# Patient Record
Sex: Female | Born: 1960 | Race: White | Hispanic: No | Marital: Married | Smoking: Never smoker
Health system: Southern US, Community
[De-identification: ages and names within clinical notes are randomized; demographics above are authoritative.]

## PROBLEM LIST (undated history)

## (undated) DIAGNOSIS — F419 Anxiety disorder, unspecified: Secondary | ICD-10-CM

## (undated) DIAGNOSIS — E039 Hypothyroidism, unspecified: Secondary | ICD-10-CM

## (undated) DIAGNOSIS — E611 Iron deficiency: Secondary | ICD-10-CM

## (undated) DIAGNOSIS — I4729 Other ventricular tachycardia: Secondary | ICD-10-CM

## (undated) DIAGNOSIS — G459 Transient cerebral ischemic attack, unspecified: Secondary | ICD-10-CM

## (undated) DIAGNOSIS — E785 Hyperlipidemia, unspecified: Secondary | ICD-10-CM

## (undated) DIAGNOSIS — I10 Essential (primary) hypertension: Secondary | ICD-10-CM

## (undated) DIAGNOSIS — D649 Anemia, unspecified: Secondary | ICD-10-CM

## (undated) DIAGNOSIS — R918 Other nonspecific abnormal finding of lung field: Secondary | ICD-10-CM

## (undated) DIAGNOSIS — I639 Cerebral infarction, unspecified: Secondary | ICD-10-CM

## (undated) DIAGNOSIS — K219 Gastro-esophageal reflux disease without esophagitis: Secondary | ICD-10-CM

## (undated) DIAGNOSIS — F329 Major depressive disorder, single episode, unspecified: Secondary | ICD-10-CM

## (undated) DIAGNOSIS — E0500 Thyrotoxicosis with diffuse goiter without thyrotoxic crisis or storm: Secondary | ICD-10-CM

## (undated) DIAGNOSIS — K449 Diaphragmatic hernia without obstruction or gangrene: Secondary | ICD-10-CM

## (undated) DIAGNOSIS — I4891 Unspecified atrial fibrillation: Secondary | ICD-10-CM

## (undated) DIAGNOSIS — I472 Ventricular tachycardia: Secondary | ICD-10-CM

## (undated) DIAGNOSIS — F32A Depression, unspecified: Secondary | ICD-10-CM

## (undated) HISTORY — DX: Cerebral infarction, unspecified: I63.9

## (undated) HISTORY — DX: Essential (primary) hypertension: I10

## (undated) HISTORY — DX: Depression, unspecified: F32.A

## (undated) HISTORY — DX: Anxiety disorder, unspecified: F41.9

## (undated) HISTORY — DX: Gastro-esophageal reflux disease without esophagitis: K21.9

## (undated) HISTORY — PX: CARPAL TUNNEL RELEASE: SHX101

## (undated) HISTORY — DX: Hyperlipidemia, unspecified: E78.5

## (undated) HISTORY — DX: Unspecified atrial fibrillation: I48.91

---
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
## 1978-11-06 HISTORY — PX: WISDOM TOOTH EXTRACTION: SHX21

## 1989-11-06 HISTORY — PX: CARPAL TUNNEL RELEASE: SHX101

## 1998-06-07 ENCOUNTER — Other Ambulatory Visit: Admission: RE | Admit: 1998-06-07 | Discharge: 1998-06-07 | Payer: Self-pay | Admitting: Obstetrics and Gynecology

## 1999-09-12 ENCOUNTER — Other Ambulatory Visit: Admission: RE | Admit: 1999-09-12 | Discharge: 1999-09-12 | Payer: Self-pay | Admitting: Obstetrics and Gynecology

## 2000-11-13 ENCOUNTER — Other Ambulatory Visit: Admission: RE | Admit: 2000-11-13 | Discharge: 2000-11-13 | Payer: Self-pay | Admitting: Obstetrics and Gynecology

## 2001-05-29 ENCOUNTER — Other Ambulatory Visit: Admission: RE | Admit: 2001-05-29 | Discharge: 2001-05-29 | Payer: Self-pay | Admitting: Obstetrics and Gynecology

## 2001-11-25 ENCOUNTER — Other Ambulatory Visit: Admission: RE | Admit: 2001-11-25 | Discharge: 2001-11-25 | Payer: Self-pay | Admitting: Obstetrics and Gynecology

## 2003-01-20 ENCOUNTER — Other Ambulatory Visit: Admission: RE | Admit: 2003-01-20 | Discharge: 2003-01-20 | Payer: Self-pay | Admitting: Obstetrics and Gynecology

## 2003-04-09 ENCOUNTER — Ambulatory Visit (HOSPITAL_COMMUNITY): Admission: RE | Admit: 2003-04-09 | Discharge: 2003-04-09 | Payer: Self-pay | Admitting: Obstetrics and Gynecology

## 2003-04-09 ENCOUNTER — Encounter (INDEPENDENT_AMBULATORY_CARE_PROVIDER_SITE_OTHER): Payer: Self-pay

## 2004-04-18 ENCOUNTER — Other Ambulatory Visit: Admission: RE | Admit: 2004-04-18 | Discharge: 2004-04-18 | Payer: Self-pay | Admitting: Obstetrics and Gynecology

## 2005-06-07 ENCOUNTER — Other Ambulatory Visit: Admission: RE | Admit: 2005-06-07 | Discharge: 2005-06-07 | Payer: Self-pay | Admitting: Obstetrics and Gynecology

## 2005-06-08 ENCOUNTER — Other Ambulatory Visit: Admission: RE | Admit: 2005-06-08 | Discharge: 2005-06-08 | Payer: Self-pay | Admitting: Obstetrics and Gynecology

## 2008-09-14 ENCOUNTER — Encounter (INDEPENDENT_AMBULATORY_CARE_PROVIDER_SITE_OTHER): Payer: Self-pay | Admitting: *Deleted

## 2008-10-06 ENCOUNTER — Ambulatory Visit: Payer: Self-pay | Admitting: Family Medicine

## 2008-10-06 DIAGNOSIS — Z8679 Personal history of other diseases of the circulatory system: Secondary | ICD-10-CM | POA: Insufficient documentation

## 2008-10-06 DIAGNOSIS — O99810 Abnormal glucose complicating pregnancy: Secondary | ICD-10-CM

## 2008-10-06 DIAGNOSIS — F419 Anxiety disorder, unspecified: Secondary | ICD-10-CM

## 2008-10-07 ENCOUNTER — Encounter: Payer: Self-pay | Admitting: Family Medicine

## 2008-10-27 ENCOUNTER — Ambulatory Visit: Payer: Self-pay | Admitting: Family Medicine

## 2008-10-27 LAB — CONVERTED CEMR LAB
Bilirubin Urine: NEGATIVE
Glucose, Urine, Semiquant: NEGATIVE
Ketones, urine, test strip: NEGATIVE
Nitrite: NEGATIVE
Protein, U semiquant: NEGATIVE
Specific Gravity, Urine: 1.01
Urobilinogen, UA: 0.2
WBC Urine, dipstick: NEGATIVE
pH: 5

## 2008-10-29 LAB — CONVERTED CEMR LAB
ALT: 18 units/L (ref 0–35)
AST: 16 units/L (ref 0–37)
Albumin: 3.6 g/dL (ref 3.5–5.2)
Alkaline Phosphatase: 82 units/L (ref 39–117)
BUN: 11 mg/dL (ref 6–23)
Basophils Absolute: 0 10*3/uL (ref 0.0–0.1)
Basophils Relative: 0.4 % (ref 0.0–3.0)
Bilirubin, Direct: 0.1 mg/dL (ref 0.0–0.3)
CO2: 28 meq/L (ref 19–32)
Calcium: 8.9 mg/dL (ref 8.4–10.5)
Chloride: 106 meq/L (ref 96–112)
Cholesterol: 199 mg/dL (ref 0–200)
Creatinine, Ser: 0.7 mg/dL (ref 0.4–1.2)
Eosinophils Absolute: 0.1 10*3/uL (ref 0.0–0.7)
Eosinophils Relative: 1.8 % (ref 0.0–5.0)
GFR calc Af Amer: 114 mL/min
GFR calc non Af Amer: 95 mL/min
Glucose, Bld: 95 mg/dL (ref 70–99)
HCT: 36.7 % (ref 36.0–46.0)
HDL: 30.1 mg/dL — ABNORMAL LOW (ref >39.0)
Hemoglobin: 12.7 g/dL (ref 12.0–15.0)
LDL Cholesterol: 135 mg/dL — ABNORMAL HIGH (ref 0–99)
Lymphocytes Relative: 22.5 % (ref 12.0–46.0)
MCHC: 34.6 g/dL (ref 30.0–36.0)
MCV: 89.6 fL (ref 78.0–100.0)
Monocytes Absolute: 0.5 10*3/uL (ref 0.1–1.0)
Monocytes Relative: 7.8 % (ref 3.0–12.0)
Neutro Abs: 4.7 10*3/uL (ref 1.4–7.7)
Neutrophils Relative %: 67.5 % (ref 43.0–77.0)
Platelets: 273 10*3/uL (ref 150–400)
Potassium: 3.7 meq/L (ref 3.5–5.1)
RBC: 4.1 M/uL (ref 3.87–5.11)
RDW: 12.4 % (ref 11.5–14.6)
Sodium: 141 meq/L (ref 135–145)
TSH: 2.67 microintl units/mL (ref 0.35–5.50)
Total Bilirubin: 0.6 mg/dL (ref 0.3–1.2)
Total CHOL/HDL Ratio: 6.6
Total Protein: 6.8 g/dL (ref 6.0–8.3)
Triglycerides: 168 mg/dL — ABNORMAL HIGH (ref 0–149)
VLDL: 34 mg/dL (ref 0–40)
WBC: 6.8 10*3/uL (ref 4.5–10.5)

## 2008-11-02 ENCOUNTER — Encounter (INDEPENDENT_AMBULATORY_CARE_PROVIDER_SITE_OTHER): Payer: Self-pay | Admitting: *Deleted

## 2009-03-26 ENCOUNTER — Telehealth (INDEPENDENT_AMBULATORY_CARE_PROVIDER_SITE_OTHER): Payer: Self-pay | Admitting: *Deleted

## 2009-06-02 ENCOUNTER — Ambulatory Visit: Payer: Self-pay | Admitting: Family Medicine

## 2009-06-06 LAB — CONVERTED CEMR LAB
ALT: 14 units/L (ref 0–35)
AST: 16 units/L (ref 0–37)
Albumin: 3.5 g/dL (ref 3.5–5.2)
Alkaline Phosphatase: 70 units/L (ref 39–117)
Bilirubin, Direct: 0.1 mg/dL (ref 0.0–0.3)
Cholesterol: 178 mg/dL (ref 0–200)
HDL: 27.5 mg/dL — ABNORMAL LOW (ref >39.00)
LDL Cholesterol: 134 mg/dL — ABNORMAL HIGH (ref 0–99)
Total Bilirubin: 0.8 mg/dL (ref 0.3–1.2)
Total CHOL/HDL Ratio: 6
Total Protein: 6.8 g/dL (ref 6.0–8.3)
Triglycerides: 81 mg/dL (ref 0.0–149.0)
VLDL: 16.2 mg/dL (ref 0.0–40.0)

## 2009-06-07 ENCOUNTER — Ambulatory Visit: Payer: Self-pay | Admitting: Family Medicine

## 2009-06-07 ENCOUNTER — Encounter (INDEPENDENT_AMBULATORY_CARE_PROVIDER_SITE_OTHER): Payer: Self-pay | Admitting: *Deleted

## 2009-06-07 DIAGNOSIS — E785 Hyperlipidemia, unspecified: Secondary | ICD-10-CM

## 2009-11-25 ENCOUNTER — Ambulatory Visit: Payer: Self-pay | Admitting: Family Medicine

## 2009-11-25 DIAGNOSIS — B373 Candidiasis of vulva and vagina: Secondary | ICD-10-CM

## 2009-11-25 LAB — CONVERTED CEMR LAB
Bilirubin Urine: NEGATIVE
Blood in Urine, dipstick: NEGATIVE
Glucose, Urine, Semiquant: NEGATIVE
Ketones, urine, test strip: NEGATIVE
Nitrite: NEGATIVE
Protein, U semiquant: NEGATIVE
Specific Gravity, Urine: 1.015
Urobilinogen, UA: 0.2
WBC Urine, dipstick: NEGATIVE
Whiff Test: NEGATIVE
pH: 5.5

## 2009-11-26 ENCOUNTER — Telehealth (INDEPENDENT_AMBULATORY_CARE_PROVIDER_SITE_OTHER): Payer: Self-pay | Admitting: *Deleted

## 2009-11-26 LAB — CONVERTED CEMR LAB
Chlamydia, DNA Probe: NEGATIVE
GC Probe Amp, Genital: NEGATIVE

## 2009-11-29 LAB — CONVERTED CEMR LAB

## 2009-12-06 LAB — CONVERTED CEMR LAB
Basophils Absolute: 0 10*3/uL (ref 0.0–0.1)
CO2: 24 meq/L (ref 19–32)
Calcium: 8.8 mg/dL (ref 8.4–10.5)
Eosinophils Absolute: 0.1 10*3/uL (ref 0.0–0.7)
GFR calc non Af Amer: 112.2 mL/min (ref >60)
HCT: 35.9 % — ABNORMAL LOW (ref 36.0–46.0)
Lymphs Abs: 1.1 10*3/uL (ref 0.7–4.0)
Monocytes Relative: 6.5 % (ref 3.0–12.0)
Platelets: 254 10*3/uL (ref 150.0–400.0)
RDW: 13.2 % (ref 11.5–14.6)
Sodium: 139 meq/L (ref 135–145)

## 2010-02-25 ENCOUNTER — Encounter: Admission: RE | Admit: 2010-02-25 | Discharge: 2010-02-25 | Payer: Self-pay | Admitting: Obstetrics and Gynecology

## 2010-04-27 ENCOUNTER — Ambulatory Visit: Payer: Self-pay | Admitting: Internal Medicine

## 2010-04-27 DIAGNOSIS — M545 Low back pain: Secondary | ICD-10-CM | POA: Insufficient documentation

## 2010-04-27 DIAGNOSIS — IMO0002 Reserved for concepts with insufficient information to code with codable children: Secondary | ICD-10-CM | POA: Insufficient documentation

## 2010-05-05 ENCOUNTER — Telehealth: Payer: Self-pay | Admitting: Family Medicine

## 2010-11-04 ENCOUNTER — Encounter: Payer: Self-pay | Admitting: Family Medicine

## 2010-11-04 ENCOUNTER — Telehealth: Payer: Self-pay | Admitting: Family Medicine

## 2010-11-06 HISTORY — PX: THYROID SURGERY: SHX805

## 2010-11-22 ENCOUNTER — Ambulatory Visit
Admission: RE | Admit: 2010-11-22 | Discharge: 2010-11-22 | Payer: Self-pay | Source: Home / Self Care | Attending: Family Medicine | Admitting: Family Medicine

## 2010-11-22 ENCOUNTER — Encounter: Payer: Self-pay | Admitting: Family Medicine

## 2010-11-22 DIAGNOSIS — D239 Other benign neoplasm of skin, unspecified: Secondary | ICD-10-CM | POA: Insufficient documentation

## 2010-11-23 ENCOUNTER — Encounter: Payer: Self-pay | Admitting: Family Medicine

## 2010-11-23 ENCOUNTER — Ambulatory Visit
Admission: RE | Admit: 2010-11-23 | Discharge: 2010-11-23 | Payer: Self-pay | Source: Home / Self Care | Attending: Family Medicine | Admitting: Family Medicine

## 2010-11-23 ENCOUNTER — Encounter (INDEPENDENT_AMBULATORY_CARE_PROVIDER_SITE_OTHER): Payer: Self-pay | Admitting: *Deleted

## 2010-11-23 ENCOUNTER — Other Ambulatory Visit: Payer: Self-pay | Admitting: Family Medicine

## 2010-11-23 LAB — CBC WITH DIFFERENTIAL/PLATELET
Basophils Absolute: 0 10*3/uL (ref 0.0–0.1)
Basophils Relative: 0.4 % (ref 0.0–3.0)
Eosinophils Absolute: 0 10*3/uL (ref 0.0–0.7)
Eosinophils Relative: 0.8 % (ref 0.0–5.0)
HCT: 35.4 % — ABNORMAL LOW (ref 36.0–46.0)
Hemoglobin: 12 g/dL (ref 12.0–15.0)
Lymphocytes Relative: 18 % (ref 12.0–46.0)
Lymphs Abs: 1.1 10*3/uL (ref 0.7–4.0)
MCHC: 33.9 g/dL (ref 30.0–36.0)
MCV: 89.4 fl (ref 78.0–100.0)
Monocytes Absolute: 0.4 10*3/uL (ref 0.1–1.0)
Monocytes Relative: 6.8 % (ref 3.0–12.0)
Neutro Abs: 4.5 10*3/uL (ref 1.4–7.7)
Neutrophils Relative %: 74 % (ref 43.0–77.0)
Platelets: 252 10*3/uL (ref 150.0–400.0)
RBC: 3.96 Mil/uL (ref 3.87–5.11)
RDW: 14 % (ref 11.5–14.6)
WBC: 6.1 10*3/uL (ref 4.5–10.5)

## 2010-11-23 LAB — HEPATIC FUNCTION PANEL
ALT: 12 U/L (ref 0–35)
AST: 13 U/L (ref 0–37)
Albumin: 3.7 g/dL (ref 3.5–5.2)
Alkaline Phosphatase: 64 U/L (ref 39–117)
Bilirubin, Direct: 0.1 mg/dL (ref 0.0–0.3)
Total Bilirubin: 0.4 mg/dL (ref 0.3–1.2)
Total Protein: 6.6 g/dL (ref 6.0–8.3)

## 2010-11-23 LAB — BASIC METABOLIC PANEL
BUN: 9 mg/dL (ref 6–23)
CO2: 26 mEq/L (ref 19–32)
Calcium: 9.2 mg/dL (ref 8.4–10.5)
Chloride: 106 mEq/L (ref 96–112)
Creatinine, Ser: 0.7 mg/dL (ref 0.4–1.2)
GFR: 100.12 mL/min (ref >60.00)
Glucose, Bld: 93 mg/dL (ref 70–99)
Potassium: 4 mEq/L (ref 3.5–5.1)
Sodium: 140 mEq/L (ref 135–145)

## 2010-11-23 LAB — LIPID PANEL
Cholesterol: 179 mg/dL (ref 0–200)
HDL: 34 mg/dL — ABNORMAL LOW (ref >39.00)
LDL Cholesterol: 130 mg/dL — ABNORMAL HIGH (ref 0–99)
Total CHOL/HDL Ratio: 5
Triglycerides: 76 mg/dL (ref 0.0–149.0)
VLDL: 15.2 mg/dL (ref 0.0–40.0)

## 2010-11-23 LAB — TSH: TSH: 1.88 u[IU]/mL (ref 0.35–5.50)

## 2010-11-25 ENCOUNTER — Telehealth (INDEPENDENT_AMBULATORY_CARE_PROVIDER_SITE_OTHER): Payer: Self-pay | Admitting: *Deleted

## 2010-11-25 LAB — CONVERTED CEMR LAB: HIV: NONREACTIVE

## 2010-11-28 ENCOUNTER — Telehealth: Payer: Self-pay | Admitting: Family Medicine

## 2010-12-06 NOTE — Assessment & Plan Note (Signed)
Summary: yeast infection//fd   Vital Signs:  Patient profile:   50 year old female Weight:      174 pounds Temp:     98.4 degrees F oral Pulse rate:   74 / minute Pulse rhythm:   regular BP sitting:   122 / 80  (left arm) Cuff size:   regular  Vitals Entered By: Army Fossa CMA (November 25, 2009 9:37 AM) CC: Pt c/o possible yeast infection, has tried [employer]. , Vaginal discharge   History of Present Illness:  Vaginal discharge      This is a 50 year old woman who presents with Vaginal discharge.  The symptoms began 2 weeks ago.  Pt recently became sexually active after several years.   + ext errythema and itching.   Pt used monistat3 x 2 .  The patient complains of itching and vaginal burning, but denies burning on urination, frequency, urgency, fever, pelvic pain, and back pain.  The discharge is described as white.  Risk factors for vaginal discharge include possible STD exposure.  The patient denies the following symptoms: genital sores, unusual vaginal bleeding, painful intercourse, rash, myalgias, arthralgias, and headache.  Prior treatment has included OTC antifungals.    Current Medications (verified): 1)  Alprazolam 0.5 Mg Tabs (Alprazolam) .... Take 1/2 or 1 Tab By Mouth Two Times A Day As Needed 2)  Celexa 10 Mg Tabs (Citalopram Hydrobromide) .Marland Kitchen.. 1 By Mouth Once Daily 3)  Diflucan 150 Mg Tabs (Fluconazole) .Marland Kitchen.. 1 By Mouth X1 ,  May Repeat in 3 Days As Needed 4)  Metrogel-Vaginal 0.75 % Gel (Metronidazole) .Marland Kitchen.. 1 Applicator At Bedtime 5)  Loestrin 24 Fe 1-20 Mg-Mcg Tabs (Norethin Ace-Eth Estrad-Fe)  Allergies (verified): No Known Drug Allergies  Past History:  Family History: Last updated: 10/06/2008 DM --Maternal GM sister CAD-DAD,PGF,PATERNAL UNCLE Family History High cholesterol-MOTHER Family History of CAD Female 1st degree relative <50 F--a fib  Social History: Last updated: 10/06/2008 Occupation: HS teacher Divorced Never Smoked Alcohol use-yes Drug  use-no Regular exercise-no  Risk Factors: Alcohol Use: <1 (10/06/2008) Caffeine Use: 1 (10/06/2008) Exercise: no (10/06/2008)  Risk Factors: Smoking Status: never (10/06/2008) Passive Smoke Exposure: no (10/06/2008)  Past medical, surgical, family and social histories (including risk factors) reviewed for relevance to current acute and chronic problems.  Past Medical History: Reviewed history from 06/07/2009 and no changes required. Anxiety GDM Hyperlipidemia  Past Surgical History: Reviewed history from 10/06/2008 and no changes required. Carpal tunnel release  Family History: Reviewed history from 10/06/2008 and no changes required. DM --Maternal GM sister CAD-DAD,PGF,PATERNAL UNCLE Family History High cholesterol-MOTHER Family History of CAD Female 1st degree relative <50 F--a fib  Social History: Reviewed history from 10/06/2008 and no changes required. Occupation: HS teacher Divorced Never Smoked Alcohol use-yes Drug use-no Regular exercise-no  Review of Systems      See HPI  Physical Exam  General:  Well-developed,well-nourished,in no acute distress; alert,appropriate and cooperative throughout examination Abdomen:  Bowel sounds positive,abdomen soft and non-tender without masses, organomegaly or hernias noted. Genitalia:  labial erythema and vaginal discharge.   Psych:  Oriented X3 and normally interactive.     Impression & Recommendations:  Problem # 1:  CANDIDIASIS OF VULVA AND VAGINA (ICD-112.1)  Her updated medication list for this problem includes:    Diflucan 150 Mg Tabs (Fluconazole) .Marland Kitchen... 1 by mouth x1 ,  may repeat in 3 days as needed  Orders: Wet Prep (47829FA) T-Chlamydia Probe, genital (21308-65784) T-GC Probe, genital (69629-52841) Venipuncture (32440) TLB-CBC Platelet -  w/Differential (85025-CBCD) TLB-BMP (Basic Metabolic Panel-BMET) (80048-METABOL) T-HIV Antibody  (Reflex) [id number]) T-RPR (Syphilis) [phone]) T- * Misc.  Laboratory test [phone])  Discussed treatment regimen and preventive measures.   Complete Medication List: 1)  Alprazolam 0.5 Mg Tabs (Alprazolam) .... Take 1/2 or 1 tab by mouth two times a day as needed 2)  Celexa 10 Mg Tabs (Citalopram hydrobromide) .Marland Kitchen.. 1 by mouth once daily 3)  Diflucan 150 Mg Tabs (Fluconazole) .Marland Kitchen.. 1 by mouth x1 ,  may repeat in 3 days as needed 4)  Metrogel-vaginal 0.75 % Gel (Metronidazole) .Marland Kitchen.. 1 applicator at bedtime 5)  Loestrin 24 Fe 1-20 Mg-mcg Tabs (Norethin ace-eth estrad-fe) Prescriptions: METROGEL-VAGINAL 0.75 % GEL (METRONIDAZOLE) 1 applicator at bedtime  #5 x 0   Entered and Authorized by:   Loreen Freud DO   Signed by:   Loreen Freud DO on 11/25/2009   Method used:   Print then Give to Patient   RxID:   [id number] DIFLUCAN 150 MG TABS (FLUCONAZOLE) 1 by mouth x1 ,  May repeat in 3 days as needed  #2 x 3   Entered and Authorized by:   Loreen Freud DO   Signed by:   Loreen Freud DO on 11/25/2009   Method used:   [facility]        [facility]  [address] [phone][facility])       [address]       [place], [place]  [address]       Ph: [phone] or [phone]       Fax: [phone]   RxID:   [id number]   Laboratory Results   Urine Tests    Routine Urinalysis   Color: yellow Appearance: Clear Glucose: negative   (Normal Range: Negative) Bilirubin: negative   (Normal Range: Negative) Ketone: negative   (Normal Range: Negative) Spec. Gravity: 1.015   (Normal Range: 1.003-1.035) Blood: negative   (Normal Range: Negative) pH: 5.5   (Normal Range: 5.0-8.0) Protein: negative   (Normal Range: Negative) Urobilinogen: 0.2   (Normal Range: 0-1) Nitrite: negative   (Normal Range: Negative) Leukocyte Esterace: negative   (Normal Range: Negative)    CommentsArmy Fossa CMA  November 25, 2009 9:44 AM Date/Time Received: November 25, 2009 10:05 AM  Date/Time Reported: November 25, 2009 10:05 AM   Wet [place] Source:  vaginal WBC/hpf: 1-5 Bacteria/hpf: rare Clue cells/hpf: few  Negative whiff Yeast/hpf: moderate Trichomonas/hpf: none

## 2010-12-06 NOTE — Progress Notes (Signed)
Summary: lab results  Phone Note Outgoing Call   Call placed by: [facility] CMA,  November 26, 2009 10:11 AM Summary of Call: left message to call office. ...............Marland KitchenFelecia Deloach CMA  November 26, 2009 10:10 AM   HIV 1,2 Antibody, with Reflex,Chlamydia and GC Probe Amp, Genital: negative  pt aware.......................Marland KitchenFelecia Deloach CMA  November 26, 2009 3:58 PM

## 2010-12-06 NOTE — Progress Notes (Signed)
Summary: BACK PAIN MEDICATION  Phone Note Call from Patient Call back at [phone]   Caller: Patient Summary of Call: PT CALLS IN STATING THAT SHE SEEN DR. HOPPER LAST WEEK REGARDING HER BACK PAIN. SHE WAS GIVEN HYDROCODONE TO HELP HER BUT SHE IS NOT ABLE TO TAKE  IT DURING THE DAY BECAUSE SHE CAN NOT FUNCTION WHILE SHES ON THE MEDICATION. SHE SAYS IT MAKES HER VERY TIRED. IS THERE SOMETHING ELSE THAT CAN BE CALLED IN FOR HER? SHE WOULD LIKE TO KNOW IF IT IS POSSIBLE FOR HER TO TRY VOLTAREN? PLEASE CONTACT PT AT [phone] Initial call taken by: Lavell Islam,  May 05, 2010 12:06 PM  Follow-up for Phone Call        dr Beverely Low please advise on alternate med....Marland KitchenMarland KitchenFelecia Deloach CMA  May 05, 2010 12:35 PM   Additional Follow-up for Phone Call Additional follow up Details #1::        ok for voltaren 50mg  two times a day x7-10 days and then as needed.  #30, no refills.  no additional ibuprofen, motrin, aleve, advil, etc- can add tylenol as needed. Additional Follow-up by: Neena Rhymes MD,  May 05, 2010 1:24 PM    Additional Follow-up for Phone Call Additional follow up Details #2::    Spoke with pt, she is aware. Army Fossa CMA  May 05, 2010 2:03 PM   New/Updated Medications: DICLOFENAC SODIUM 50 MG TBEC (DICLOFENAC SODIUM) two times a day x7-10 days and then as needed. Prescriptions: DICLOFENAC SODIUM 50 MG TBEC (DICLOFENAC SODIUM) two times a day x7-10 days and then as needed.  #30 x 0   Entered by:   Army Fossa CMA   Authorized by:   Neena Rhymes MD   Signed by:   Army Fossa CMA on 05/05/2010   Method used:   [facility]        [facility]  [address] [phone][facility] (retail[facility]       [address]       [place], [place]  [address]       Ph: [phone] or [phone]       Fax: [phone]   RxID:   [id number]

## 2010-12-06 NOTE — Assessment & Plan Note (Signed)
Summary: back pain/cbs   Vital Signs:  Patient profile:   50 year old female Height:      68 inches Weight:      181.25 pounds BMI:     27.66 Temp:     98.8 degrees F Pulse rate:   88 / minute Pulse rhythm:   regular Resp:     16 per minute BP sitting:   128 / 80  (left arm) Cuff size:   regular  Vitals Entered By: Army Fossa CMA (April 27, 2010 1:47 PM) CC: Pt here she picked up a 35 lb bag of dog food last week and hurt her lower back and it is radiating down into her leg. Has been icing and using ibruprofen., Back pain   Primary Care Provider:  Laury Axon  CC:  Pt here she picked up a 35 lb bag of dog food last week and hurt her lower back and it is radiating down into her leg. Has been icing and using ibruprofen. and Back pain.  History of Present Illness:  Back Pain      This is a 50 year old woman who presents with Back pain X 5 days.  The patient reports  pain even @ rest, but denies fever, chills, weakness, loss of sensation, fecal incontinence, urinary incontinence, urinary retention, dysuria, and inability to care for self.  The pain is located in the mid- L  low back.  The pain began at home, suddenly, and after lifting a 35# bag of dogfood.  The pain radiates to the left hip/thigh.  The pain is made worse by standing or walking and flexion.  The pain is made better by inactivity, NSAID medications,  ice & Chiropractry (3 visits).  Risk factors for serious underlying conditions include age >= 50 years and history of scoliosis.    Allergies (verified): No Known Drug Allergies  Review of Systems General:  Denies chills and sweats. GU:  Denies discharge; Having menses. Derm:  Denies lesion(s) and rash. Neuro:  Denies brief paralysis, numbness, tingling, and weakness. Heme:  Denies abnormal bruising and bleeding.  Physical Exam  General:  Uncomfortable but in no acute distress; alert,appropriate and cooperative throughout examination Abdomen:  Bowel sounds  positive,abdomen soft and non-tender without masses, organomegaly or hernias noted. Msk:  No deformity or scoliosis noted of thoracic or lumbar spine.  Classic "low back" crawl sitting up & lying down . No pain to percussion Pulses:  R and L dorsalis pedis and posterior tibial pulses are full and equal bilaterally Extremities:  No clubbing, cyanosis, edema, or deformity noted with normal full range of motion of all joints.  Neg SLR  Neurologic:  alert & oriented X3, strength normal in all extremities, gait(heel/toe) normal, and DTRs symmetrical and normal.   Skin:  Intact without suspicious lesions or rashes Psych:  memory intact for recent and remote, normally interactive, and good eye contact.     Impression & Recommendations:  Problem # 1:  LOW BACK PAIN, ACUTE (ICD-724.2)  Her updated medication list for this problem includes:    Cyclobenzaprine Hcl 5 Mg Tabs (Cyclobenzaprine hcl) .Marland Kitchen... 1 two times a day & 1-2 at bedtime as needed    Hydrocodone-acetaminophen 5-500 Mg Tabs (Hydrocodone-acetaminophen) .Marland Kitchen... 1-2 every 4-6 hrs as needed for pain  Problem # 2:  LUMBAR RADICULOPATHY, LEFT (ICD-724.4)   L4 distribution  Her updated medication list for this problem includes:    Cyclobenzaprine Hcl 5 Mg Tabs (Cyclobenzaprine hcl) .Marland Kitchen... 1 two times a  day & 1-2 at bedtime as needed    Hydrocodone-acetaminophen 5-500 Mg Tabs (Hydrocodone-acetaminophen) .Marland Kitchen... 1-2 every 4-6 hrs as needed for pain  Complete Medication List: 1)  Alprazolam 0.5 Mg Tabs (Alprazolam) .... Take 1/2 or 1 tab by mouth two times a day as needed 2)  Celexa 10 Mg Tabs (Citalopram hydrobromide) .Marland Kitchen.. 1 by mouth once daily 3)  Cyclobenzaprine Hcl 5 Mg Tabs (Cyclobenzaprine hcl) .Marland Kitchen.. 1 two times a day & 1-2 at bedtime as needed 4)  Hydrocodone-acetaminophen 5-500 Mg Tabs (Hydrocodone-acetaminophen) .Marland Kitchen.. 1-2 every 4-6 hrs as needed for pain  Patient Instructions: 1)  Meds may cause sleepiness. Stretching exercises as  discussed Prescriptions: HYDROCODONE-ACETAMINOPHEN 5-500 MG TABS (HYDROCODONE-ACETAMINOPHEN) 1-2 every 4-6 hrs as needed for pain  #30 x 0   Entered and Authorized by:   Marga Melnick MD   Signed by:   Marga Melnick MD on 04/27/2010   Method used:   Print then Give to Patient   RxID:   [id number] CYCLOBENZAPRINE HCL 5 MG TABS (CYCLOBENZAPRINE HCL) 1 two times a day & 1-2 at bedtime as needed  #20 x 0   Entered and Authorized by:   Marga Melnick MD   Signed by:   Marga Melnick MD on 04/27/2010   Method used:   Print then Give to Patient   RxID:   [id number]

## 2010-12-08 NOTE — Progress Notes (Signed)
Summary: Results   Phone Note Outgoing Call   Call placed by: Almeta Monas CMA Duncan Dull),  November 25, 2010 1:25 PM Call placed to: Patient Summary of Call: All STD labs ----wnl. rest of the labs are pending...... Left message to call back... Almeta Monas CMA Duncan Dull)  November 25, 2010 1:26 PM  Left message to call back.... Almeta Monas CMA Duncan Dull)  November 28, 2010 11:12 AM   Follow-up for Phone Call        Pt aware............Marland KitchenFelecia Deloach CMA  November 28, 2010 4:55 PM

## 2010-12-08 NOTE — Progress Notes (Signed)
Summary: DERMATOLOGY REFERRAL  Phone Note Call from Patient   Caller: Patient Call For: Loreen Freud DO Reason for Call: Referral Summary of Call: IN REFERENCE TO DERMATOLOGY REFERRAL, PER VOICEMAIL FROM PT, SHE ASKED I CXL THIS APPT DUE TO FINANCIAL REASONS, I HAVE CXLED Alexandria [name]  November 28, 2010 1:53 PM

## 2010-12-08 NOTE — Letter (Signed)
Summary: Pre Visit Letter Revised  [facility]  [address] [place], [place] [address]   Phone: [phone]  Fax: [phone]        11/23/2010 MRN: [id number] Alexandria [name] [address] [place], [place]  [address]             Procedure Date:  December 28, 2010   Dir Col-Dr Tomasita Morrow to the Gastroenterology Division at [facility].    You are scheduled to see a nurse for your pre-procedure visit on Februaryu 8, 2012 at 10:30am on the 3rd floor at [facility], [address]  We ask that you try to arrive at our office 15 minutes prior to your appointment time to allow for check-in.  Please take a minute to review the attached form.  If you answer "Yes" to one or more of the questions on the first page, we ask that you call the person listed at your earliest opportunity.  If you answer "No" to all of the questions, please complete the rest of the form and bring it to your appointment.    Your nurse visit will consist of discussing your medical and surgical history, your immediate family medical history, and your medications.   If you are unable to list all of your medications on the form, please bring the medication bottles to your appointment and we will list them.  We will need to be aware of both prescribed and over the counter drugs.  We will need to know exact dosage information as well.    Please be prepared to read and sign documents such as consent forms, a financial agreement, and acknowledgement forms.  If necessary, and with your consent, a friend or relative is welcome to sit-in on the nurse visit with you.  Please bring your insurance card so that we may make a copy of it.  If your insurance requires a referral to see a specialist, please bring your referral form from your primary care physician.  No co-pay is required for this nurse visit.     If you cannot keep your appointment, please call [phone] to cancel or  reschedule prior to your appointment date.  This allows [place] the opportunity to schedule an appointment for another patient in need of care.    Thank you for choosing [facility] for your medical needs.  We appreciate the opportunity to care for you.  Please visit [place] at our website  to learn more about our practice.  Sincerely, The Gastroenterology Division

## 2010-12-08 NOTE — Letter (Signed)
Summary: Primary Care Appointment Letter  [facility]  [address] [place], [place] [address]   Phone: [phone]  Fax: [phone]    11/04/2010 MRN: [id number]    Alexandria [name] [address] [place], [place]  [address]     Dear Ms. BECK,   Your Primary Care Physician Loreen Freud DO has indicated that:    ___X____it is time to schedule an appointment for you Annual physical with fasting Labs.    _______you missed your appointment on______ and need to call and          reschedule.    _______you need to have lab work done.    _______you need to schedule an appointment discuss lab or test results.    _______you need to call to reschedule your appointment that is                       scheduled on _________.     Please call our office as soon as possible. Our phone number is [phone]. Please press option 1. Our office is open 8a-5p, Monday through Friday.     Thank you,    [facility] Scheduler

## 2010-12-08 NOTE — Assessment & Plan Note (Signed)
Summary: cpx//lch   Vital Signs:  Patient profile:   50 year old female Height:      66.75 inches Weight:      169.8 pounds Pulse rate:   60 / minute Pulse rhythm:   regular BP sitting:   130 / 80 Cuff size:   regular  Vitals Entered By: Almeta Monas CMA Duncan Dull) (November 22, 2010 2:14 PM) CC: CPX/No pap needed--wants to discuss meds   History of Present Illness: Pt here for cpe.  Pt feels the celexa is no longer working.  She has just been through bad breakup and would like to be checked for STDs as well.     Preventive Screening-Counseling & Management  Alcohol-Tobacco     Alcohol drinks/day: <1     Alcohol type: WINE     Smoking Status: never     Passive Smoke Exposure: no  Caffeine-Diet-Exercise     Caffeine use/day: 1     Does Patient Exercise: no     Exercise Counseling: not indicated; exercise is adequate  Hep-HIV-STD-Contraception     HIV Risk: no     Sun Exposure-Excessive: occasionally  Safety-Violence-Falls     Seat Belt Use: 100      Sexual History:  single.    Problems Prior to Update: 1)  Preventive Health Care  (ICD-V70.0) 2)  Nevi, Multiple  (ICD-216.9) 3)  Preventive Health Care  (ICD-V70.0) 4)  Sexual Activity, High Risk  (ICD-V69.2) 5)  Lumbar Radiculopathy, Left  (ICD-724.4) 6)  Low Back Pain, Acute  (ICD-724.2) 7)  Candidiasis of Vulva and Vagina  (ICD-112.1) 8)  Hyperlipidemia  (ICD-272.4) 9)  Palpitations, Hx of  (ICD-V12.50) 10)  Family History of Cad Female 1st Degree Relative <50  (ICD-V17.3) 11)  Gestational Diabetes  (ICD-648.80) 12)  Anxiety  (ICD-300.00)  Medications Prior to Update: 1)  Alprazolam 0.5 Mg Tabs (Alprazolam) .... Take 1/2 or 1 Tab By Mouth Two Times A Day As Needed 2)  Celexa 10 Mg Tabs (Citalopram Hydrobromide) .Marland Kitchen.. 1 By Mouth Once Daily 3)  Cyclobenzaprine Hcl 5 Mg Tabs (Cyclobenzaprine Hcl) .Marland Kitchen.. 1 Two Times A Day & 1-2 At Bedtime As Needed 4)  Hydrocodone-Acetaminophen 5-500 Mg Tabs (Hydrocodone-Acetaminophen)  .Marland Kitchen.. 1-2 Every 4-6 Hrs As Needed For Pain 5)  Diclofenac Sodium 50 Mg Tbec (Diclofenac Sodium) .... Two Times A Day X7-10 Days and Then As Needed.  Current Medications (verified): 1)  Alprazolam 0.5 Mg Tabs (Alprazolam) .... Take 1/2 or 1 Tab By Mouth Two Times A Day As Needed 2)  Celexa 20 Mg Tabs (Citalopram Hydrobromide) .Marland Kitchen.. 1 By Mouth Once Daily 3)  Diclofenac Sodium 75 Mg Tbec (Diclofenac Sodium) .Marland Kitchen.. 1 By Mouth Two Times A Day As Needed  Allergies (verified): No Known Drug Allergies  Past History:  Past Medical History: Last updated: 06/07/2009 Anxiety GDM Hyperlipidemia  Past Surgical History: Last updated: 10/06/2008 Carpal tunnel release  Family History: Last updated: 10/06/2008 DM --Maternal GM sister CAD-DAD,PGF,PATERNAL UNCLE Family History High cholesterol-MOTHER Family History of CAD Female 1st degree relative <50 F--a fib  Social History: Last updated: 10/06/2008 Occupation: HS teacher Divorced Never Smoked Alcohol use-yes Drug use-no Regular exercise-no  Risk Factors: Alcohol Use: <1 (11/22/2010) Caffeine Use: 1 (11/22/2010) Exercise: no (11/22/2010)  Risk Factors: Smoking Status: never (11/22/2010) Passive Smoke Exposure: no (11/22/2010)  Family History: Reviewed history from 10/06/2008 and no changes required. DM --Maternal GM sister CAD-DAD,PGF,PATERNAL UNCLE Family History High cholesterol-MOTHER Family History of CAD Female 1st degree relative <50 F--a fib  Social  History: Reviewed history from 10/06/2008 and no changes required. Occupation: HS teacher Divorced Never Smoked Alcohol use-yes Drug use-no Regular exercise-no Sexual History:  single  Review of Systems      See HPI General:  Denies chills, fatigue, fever, loss of appetite, malaise, sleep disorder, sweats, weakness, and weight loss. Eyes:  Denies blurring, discharge, double vision, eye irritation, eye pain, halos, itching, light sensitivity, red eye, vision loss-1 eye,  and vision loss-both eyes; optho-- due. ENT:  Denies decreased hearing, difficulty swallowing, ear discharge, earache, hoarseness, nasal congestion, nosebleeds, postnasal drainage, ringing in ears, sinus pressure, and sore throat. CV:  Denies bluish discoloration of lips or nails, chest pain or discomfort, difficulty breathing at night, difficulty breathing while lying down, fainting, fatigue, leg cramps with exertion, lightheadness, near fainting, palpitations, shortness of breath with exertion, swelling of feet, swelling of hands, and weight gain. Resp:  Denies chest discomfort, chest pain with inspiration, cough, coughing up blood, excessive snoring, hypersomnolence, morning headaches, pleuritic, shortness of breath, sputum productive, and wheezing. GI:  Denies abdominal pain, bloody stools, change in bowel habits, constipation, dark tarry stools, diarrhea, excessive appetite, gas, hemorrhoids, indigestion, loss of appetite, nausea, vomiting, vomiting blood, and yellowish skin color. GU:  Denies abnormal vaginal bleeding, decreased libido, discharge, dysuria, genital sores, hematuria, incontinence, nocturia, urinary frequency, and urinary hesitancy. MS:  Denies joint pain, joint redness, joint swelling, loss of strength, low back pain, mid back pain, muscle aches, muscle , cramps, muscle weakness, stiffness, and thoracic pain. Derm:  Denies changes in color of skin, changes in nail beds, dryness, excessive perspiration, flushing, hair loss, insect bite(s), itching, lesion(s), poor wound healing, and rash. Neuro:  Denies brief paralysis, difficulty with concentration, disturbances in coordination, falling down, headaches, inability to speak, memory loss, numbness, poor balance, seizures, sensation of room spinning, tingling, tremors, visual disturbances, and weakness. Psych:  Complains of depression; denies alternate hallucination ( auditory/visual), anxiety, easily angered, easily tearful, irritability,  mental problems, panic attacks, sense of great danger, suicidal thoughts/plans, thoughts of violence, unusual visions or sounds, and thoughts /plans of harming others. Endo:  Denies cold intolerance, excessive hunger, excessive thirst, excessive urination, heat intolerance, polyuria, and weight change. Heme:  Denies abnormal bruising, bleeding, enlarge lymph nodes, fevers, pallor, and skin discoloration. Allergy:  Denies hives or rash, itching eyes, persistent infections, seasonal allergies, and sneezing.  Physical Exam  General:  Well-developed,well-nourished,in no acute distress; alert,appropriate and cooperative throughout examination Head:  Normocephalic and atraumatic without obvious abnormalities. No apparent alopecia or balding. Eyes:  pupils equal, pupils round, pupils reactive to light, and no injection.   Ears:  External ear exam shows no significant lesions or deformities.  Otoscopic examination reveals clear canals, tympanic membranes are intact bilaterally without bulging, retraction, inflammation or discharge. Hearing is grossly normal bilaterally. Nose:  External nasal examination shows no deformity or inflammation. Nasal mucosa are pink and moist without lesions or exudates. Mouth:  Oral mucosa and oropharynx without lesions or exudates.  Teeth in good repair. Neck:  No deformities, masses, or tenderness noted. Chest Wall:  No deformities, masses, or tenderness noted. Breasts:  No mass, nodules, thickening, tenderness, bulging, retraction, inflamation, nipple discharge or skin changes noted.   Lungs:  Normal respiratory effort, chest expands symmetrically. Lungs are clear to auscultation, no crackles or wheezes. Heart:  normal rate and no murmur.   Abdomen:  Bowel sounds positive,abdomen soft and non-tender without masses, organomegaly or hernias noted. Msk:  normal ROM, no joint tenderness, no joint swelling, no joint  warmth, no redness over joints, no joint deformities, no joint  instability, and no crepitation.   Pulses:  R posterior tibial normal, R dorsalis pedis normal, R carotid normal, L posterior tibial normal, L dorsalis pedis normal, and L carotid normal.   Extremities:  No clubbing, cyanosis, edema, or deformity noted with normal full range of motion of all joints.   Neurologic:  No cranial nerve deficits noted. Station and gait are normal. Plantar reflexes are down-going bilaterally. DTRs are symmetrical throughout. Sensory, motor and coordinative functions appear intact. Skin:  Intact without suspicious lesions or rashes Cervical Nodes:  No lymphadenopathy noted Axillary Nodes:  No palpable lymphadenopathy Psych:  Cognition and judgment appear intact. Alert and cooperative with normal attention span and concentration. No apparent delusions, illusions, hallucinations   Impression & Recommendations:  Problem # 1:  PREVENTIVE HEALTH CARE (ICD-V70.0) GHM utd  RTO labs  Orders: Gastroenterology Referral (GI) EKG w/ Interpretation (93000)  Problem # 2:  SEXUAL ACTIVITY, HIGH RISK (ICD-V69.2)  Orders: T-Chlamydia  Probe, urine (65784-69629) T-GC Probe, urine (52841-32440) EKG w/ Interpretation (93000)  Problem # 3:  ANXIETY (ICD-300.00)  Her updated medication list for this problem includes:    Alprazolam 0.5 Mg Tabs (Alprazolam) .Marland Kitchen... Take 1/2 or 1 tab by mouth two times a day as needed    Celexa 20 Mg Tabs (Citalopram hydrobromide) .Marland Kitchen... 1 by mouth once daily  Discussed medication use and relaxation techniques.   Orders: EKG w/ Interpretation (93000)  Problem # 4:  HYPERLIPIDEMIA (ICD-272.4)  Orders: EKG w/ Interpretation (93000)  Complete Medication List: 1)  Alprazolam 0.5 Mg Tabs (Alprazolam) .... Take 1/2 or 1 tab by mouth two times a day as needed 2)  Celexa 20 Mg Tabs (Citalopram hydrobromide) .Marland Kitchen.. 1 by mouth once daily 3)  Diclofenac Sodium 75 Mg Tbec (Diclofenac sodium) .Marland Kitchen.. 1 by mouth two times a day as needed  Other  Orders: Dermatology Referral (Derma)  Patient Instructions: 1)  V69.2  add HIV, RPR, HSV II glycoprotein to lab tomorrow Prescriptions: CELEXA 20 MG TABS (CITALOPRAM HYDROBROMIDE) 1 by mouth once daily  #30 x 0   Entered by:   Doristine Devoid CMA   Authorized by:   Loreen Freud DO   Signed by:   Doristine Devoid CMA on 11/22/2010   Method used:   [facility]        [facility]  [address] [phone]* (retail)       [address]       [place], [place]  [address]       Ph: [phone] or [phone]       Fax: [phone]   RxID:   [id number] CELEXA 20 MG TABS (CITALOPRAM HYDROBROMIDE) 1 by mouth once daily  #30 x 0   Entered and Authorized by:   Loreen Freud DO   Signed by:   Loreen Freud DO on 11/22/2010   Method used:   Historical   RxID:   [id number]    Orders Added: 1)  T-Chlamydia  Probe, urine [[id number]] 2)  T-GC Probe, urine [[id number]] 3)  Gastroenterology Referral [GI] 4)  Dermatology Referral [Derma] 5)  Est. Patient 40-64 years [99396] 6)  EKG w/ Interpretation [93000]     Last Flu Vaccine:  Fluvax 3+ (09/16/2008 3:24:41 PM) Flu Vaccine Next Due:  Refused PAP Result Date:  04/20/2010 PAP Result:  normal PAP Next Due:  1 yr Mammogram Result Date:  04/20/2010 Mammogram Result:  normal Mammogram Next Due:  1 yr

## 2010-12-08 NOTE — Progress Notes (Signed)
Summary: Alprazolam refill  Phone Note Refill Request Message from:  Fax from Pharmacy on November 04, 2010 4:22 PM  Refills Requested: Medication #1:  ALPRAZOLAM 0.5 MG TABS TAKE 1/2 OR 1 TAB by mouth two times a day as needed   Last Refilled: [facility]/10/2010 [id number], [address], [place], [place]  phone - [phone],   fax - [phone]   qty = 30  Next Appointment Scheduled: none Initial call taken by: Jerolyn Shin,  November 04, 2010 4:24 PM  Follow-up for Phone Call        last seen 04/27/10 for back pain, last cpx 12/09 and last filled 06/07/09, No upcoming appts. please advise Follow-up by: Almeta Monas CMA Duncan Dull),  November 04, 2010 4:39 PM  Additional Follow-up for Phone Call Additional follow up Details #1::        refill x1---pt needs ov cpe Additional Follow-up by: Loreen Freud DO,  November 04, 2010 4:41 PM    Prescriptions: ALPRAZOLAM 0.5 MG TABS (ALPRAZOLAM) TAKE 1/2 OR 1 TAB by mouth two times a day as needed  #30 x 0   Entered by:   Almeta Monas CMA (AAMA)   Authorized by:   Loreen Freud DO   Signed by:   Almeta Monas CMA (AAMA) on 11/04/2010   Method used:   Printed then faxed to ...         RxID:   [id number]

## 2010-12-28 ENCOUNTER — Other Ambulatory Visit: Payer: Self-pay | Admitting: Gastroenterology

## 2011-02-01 ENCOUNTER — Other Ambulatory Visit: Payer: Self-pay | Admitting: Family Medicine

## 2011-02-02 NOTE — Telephone Encounter (Signed)
Last seen and filled 11/22/10 please advise     KP

## 2011-03-24 NOTE — Op Note (Signed)
NAME:  Alexandria [name], Alexandria [name]                             ACCOUNT NO.:  [id number]   MEDICAL RECORD NO.:  [id number]                   PATIENT TYPE:  AMB   LOCATION:  [facility]                                  FACILITY:  [facility]   PHYSICIAN:  Malva Limes, M.D.                 DATE OF BIRTH:  May 16, 1961   DATE OF PROCEDURE:  04/09/2003  DATE OF DISCHARGE:                                 OPERATIVE REPORT   PREOPERATIVE DIAGNOSES:  1. Menorrhagia.  2. Known fibroid.   POSTOPERATIVE DIAGNOSES:  1. Menorrhagia.  2. Known fibroid.   PROCEDURES:  1. Hysteroscopy.  2. Dilation and curettage.  3. Cryoablation of endometrium.   SURGEON:  Malva Limes, M.D.   ANESTHESIA:  General.   ANTIBIOTICS:  Ancef 1 g.   DRAINS:  Red rubber catheter to the bladder.   SPECIMENS:  Endometrial curettings sent to pathology.   COMPLICATIONS:  None.   ESTIMATED BLOOD LOSS:  Minimal.   FINDINGS:  The patient had no evidence of a submucous fibroid.  The ostia  were visualized and freely mobile.  There was no evidence of any polyps.   PROCEDURE:  The patient was taken to the operating room and placed in the  dorsal supine position.  A general anesthetic was administered without  complications.  She was then placed in the dorsal lithotomy position.  She  was prepped with Hibiclens and draped in the usual fashion for this  procedure.  A sterile speculum was placed in the vagina.  A single-tooth  tenaculum was applied to the anterior cervical lip.  The cervix was then  dilated to a 31 [place].  The uterus was sounded to 11 cm.  The hysteroscope  was then advanced through the endocervical canal, which appeared to be  normal.  On examining the uterine cavity there was no evidence of polyps or  submucous fibroids.  Both ostia were easily seen.  The hysteroscope was then  removed and sharp curettage performed.  Following this the cryoablation  machine was set up and the cryoprobe was placed in the right cornu and  frozen for seven minutes.  The opposite side was then frozen for seven  minutes, and the lower uterine segment was frozen for  four minutes.  The patient tolerated the procedure well.  She was taken to  the recovery room in stable condition.  She will be discharged to home.  She  will be instructed to follow up in the office in four weeks.  She will be  sent home with Darvocet to take p.r.n.                                               Malva Limes, M.D.    MA/MEDQ  D:  04/09/2003  T:  04/10/2003  Job:  [id number]

## 2011-04-10 ENCOUNTER — Other Ambulatory Visit: Payer: Self-pay | Admitting: Family Medicine

## 2011-04-10 NOTE — Telephone Encounter (Signed)
It doesn't look like pt is taking it?  Please check with pt.

## 2011-04-10 NOTE — Telephone Encounter (Signed)
Last seen 11/22/10 and filled 02/01/11 # 30 with 0 refills--- Please advise    KP

## 2011-04-11 MED ORDER — CITALOPRAM HYDROBROMIDE 20 MG PO TABS: 20.0000 mg | ORAL_TABLET | Freq: Every day | ORAL | Status: DC

## 2011-04-11 NOTE — Telephone Encounter (Signed)
Ok to refill x1---- 5 refills 

## 2011-04-11 NOTE — Telephone Encounter (Signed)
Addended by: Arnette Norris on: 04/11/2011 03:09 PM   Modules accepted: Orders

## 2011-04-11 NOTE — Telephone Encounter (Signed)
Patient is cutting the Celexa 20mg  in half--she stated she is doing it that way because it is cheaper for her instead of filling 10 mg monthly. She stated she lost 25 lbs and did not want to gain the weight back so she did not want the increase that was discussed at the last visit. She stated she did try tking 20 mg but felt the 10 mg worked better for her.   Please advise    KP

## 2011-04-11 NOTE — Telephone Encounter (Signed)
Spoke with patient  And advised Rx approved if any concerns she can call the office and she agreed......Marland Kitchenprescription faxed     KP

## 2011-05-08 ENCOUNTER — Other Ambulatory Visit: Payer: Self-pay | Admitting: Obstetrics and Gynecology

## 2011-10-12 ENCOUNTER — Other Ambulatory Visit: Payer: Self-pay | Admitting: Family Medicine

## 2011-10-12 MED ORDER — ALPRAZOLAM 0.5 MG PO TABS: ORAL_TABLET | ORAL | Status: DC

## 2011-10-12 NOTE — Telephone Encounter (Signed)
Refill x1 Pt needs ov

## 2011-10-12 NOTE — Telephone Encounter (Signed)
Faxed and letter mailed to schedule apt    KP

## 2011-10-12 NOTE — Telephone Encounter (Signed)
Last seen 11/22/10  And filled 11/04/10. Please advise      KP

## 2011-11-27 ENCOUNTER — Ambulatory Visit (INDEPENDENT_AMBULATORY_CARE_PROVIDER_SITE_OTHER): Payer: BC Managed Care – PPO | Admitting: Family Medicine

## 2011-11-27 ENCOUNTER — Encounter: Payer: Self-pay | Admitting: Family Medicine

## 2011-11-27 VITALS — HR 97 | Temp 98.5°F | Ht 66.75 in | Wt 172.0 lb

## 2011-11-27 DIAGNOSIS — F419 Anxiety disorder, unspecified: Secondary | ICD-10-CM

## 2011-11-27 DIAGNOSIS — F411 Generalized anxiety disorder: Secondary | ICD-10-CM

## 2011-11-27 DIAGNOSIS — Z0000 Encounter for general adult medical examination without abnormal findings: Secondary | ICD-10-CM

## 2011-11-27 DIAGNOSIS — R7309 Other abnormal glucose: Secondary | ICD-10-CM

## 2011-11-27 DIAGNOSIS — E785 Hyperlipidemia, unspecified: Secondary | ICD-10-CM

## 2011-11-27 DIAGNOSIS — R739 Hyperglycemia, unspecified: Secondary | ICD-10-CM | POA: Insufficient documentation

## 2011-11-27 LAB — CBC WITH DIFFERENTIAL/PLATELET
Basophils Relative: 0.3 % (ref 0.0–3.0)
Eosinophils Relative: 1.8 % (ref 0.0–5.0)
Lymphocytes Relative: 22 % (ref 12.0–46.0)
Monocytes Relative: 8.9 % (ref 3.0–12.0)
Neutrophils Relative %: 67 % (ref 43.0–77.0)
Platelets: 309 10*3/uL (ref 150.0–400.0)
RBC: 4.02 Mil/uL (ref 3.87–5.11)
WBC: 4.6 10*3/uL (ref 4.5–10.5)

## 2011-11-27 LAB — LIPID PANEL
LDL Cholesterol: 97 mg/dL (ref 0–99)
Total CHOL/HDL Ratio: 5
Triglycerides: 180 mg/dL — ABNORMAL HIGH (ref 0.0–149.0)

## 2011-11-27 LAB — BASIC METABOLIC PANEL
BUN: 16 mg/dL (ref 6–23)
Chloride: 105 mEq/L (ref 96–112)
Creatinine, Ser: 0.6 mg/dL (ref 0.4–1.2)
GFR: 123.08 mL/min (ref >60.00)

## 2011-11-27 LAB — POCT URINALYSIS DIPSTICK
Blood, UA: NEGATIVE
Glucose, UA: NEGATIVE
Leukocytes, UA: NEGATIVE
Nitrite, UA: NEGATIVE
Urobilinogen, UA: 0.2

## 2011-11-27 LAB — HEPATIC FUNCTION PANEL
ALT: 16 U/L (ref 0–35)
AST: 17 U/L (ref 0–37)
Alkaline Phosphatase: 85 U/L (ref 39–117)
Bilirubin, Direct: 0 mg/dL (ref 0.0–0.3)
Total Bilirubin: 0.3 mg/dL (ref 0.3–1.2)
Total Protein: 6.8 g/dL (ref 6.0–8.3)

## 2011-11-27 LAB — HEMOGLOBIN A1C: Hgb A1c MFr Bld: 5.6 % (ref 4.6–6.5)

## 2011-11-27 MED ORDER — CITALOPRAM HYDROBROMIDE 20 MG PO TABS: 20.0000 mg | ORAL_TABLET | Freq: Every day | ORAL | Status: DC

## 2011-11-27 NOTE — Patient Instructions (Signed)

## 2011-11-27 NOTE — Progress Notes (Signed)
  Subjective:    Patient ID: Alexandria [name], female    DOB: Mar 09, 1961, 51 y.o.   MRN: [id number]  HPI Pt is here to f/u anxiety.  Pt doing well with celexa.  No complaints.     Review of Systems    as above Objective:   Physical Exam  Constitutional: She appears well-developed and well-nourished.  Cardiovascular: Normal rate, regular rhythm and normal heart sounds.   Pulmonary/Chest: Effort normal and breath sounds normal.  Psychiatric: She has a normal mood and affect. Her behavior is normal.          Assessment & Plan:  Anxiety---  Refill meds                rto 6 months or sooner prn for cpe

## 2011-11-28 LAB — RPR

## 2011-11-28 LAB — HSV 2 ANTIBODY, IGG: HSV 2 Glycoprotein G Ab, IgG: 0.1 IV

## 2011-12-08 ENCOUNTER — Other Ambulatory Visit: Payer: Self-pay | Admitting: Family Medicine

## 2011-12-11 ENCOUNTER — Other Ambulatory Visit (INDEPENDENT_AMBULATORY_CARE_PROVIDER_SITE_OTHER): Payer: BC Managed Care – PPO

## 2011-12-11 DIAGNOSIS — E0590 Thyrotoxicosis, unspecified without thyrotoxic crisis or storm: Secondary | ICD-10-CM

## 2011-12-11 DIAGNOSIS — E0500 Thyrotoxicosis with diffuse goiter without thyrotoxic crisis or storm: Secondary | ICD-10-CM

## 2011-12-12 LAB — T4, FREE: Free T4: 2.28 ng/dL — ABNORMAL HIGH (ref 0.60–1.60)

## 2011-12-29 ENCOUNTER — Ambulatory Visit: Payer: BC Managed Care – PPO | Admitting: Endocrinology

## 2012-01-05 ENCOUNTER — Ambulatory Visit (INDEPENDENT_AMBULATORY_CARE_PROVIDER_SITE_OTHER): Payer: BC Managed Care – PPO | Admitting: Family Medicine

## 2012-01-05 ENCOUNTER — Encounter: Payer: Self-pay | Admitting: Family Medicine

## 2012-01-05 VITALS — BP 130/80 | HR 85 | Temp 98.2°F | Wt 171.8 lb

## 2012-01-05 DIAGNOSIS — H6590 Unspecified nonsuppurative otitis media, unspecified ear: Secondary | ICD-10-CM

## 2012-01-05 DIAGNOSIS — J029 Acute pharyngitis, unspecified: Secondary | ICD-10-CM

## 2012-01-05 DIAGNOSIS — E0590 Thyrotoxicosis, unspecified without thyrotoxic crisis or storm: Secondary | ICD-10-CM

## 2012-01-05 LAB — TSH: TSH: <0.008 u[IU]/mL — ABNORMAL LOW (ref 0.350–4.500)

## 2012-01-05 LAB — T4, FREE: Free T4: 2.25 ng/dL — ABNORMAL HIGH (ref 0.80–1.80)

## 2012-01-05 LAB — T3, FREE: T3, Free: 5.4 pg/mL — ABNORMAL HIGH (ref 2.3–4.2)

## 2012-01-05 MED ORDER — FLUTICASONE PROPIONATE 50 MCG/ACT NA SUSP: 2.0000 | Freq: Every day | NASAL | Status: DC

## 2012-01-05 MED ORDER — AMOXICILLIN 875 MG PO TABS: 875.0000 mg | ORAL_TABLET | Freq: Two times a day (BID) | ORAL | Status: AC

## 2012-01-05 NOTE — Progress Notes (Signed)
Addended by: Silvio Pate D on: 01/05/2012 04:10 PM   Modules accepted: Orders

## 2012-01-05 NOTE — Patient Instructions (Signed)

## 2012-01-05 NOTE — Progress Notes (Signed)
  Subjective:     Alexandria [name] is a 51 y.o. female who presents for evaluation of sore throat. Associated symptoms include fevers up to 102 degrees, bilateral ear fullness, post nasal drip, productive cough, sinus and nasal congestion, sneezing and sore throat. Onset of symptoms was 7 days ago, and have been gradually worsening since that time. She is drinking plenty of fluids. She has had a recent close exposure to someone with proven streptococcal pharyngitis.  The following portions of the patient's history were reviewed and updated as appropriate: allergies, current medications, past family history, past medical history, past social history, past surgical history and problem list.  Review of Systems Pertinent items are noted in HPI.    Objective:    BP 130/80  Pulse 85  Temp(Src) 98.2 F (36.8 C) (Oral)  Wt 171 lb 12.8 oz (77.928 kg)  SpO2 97% General appearance: alert, cooperative, appears stated age and mild distress Eyes: conjunctivae/corneas clear. PERRL, EOM's intact. Fundi benign. Ears: abnormal TM right ear - diminished mobility, dull, bulging and air-fluid level and abnormal TM left ear - diminished mobility, dull, bulging and air-fluid level Nose: no discharge, turbinates red, swollen, no sinus tenderness Throat: abnormal findings: pnd Neck: mild anterior cervical adenopathy, supple, symmetrical, trachea midline and thyroid not enlarged, symmetric, no tenderness/mass/nodules Lungs: clear to auscultation bilaterally  Laboratory Strep test done. Results:negative.    Assessment:    Acute pharyngitis, likely  Bacterial tonsillitis R/O quick strep neg.  Serous otitis   Plan:    Patient placed on antibiotics. Use of OTC analgesics recommended as well as salt water gargles. Follow up as needed.

## 2012-01-07 ENCOUNTER — Encounter: Payer: Self-pay | Admitting: Family Medicine

## 2012-01-16 ENCOUNTER — Telehealth: Payer: Self-pay | Admitting: Family Medicine

## 2012-01-16 MED ORDER — FLUCONAZOLE 150 MG PO TABS: ORAL_TABLET | ORAL | Status: DC

## 2012-01-16 NOTE — Telephone Encounter (Signed)
Diflucan 150 mg #2  1 po qd x 1, may repeat in 3 days prn  

## 2012-01-16 NOTE — Telephone Encounter (Signed)
Patient aware Rx being sent.     KP 

## 2012-01-16 NOTE — Telephone Encounter (Signed)
[facility] Triage Call Report Triage Record Num: [id number] Operator: Richardean Chimera Patient Name: Alexandria [name] Call Date & Time: 01/16/2012 1:32:43PM Patient Phone: [phone] PCP: Patient Gender: Female PCP Fax : Patient DOB: Aug 06, 1961 Practice Name: [facility] Reason for Call: Caller: [name]/Patient is calling requesting a Rx for Diflucan. Pt. completed Amoxicillin for URI 01/15/12 and has vaginal itching, white, cheesy discharge, and odor; Afebrile; denies other sxs.PLEASE CALL PT TO LET HER KNOW IF THIS CAN BE CALLED IN OR NOT. 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 IF ABLE TO CALL SOMETHING IN, [facility] [phone] Protocol(s) Used: Vaginal Discharge or Irritation Recommended Outcome per Protocol: See Provider within 72 Hours Reason for Outcome: Foul smelling or unusual vaginal discharge (such as change in color, odor, or amount of vaginal discharge) Care Advice:

## 2012-02-01 ENCOUNTER — Ambulatory Visit (INDEPENDENT_AMBULATORY_CARE_PROVIDER_SITE_OTHER): Payer: BC Managed Care – PPO | Admitting: Endocrinology

## 2012-02-01 ENCOUNTER — Encounter: Payer: Self-pay | Admitting: Endocrinology

## 2012-02-01 VITALS — BP 118/68 | HR 76 | Temp 97.7°F | Ht 67.0 in | Wt 167.2 lb

## 2012-02-01 DIAGNOSIS — E0590 Thyrotoxicosis, unspecified without thyrotoxic crisis or storm: Secondary | ICD-10-CM

## 2012-02-01 NOTE — Patient Instructions (Signed)
let's check a thyroid "scan" (a special, but easy and painless type of thyroid x ray).  It works like this: you go to the x-ray department of the hospital to swallow a pill, which contains a miniscule amount of radiation.  You will not notice any symptoms from this.  You will go back to the x-ray department the next day, to lie down in front of a camera.  The results of this will be sent to me.   Based on the results, i hope to order for you a treatment pill of radioactive iodine.  Although it is a larger amount of radiation, you will again notice no symptoms from this.  The pill is gone from your body in a few days (during which you should stay away from other people), but takes several months to work.  Therefore, please return here approximately 6-8 weeks after the treatment.  This treatment has been available for many years, and the only known side-effect is an underactive thyroid.  It is possible that i would eventually prescribe for you a thyroid hormone pill, which is very inexpensive.  You don't have to worry about side-effects of this thyroid hormone pill, because it is the same molecule your thyroid makes.   (you can also go to endo-society.org)   Hyperthyroidism The thyroid is a large gland located in the lower front part of your neck. The thyroid helps control metabolism. Metabolism is how your body uses food. It controls metabolism with the hormone thyroxine. When the thyroid is overactive, it produces too much hormone. When this happens, these following problems may occur:    Nervousness   Heat intolerance   Weight loss (in spite of increase food intake)   Diarrhea   Change in hair or skin texture   Palpitations (heart skipping or having extra beats)   Tachycardia (rapid heart rate)   Loss of menstruation (amenorrhea)   Shaking of the hands  CAUSES  Grave's Disease (the immune system attacks the thyroid gland). This is the most common cause.   Inflammation of the thyroid  gland.   Tumor (usually benign) in the thyroid gland or elsewhere.   Excessive use of thyroid medications (both prescription and 'natural').   Excessive ingestion of Iodine.  DIAGNOSIS   To prove hyperthyroidism, your caregiver may do blood tests and ultrasound tests. Sometimes the signs are hidden. It may be necessary for your caregiver to watch this illness with blood tests, either before or after diagnosis and treatment. TREATMENT Short-term treatment There are several treatments to control symptoms. Drugs called beta blockers may give some relief. Drugs that decrease hormone production will provide temporary relief in many people. These measures will usually not give permanent relief. Definitive therapy There are treatments available which can be discussed between you and your caregiver which will permanently treat the problem. These treatments range from surgery (removal of the thyroid), to the use of radioactive iodine (destroys the thyroid by radiation), to the use of antithyroid drugs (interfere with hormone synthesis). The first two treatments are permanent and usually successful. They most often require hormone replacement therapy for life. This is because it is impossible to remove or destroy the exact amount of thyroid required to make a person euthyroid (normal). HOME CARE INSTRUCTIONS   See your caregiver if the problems you are being treated for get worse. Examples of this would be the problems listed above. SEEK MEDICAL CARE IF: Your general condition worsens. MAKE SURE YOU:    Understand these instructions.  Will watch your condition.   Will get help right away if you are not doing well or get worse.  Document Released: 10/23/2005 Document Revised: 10/12/2011 Document Reviewed: 03/06/2007 [facility] Patient Information 2012 [place], [place].

## 2012-02-01 NOTE — Progress Notes (Signed)
Subjective:    Patient ID: Alexandria [name], female    DOB: 01/30/1961, 51 y.o.   MRN: [id number]  HPI Pt states few mos of moderate palpitations in the chest, and assoc "hot flashes." Past Medical History  Diagnosis Date  . Anxiety   . Hyperlipidemia     Past Surgical History  Procedure Date  . Carpal tunnel release     History   Social History  . Marital Status: Divorced    Spouse Name: N/A    Number of Children: N/A  . Years of Education: N/A   Occupational History  . Not on file.   Social History Main Topics  . Smoking status: Never Smoker   . Smokeless tobacco: Never Used  . Alcohol Use: Yes  . Drug Use: No  . Sexually Active: Yes   Other Topics Concern  . Not on file   Social History Narrative  . No narrative on file    Current Outpatient Prescriptions on File Prior to Visit  Medication Sig Dispense Refill  . ALPRAZolam (XANAX) 0.5 MG tablet 1/2 to 1 tablet twice a day as needed  30 tablet  0  . citalopram (CELEXA) 20 MG tablet Take 1 tablet (20 mg total) by mouth daily.  30 tablet  5  . diclofenac (VOLTAREN) 75 MG EC tablet Take 75 mg by mouth 2 (two) times daily as needed.       . fluconazole (DIFLUCAN) 150 MG tablet Take 1 pill today and repeat in 3 days if needed  2 tablet  0  . fluticasone (FLONASE) 50 MCG/ACT nasal spray Place 2 sprays into the nose daily.  16 g  6    Allergies  Allergen Reactions  . Peanut-Containing Drug Products Swelling    Family History  Problem Relation Age of Onset  . Diabetes Maternal Grandmother   . Coronary artery disease Father   . Hyperlipidemia Mother   . Atrial fibrillation Father   . Coronary artery disease Paternal Grandfather   . Coronary artery disease Paternal Uncle   no thyroid dz  BP 118/68  Pulse 76  Temp(Src) 97.7 F (36.5 C) (Oral)  Ht 5\' 7"  (1.702 m)  Wt 167 lb 3.2 oz (75.841 kg)  BMI 26.19 kg/m2  SpO2 98%  LMP 01/26/2012  Review of Systems denies weight loss, headache, hoarseness, double  vision, chest pain, sob, diarrhea, myalgias, excessive diaphoresis, numbness, anxiety, hypoglycemia, rhinorrhea.  She has tremor and easy bruising.  She has chronic urinary frequency.    Objective:   Physical Exam VS: see vs page GEN: no distress HEAD: head: no deformity eyes: no periorbital swelling, no proptosis external nose and ears are normal. mouth: no lesion seen.   NECK: left thyroid lobe is 3x normal size.  The right lobe is normal. CHEST WALL: no deformity. LUNGS:  Clear to auscultation. CV: reg rate and rhythm, no murmur. ABD: abdomen is soft, nontender.  no hepatosplenomegaly.  not distended.  no hernia. MUSCULOSKELETAL: muscle bulk and strength are grossly normal.  no obvious joint swelling.  gait is normal and steady EXTEMITIES: no deformity.  no ulcer on the feet.  feet are of normal color and temp.  no edema.   PULSES: dorsalis pedis intact bilat.  NEURO:  cn 2-12 grossly intact.   readily moves all 4's.  sensation is intact to touch on the feet.  No tremor.   SKIN:  Normal texture and temperature.  No rash or suspicious lesion is visible.   NODES:  None palpable at the neck.   PSYCH: alert, oriented x3.  Does not appear anxious nor depressed.    Lab Results  Component Value Date   TSH <0.008* 01/05/2012      Assessment & Plan:  Hyperthyroidism, prob due to grave's dz. Anxiety, caused or exac by the hyperthyroidism Tremor, due to hyperthyroidism

## 2012-02-07 DIAGNOSIS — E0590 Thyrotoxicosis, unspecified without thyrotoxic crisis or storm: Secondary | ICD-10-CM | POA: Insufficient documentation

## 2012-02-14 ENCOUNTER — Telehealth: Payer: Self-pay | Admitting: Family Medicine

## 2012-02-14 NOTE — Telephone Encounter (Signed)
Please check on pt tomorrow

## 2012-02-14 NOTE — Telephone Encounter (Signed)
Caller: Kimari/Patient; PCP: Lelon Perla.; CB#: [phone]; Call regarding Can't Stop Twitching Waist Up, Dx. Hyperthyroid A Few Weeks Ago;  Onset 02/14/12 at 0930. Feels quivering or shaking of chest, neck or neck.  Head turning against her will.  Saw enodcrinologist who suggested radioactive ablation; was unhappy with MD recommendation so has done nothing.  Denies tachycardia or arrythmia.  Weaned off Celexa 3 wks ago.  RN concerned about untreated Graves disease.  Advised to go to [facility] ED now per nursing judgement for new onset of tremors and recently stopped taking RX med per Tremor Guideline.

## 2012-02-15 ENCOUNTER — Ambulatory Visit (INDEPENDENT_AMBULATORY_CARE_PROVIDER_SITE_OTHER): Payer: BC Managed Care – PPO | Admitting: Family Medicine

## 2012-02-15 ENCOUNTER — Encounter: Payer: Self-pay | Admitting: Family Medicine

## 2012-02-15 VITALS — BP 118/74 | HR 58 | Temp 98.6°F | Wt 169.6 lb

## 2012-02-15 DIAGNOSIS — R002 Palpitations: Secondary | ICD-10-CM

## 2012-02-15 DIAGNOSIS — E0590 Thyrotoxicosis, unspecified without thyrotoxic crisis or storm: Secondary | ICD-10-CM

## 2012-02-15 MED ORDER — ALPRAZOLAM 0.5 MG PO TABS: ORAL_TABLET | ORAL | Status: DC

## 2012-02-15 NOTE — Telephone Encounter (Signed)
Discussed with patient and she is still having the twitching after being put on propanolol 40 mg. Apt scheudled for this afternoon.     KP

## 2012-02-15 NOTE — Patient Instructions (Signed)
Hyperthyroidism  The thyroid is a large gland located in the lower front part of your neck. The thyroid helps control metabolism. Metabolism is how your body uses food. It controls metabolism with the hormone thyroxine. When the thyroid is overactive, it produces too much hormone. When this happens, these following problems may occur:     Nervousness    Heat intolerance    Weight loss (in spite of increase food intake)    Diarrhea    Change in hair or skin texture    Palpitations (heart skipping or having extra beats)    Tachycardia (rapid heart rate)    Loss of menstruation (amenorrhea)    Shaking of the hands   CAUSES   Grave's Disease (the immune system attacks the thyroid gland). This is the most common cause.    Inflammation of the thyroid gland.    Tumor (usually benign) in the thyroid gland or elsewhere.    Excessive use of thyroid medications (both prescription and 'natural').    Excessive ingestion of Iodine.   DIAGNOSIS    To prove hyperthyroidism, your caregiver may do blood tests and ultrasound tests. Sometimes the signs are hidden. It may be necessary for your caregiver to watch this illness with blood tests, either before or after diagnosis and treatment.  TREATMENT  Short-term treatment  There are several treatments to control symptoms. Drugs called beta blockers may give some relief. Drugs that decrease hormone production will provide temporary relief in many people. These measures will usually not give permanent relief.  Definitive therapy   There are treatments available which can be discussed between you and your caregiver which will permanently treat the problem. These treatments range from surgery (removal of the thyroid), to the use of radioactive iodine (destroys the thyroid by radiation), to the use of antithyroid drugs (interfere with hormone synthesis). The first two treatments are permanent and usually successful. They most often require hormone replacement therapy for life. This is because it is impossible to remove or destroy the exact amount of thyroid required to make a person euthyroid (normal).  HOME CARE INSTRUCTIONS    See your caregiver if the problems you are being treated for get worse. Examples of this would be the problems listed above.  SEEK MEDICAL CARE IF:  Your general condition worsens.  MAKE SURE YOU:     Understand these instructions.    Will watch your condition.    Will get help right away if you are not doing well or get worse.   Document Released: 10/23/2005 Document Revised: 10/12/2011 Document Reviewed: 03/06/2007  ExitCare Patient Information 2012 [employer].

## 2012-02-16 ENCOUNTER — Encounter: Payer: Self-pay | Admitting: Family Medicine

## 2012-02-16 NOTE — Progress Notes (Signed)
  Subjective:    Patient ID: Alexandria [name], female    DOB: Aug 06, 1961, 51 y.o.   MRN: [id number]  HPI  Pt here c/o twitching and palpitations.  She saw Dr Everardo All but is not happy with decision to do ablation.  She is requesting a 2nd opinion.    Review of Systems As above    Objective:   Physical Exam  Constitutional: She is oriented to person, place, and time. She appears well-developed and well-nourished. No distress.  Cardiovascular: Normal rate and regular rhythm.   Pulmonary/Chest: Effort normal and breath sounds normal.  Neurological: She is alert and oriented to person, place, and time.  Psychiatric: She has a normal mood and affect. Her behavior is normal.          Assessment & Plan:  Graves dz---- d/w pt we can treat symptoms---propanolol given by ER---- may be making her too dizzy           Try xanax for twitching etc           Refer for 2nd opinion endo

## 2012-04-02 ENCOUNTER — Other Ambulatory Visit (HOSPITAL_COMMUNITY): Payer: Self-pay | Admitting: Endocrinology

## 2012-04-02 DIAGNOSIS — E0590 Thyrotoxicosis, unspecified without thyrotoxic crisis or storm: Secondary | ICD-10-CM

## 2012-04-09 ENCOUNTER — Ambulatory Visit (HOSPITAL_COMMUNITY): Payer: BC Managed Care – PPO

## 2012-04-10 ENCOUNTER — Other Ambulatory Visit (HOSPITAL_COMMUNITY): Payer: BC Managed Care – PPO

## 2012-04-22 ENCOUNTER — Encounter (HOSPITAL_COMMUNITY)
Admission: RE | Admit: 2012-04-22 | Discharge: 2012-04-22 | Disposition: A | Discharge status: Home / Self Care | Payer: BC Managed Care – PPO | Source: Ambulatory Visit | Attending: Endocrinology | Admitting: Endocrinology

## 2012-04-22 DIAGNOSIS — E0590 Thyrotoxicosis, unspecified without thyrotoxic crisis or storm: Secondary | ICD-10-CM | POA: Insufficient documentation

## 2012-04-23 ENCOUNTER — Encounter (HOSPITAL_COMMUNITY)
Admission: RE | Admit: 2012-04-23 | Discharge: 2012-04-23 | Disposition: A | Discharge status: Home / Self Care | Payer: BC Managed Care – PPO | Source: Ambulatory Visit | Attending: Endocrinology | Admitting: Endocrinology

## 2012-04-23 MED ORDER — SODIUM IODIDE I 131 CAPSULE
9.6000 | Freq: Once | INTRAVENOUS | Status: AC | PRN
  Administered 2012-04-22: 9.6 via ORAL

## 2012-04-23 MED ORDER — SODIUM PERTECHNETATE TC 99M INJECTION
10.7000 | Freq: Once | INTRAVENOUS | Status: AC | PRN
  Administered 2012-04-23: 11 via INTRAVENOUS

## 2012-05-08 ENCOUNTER — Other Ambulatory Visit: Payer: Self-pay | Admitting: Endocrinology

## 2012-05-08 DIAGNOSIS — E0500 Thyrotoxicosis with diffuse goiter without thyrotoxic crisis or storm: Secondary | ICD-10-CM

## 2012-05-20 ENCOUNTER — Inpatient Hospital Stay (HOSPITAL_COMMUNITY)
Admission: RE | Admit: 2012-05-20 | Discharge: 2012-05-20 | Payer: BC Managed Care – PPO | Source: Ambulatory Visit | Attending: Endocrinology | Admitting: Endocrinology

## 2012-05-23 ENCOUNTER — Encounter (HOSPITAL_COMMUNITY): Payer: Self-pay

## 2012-05-23 ENCOUNTER — Encounter (HOSPITAL_COMMUNITY)
Admission: RE | Admit: 2012-05-23 | Discharge: 2012-05-23 | Disposition: A | Discharge status: Home / Self Care | Payer: BC Managed Care – PPO | Source: Ambulatory Visit | Attending: Endocrinology | Admitting: Endocrinology

## 2012-05-23 DIAGNOSIS — E0500 Thyrotoxicosis with diffuse goiter without thyrotoxic crisis or storm: Secondary | ICD-10-CM | POA: Insufficient documentation

## 2012-05-23 HISTORY — DX: Thyrotoxicosis with diffuse goiter without thyrotoxic crisis or storm: E05.00

## 2012-05-23 LAB — HCG, SERUM, QUALITATIVE: Preg, Serum: NEGATIVE

## 2012-05-23 MED ORDER — SODIUM IODIDE I 131 CAPSULE
15.6000 | Freq: Once | INTRAVENOUS | Status: AC | PRN
  Administered 2012-05-23: 15.6 via ORAL

## 2013-01-06 ENCOUNTER — Telehealth: Payer: Self-pay | Admitting: Family Medicine

## 2013-01-06 DIAGNOSIS — F419 Anxiety disorder, unspecified: Secondary | ICD-10-CM

## 2013-01-06 MED ORDER — CITALOPRAM HYDROBROMIDE 20 MG PO TABS: 20.0000 mg | ORAL_TABLET | Freq: Every day | ORAL | Status: DC

## 2013-01-06 NOTE — Telephone Encounter (Signed)
refill Citalopram HBR (Tab) 20 MG Take 1 tablet (20 mg total) by mouth daily. #30 wt/5-refills last fill 12.7.13

## 2013-05-20 ENCOUNTER — Telehealth: Payer: Self-pay | Admitting: Family Medicine

## 2013-05-20 DIAGNOSIS — R002 Palpitations: Secondary | ICD-10-CM

## 2013-05-20 MED ORDER — ALPRAZOLAM 0.5 MG PO TABS: ORAL_TABLET | ORAL | Status: DC

## 2013-05-20 NOTE — Telephone Encounter (Signed)
Faxed to pharmacy on file.Marland Kitchen    KP

## 2013-05-20 NOTE — Telephone Encounter (Signed)
Patient says that her father passed away over the weekend and she is having a hard time and needing her Xanax medication refilled. She is unable to come in until after she gets paid at the end of the month. She would like to see if Dr. Laury Axon will refill her medication if she come in early August to follow-up. Patient was last seen 02/15/12.

## 2013-05-20 NOTE — Telephone Encounter (Signed)
Patient has a scheduled appt. For /05/2013. Last Rx 02/15/2012. Please advise. GF/RN

## 2013-05-20 NOTE — Telephone Encounter (Signed)
Ok to refill x 1  

## 2013-06-11 ENCOUNTER — Encounter: Payer: Self-pay | Admitting: Lab

## 2013-06-12 ENCOUNTER — Ambulatory Visit (INDEPENDENT_AMBULATORY_CARE_PROVIDER_SITE_OTHER): Payer: BC Managed Care – PPO | Admitting: Family Medicine

## 2013-06-12 ENCOUNTER — Encounter: Payer: Self-pay | Admitting: Family Medicine

## 2013-06-12 VITALS — BP 118/70 | HR 70 | Temp 99.0°F | Wt 176.2 lb

## 2013-06-12 DIAGNOSIS — Z23 Encounter for immunization: Secondary | ICD-10-CM

## 2013-06-12 DIAGNOSIS — F4321 Adjustment disorder with depressed mood: Secondary | ICD-10-CM

## 2013-06-12 DIAGNOSIS — Z7251 High risk heterosexual behavior: Secondary | ICD-10-CM | POA: Insufficient documentation

## 2013-06-12 DIAGNOSIS — F411 Generalized anxiety disorder: Secondary | ICD-10-CM

## 2013-06-12 DIAGNOSIS — Z202 Contact with and (suspected) exposure to infections with a predominantly sexual mode of transmission: Secondary | ICD-10-CM

## 2013-06-12 DIAGNOSIS — Z2089 Contact with and (suspected) exposure to other communicable diseases: Secondary | ICD-10-CM

## 2013-06-12 NOTE — Assessment & Plan Note (Signed)
Pt is requesting STD testing Previous partner with + HSV

## 2013-06-12 NOTE — Progress Notes (Signed)
  Subjective:    Patient ID: Alexandria [name], female    DOB: 04/16/1961, 52 y.o.   MRN: [id number]  HPI Pt here f/u anxiety.  He father died from ALS and she needed an early refill.  Pt doing much better but still thinks about her dad in the coffin.  She is considering going for counseling through hospice. Pt is also in a new relationship and is requesting STD testing.   Review of Systems As above    Objective:   Physical Exam BP 118/70  Pulse 70  Temp(Src) 99 F (37.2 C) (Oral)  Wt 176 lb 3.2 oz (79.924 kg)  BMI 27.59 kg/m2  SpO2 99%  LMP 01/26/2012 General appearance: alert, cooperative, appears stated age and no distress Lungs: clear to auscultation bilaterally Heart: S1, S2 normal       Assessment & Plan:

## 2013-06-12 NOTE — Patient Instructions (Addendum)

## 2013-06-12 NOTE — Assessment & Plan Note (Signed)
Refill xanax Encouraged pt to go for counseling

## 2013-06-13 LAB — HSV 2 ANTIBODY, IGG: HSV 2 Glycoprotein G Ab, IgG: 0.13 IV

## 2013-06-13 LAB — RPR

## 2013-07-24 ENCOUNTER — Encounter: Payer: Self-pay | Admitting: Family Medicine

## 2013-09-11 ENCOUNTER — Other Ambulatory Visit: Payer: Self-pay

## 2014-08-21 ENCOUNTER — Other Ambulatory Visit: Payer: Self-pay

## 2014-09-28 ENCOUNTER — Other Ambulatory Visit: Payer: Self-pay | Admitting: Obstetrics and Gynecology

## 2015-05-20 ENCOUNTER — Ambulatory Visit (INDEPENDENT_AMBULATORY_CARE_PROVIDER_SITE_OTHER): Payer: BC Managed Care – PPO | Admitting: Family Medicine

## 2015-05-20 ENCOUNTER — Encounter: Payer: Self-pay | Admitting: Family Medicine

## 2015-05-20 VITALS — BP 122/76 | HR 62 | Temp 98.2°F | Resp 16 | Ht 67.0 in | Wt 163.8 lb

## 2015-05-20 DIAGNOSIS — F418 Other specified anxiety disorders: Secondary | ICD-10-CM

## 2015-05-20 DIAGNOSIS — R002 Palpitations: Secondary | ICD-10-CM | POA: Diagnosis not present

## 2015-05-20 MED ORDER — ALPRAZOLAM 0.5 MG PO TABS: ORAL_TABLET | ORAL | Status: DC

## 2015-05-20 MED ORDER — CITALOPRAM HYDROBROMIDE 10 MG PO TABS: 10.0000 mg | ORAL_TABLET | Freq: Every day | ORAL | Status: DC

## 2015-05-20 NOTE — Progress Notes (Signed)
Pre visit review using our clinic review tool, if applicable. No additional management support is needed unless otherwise documented below in the visit note. 

## 2015-05-20 NOTE — Patient Instructions (Signed)
Adjustment Disorder °Most changes in life can cause stress. Getting used to changes may take a few months or longer. If feelings of stress, hopelessness, or worry continue, you may have an adjustment disorder. This stress-related mental health problem may affect your feelings, thinking and how you act. It occurs in both sexes and happens at any age. °SYMPTOMS  °Some of the following problems may be seen and vary from person to person: °· Sadness or depression. °· Loss of enjoyment. °· Thoughts of suicide. °· Fighting. °· Avoiding family and friends. °· Poor school performance. °· Hopelessness, sense of loss. °· Trouble sleeping. °· Vandalism. °· Worry, weight loss or gain. °· Crying spells. °· Anxiety °· Reckless driving. °· Skipping school. °· Poor work performance. °· Nervousness. °· Ignoring bills. °· Poor attitude. °DIAGNOSIS  °Your caregiver will ask what has happened in your life and do a physical exam. They will make a diagnosis of an adjustment disorder when they are sure another problem or medical illness causing your feelings does not exist. °TREATMENT  °When problems caused by stress interfere with you daily life or last longer than a few months, you may need counseling for an adjustment disorder. Early treatment may diminish problems and help you to better cope with the stressful events in your life. Sometimes medication is necessary. Individual counseling and or support groups can be very helpful. °PROGNOSIS  °Adjustment disorders usually last less than 3 to 6 months. The condition may persist if there is long lasting stress. This could include health problems, relationship problems, or job difficulties where you can not easily escape from what is causing the problem. °PREVENTION  °Even the most mentally healthy, highly functioning people can suffer from an adjustment disorder given a significant blow from a life-changing event. There is no way to prevent pain and loss. Most people need help from time  to time. You are not alone. °SEEK MEDICAL CARE IF:  °Your feelings or symptoms listed above do not improve or worsen. °Document Released: 06/27/2006 Document Revised: 01/15/2012 Document Reviewed: 09/18/2007 °ExitCare® Patient Information ©2015 [employer]. This information is not intended to replace advice given to you by your health care provider. Make sure you discuss any questions you have with your health care provider. ° °

## 2015-05-20 NOTE — Progress Notes (Signed)
Patient ID: Alexandria [name], female    DOB: 04-11-1961  Age: 54 y.o. MRN: [id number]    Subjective:  Subjective HPI Alexandria [name] presents for f/u increased stress.  Her mother is getting older and doesn't treat her well.  + palpitations no cp or sob.   Review of Systems  Constitutional: Negative for diaphoresis, appetite change, fatigue and unexpected weight change.  Eyes: Negative for pain, redness and visual disturbance.  Respiratory: Negative for cough, chest tightness, shortness of breath and wheezing.   Cardiovascular: Negative for chest pain, palpitations and leg swelling.  Endocrine: Negative for cold intolerance, heat intolerance, polydipsia, polyphagia and polyuria.  Genitourinary: Negative for dysuria, frequency and difficulty urinating.  Neurological: Negative for dizziness, light-headedness, numbness and headaches.  Psychiatric/Behavioral: Negative for suicidal ideas, behavioral problems, sleep disturbance, self-injury and agitation. The patient is nervous/anxious.     History Past Medical History  Diagnosis Date  . Anxiety   . Hyperlipidemia   . Graves disease     She has past surgical history that includes Carpal tunnel release.   Her family history includes Atrial fibrillation in her father; Coronary artery disease in her father, paternal grandfather, and paternal uncle; Dementia in her mother; Diabetes in her maternal grandmother; Hyperlipidemia in her mother.She reports that she has never smoked. She has never used smokeless tobacco. She reports that she drinks alcohol. She reports that she does not use illicit drugs.  No current outpatient prescriptions on file prior to visit.   No current facility-administered medications on file prior to visit.     Objective:  Objective Physical Exam  Constitutional: She is oriented to person, place, and time. She appears well-developed and well-nourished.  HENT:  Head: Normocephalic and atraumatic.  Eyes: Conjunctivae and EOM  are normal.  Neck: Normal range of motion. Neck supple. No JVD present. Carotid bruit is not present. No thyromegaly present.  Cardiovascular: Normal rate, regular rhythm and normal heart sounds.   No murmur heard. Pulmonary/Chest: Effort normal and breath sounds normal. No respiratory distress. She has no wheezes. She has no rales. She exhibits no tenderness.  Musculoskeletal: She exhibits no edema.  Neurological: She is alert and oriented to person, place, and time.  Psychiatric: She has a normal mood and affect. Her behavior is normal. Thought content normal.   BP 122/76 mmHg  Pulse 62  Temp(Src) 98.2 F (36.8 C) (Oral)  Resp 16  Ht 5\' 7"  (1.702 m)  Wt 163 lb 12.8 oz (74.299 kg)  BMI 25.65 kg/m2  SpO2 99%  LMP 01/26/2012 Wt Readings from Last 3 Encounters:  05/20/15 163 lb 12.8 oz (74.299 kg)  06/12/13 176 lb 3.2 oz (79.924 kg)  02/15/12 169 lb 9.6 oz (76.93 kg)     Lab Results  Component Value Date   WBC 4.6 11/27/2011   HGB 11.8* 11/27/2011   HCT 34.3* 11/27/2011   PLT 309.0 11/27/2011   GLUCOSE 93 11/27/2011   CHOL 169 11/27/2011   TRIG 180.0* 11/27/2011   HDL 36.20* 11/27/2011   LDLCALC 97 11/27/2011   ALT 16 11/27/2011   AST 17 11/27/2011   NA 140 11/27/2011   K 3.4* 11/27/2011   CL 105 11/27/2011   CREATININE 0.6 11/27/2011   BUN 16 11/27/2011   CO2 27 11/27/2011   TSH <0.008* 01/05/2012   HGBA1C 5.6 11/27/2011    Nm Rai Therapy For Hyperthyroidism  05/23/2012   *RADIOLOGY REPORT*  Clinical Data: Hyperthyroidism  RADIOACTIVE IODINE THERAPY FOR HYPERTHYROIDISM:  Technique:  The  risks and benefits of radioactive iodine therapy were discussed with the patient in detail.  Alternative therapies were also mentioned.  Radiation safety was discussed with the patient, including how to protect the general public from exposure. There were no barriers to communication.  Written consent was obtained.  The patient then received a capsule containing the  radiopharmaceutical.  The patient will follow-up with the referring physician.  Radiopharmaceutical:  15.6 mCi I-131 sodium iodide.  IMPRESSION: Per oral administration of I-131 sodium iodide for the treatment of hyperthyroidism.  Original Report Authenticated By: Raelyn Number, M.D.    Assessment & Plan:  Plan I am having Alexandria [name] start on citalopram. I am also having her maintain her levothyroxine and ALPRAZolam.  Meds ordered this encounter  Medications  . levothyroxine (SYNTHROID, LEVOTHROID) 112 MCG tablet    Sig:   . ALPRAZolam (XANAX) 0.5 MG tablet    Sig: 1/2 to 1 tablet twice a day as needed    Dispense:  30 tablet    Refill:  0  . citalopram (CELEXA) 10 MG tablet    Sig: Take 1 tablet (10 mg total) by mouth daily.    Dispense:  30 tablet    Refill:  2    Problem List Items Addressed This Visit    None    Visit Diagnoses    Palpitations    -  Primary    Relevant Medications    ALPRAZolam (XANAX) 0.5 MG tablet    Other specified anxiety disorders        Relevant Medications    citalopram (CELEXA) 10 MG tablet       Follow-up: Return in about 6 months (around 11/20/2015), or if symptoms worsen or fail to improve.  Alexandria Koyanagi, DO

## 2015-11-04 ENCOUNTER — Telehealth: Payer: Self-pay | Admitting: Behavioral Health

## 2015-11-04 NOTE — Telephone Encounter (Signed)
The following care gaps were assessed: Mammogram Pap Colonoscopy Flu  Attempted to reach patient to schedule annual physical with PCP. Left message for patient to return call when available.

## 2015-12-28 ENCOUNTER — Ambulatory Visit (INDEPENDENT_AMBULATORY_CARE_PROVIDER_SITE_OTHER): Payer: BC Managed Care – PPO | Admitting: Family Medicine

## 2015-12-28 ENCOUNTER — Encounter: Payer: Self-pay | Admitting: Family Medicine

## 2015-12-28 VITALS — BP 116/86 | HR 65 | Temp 99.3°F | Wt 172.0 lb

## 2015-12-28 DIAGNOSIS — S134XXA Sprain of ligaments of cervical spine, initial encounter: Secondary | ICD-10-CM

## 2015-12-28 DIAGNOSIS — M545 Low back pain, unspecified: Secondary | ICD-10-CM | POA: Insufficient documentation

## 2015-12-28 DIAGNOSIS — R002 Palpitations: Secondary | ICD-10-CM | POA: Diagnosis not present

## 2015-12-28 MED ORDER — ALPRAZOLAM 0.5 MG PO TABS: ORAL_TABLET | ORAL | Status: DC

## 2015-12-28 MED ORDER — TRAMADOL HCL 50 MG PO TABS: 50.0000 mg | ORAL_TABLET | Freq: Three times a day (TID) | ORAL | Status: DC | PRN

## 2015-12-28 MED ORDER — METHOCARBAMOL 500 MG PO TABS: 500.0000 mg | ORAL_TABLET | Freq: Four times a day (QID) | ORAL | Status: DC | PRN

## 2015-12-28 NOTE — Patient Instructions (Signed)
Cervical Sprain  A cervical sprain is an injury in the neck in which the strong, fibrous tissues (ligaments) that connect your neck bones stretch or tear. Cervical sprains can range from mild to severe. Severe cervical sprains can cause the neck vertebrae to be unstable. This can lead to damage of the spinal cord and can result in serious nervous system problems. The amount of time it takes for a cervical sprain to get better depends on the cause and extent of the injury. Most cervical sprains heal in 1 to 3 weeks.  CAUSES   Severe cervical sprains may be caused by:    Contact sport injuries (such as from football, rugby, wrestling, hockey, auto racing, gymnastics, diving, martial arts, or boxing).    Motor vehicle collisions.    Whiplash injuries. This is an injury from a sudden forward and backward whipping movement of the head and neck.   Falls.   Mild cervical sprains may be caused by:    Being in an awkward position, such as while cradling a telephone between your ear and shoulder.    Sitting in a chair that does not offer proper support.    Working at a poorly designed computer station.    Looking up or down for long periods of time.   SYMPTOMS    Pain, soreness, stiffness, or a burning sensation in the front, back, or sides of the neck. This discomfort may develop immediately after the injury or slowly, 24 hours or more after the injury.    Pain or tenderness directly in the middle of the back of the neck.    Shoulder or upper back pain.    Limited ability to move the neck.    Headache.    Dizziness.    Weakness, numbness, or tingling in the hands or arms.    Muscle spasms.    Difficulty swallowing or chewing.    Tenderness and swelling of the neck.   DIAGNOSIS   Most of the time your health care provider can diagnose a cervical sprain by taking your history and doing a physical exam. Your health care provider will ask about previous neck injuries and any known neck  problems, such as arthritis in the neck. X-rays may be taken to find out if there are any other problems, such as with the bones of the neck. Other tests, such as a CT scan or MRI, may also be needed.   TREATMENT   Treatment depends on the severity of the cervical sprain. Mild sprains can be treated with rest, keeping the neck in place (immobilization), and pain medicines. Severe cervical sprains are immediately immobilized. Further treatment is done to help with pain, muscle spasms, and other symptoms and may include:   Medicines, such as pain relievers, numbing medicines, or muscle relaxants.    Physical therapy. This may involve stretching exercises, strengthening exercises, and posture training. Exercises and improved posture can help stabilize the neck, strengthen muscles, and help stop symptoms from returning.   HOME CARE INSTRUCTIONS    Put ice on the injured area.     Put ice in a plastic bag.     Place a towel between your skin and the bag.     Leave the ice on for 15-20 minutes, 3-4 times a day.    If your injury was severe, you may have been given a cervical collar to wear. A cervical collar is a two-piece collar designed to keep your neck from moving while it heals.      Do not remove the collar unless instructed by your health care provider.    If you have long hair, keep it outside of the collar.    Ask your health care provider before making any adjustments to your collar. Minor adjustments may be required over time to improve comfort and reduce pressure on your chin or on the back of your head.    Ifyou are allowed to remove the collar for cleaning or bathing, follow your health care provider's instructions on how to do so safely.    Keep your collar clean by wiping it with mild soap and water and drying it completely. If the collar you have been given includes removable pads, remove them every 1-2 days and hand wash them with soap and water. Allow them to air dry. They should be completely  dry before you wear them in the collar.    If you are allowed to remove the collar for cleaning and bathing, wash and dry the skin of your neck. Check your skin for irritation or sores. If you see any, tell your health care provider.    Do not drive while wearing the collar.    Only take over-the-counter or prescription medicines for pain, discomfort, or fever as directed by your health care provider.    Keep all follow-up appointments as directed by your health care provider.    Keep all physical therapy appointments as directed by your health care provider.    Make any needed adjustments to your workstation to promote good posture.    Avoid positions and activities that make your symptoms worse.    Warm up and stretch before being active to help prevent problems.   SEEK MEDICAL CARE IF:    Your pain is not controlled with medicine.    You are unable to decrease your pain medicine over time as planned.    Your activity level is not improving as expected.   SEEK IMMEDIATE MEDICAL CARE IF:    You develop any bleeding.   You develop stomach upset.   You have signs of an allergic reaction to your medicine.    Your symptoms get worse.    You develop new, unexplained symptoms.    You have numbness, tingling, weakness, or paralysis in any part of your body.   MAKE SURE YOU:    Understand these instructions.   Will watch your condition.   Will get help right away if you are not doing well or get worse.     This information is not intended to replace advice given to you by your health care provider. Make sure you discuss any questions you have with your health care provider.     Document Released: 08/20/2007 Document Revised: 10/28/2013 Document Reviewed: 04/30/2013  [employer] Interactive Patient Education 2016 [employer]

## 2015-12-28 NOTE — Progress Notes (Signed)
Pre visit review using our clinic review tool, if applicable. No additional management support is needed unless otherwise documented below in the visit note. 
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
## 2016-04-28 ENCOUNTER — Encounter: Payer: BC Managed Care – PPO | Admitting: Family Medicine

## 2016-05-19 ENCOUNTER — Ambulatory Visit (INDEPENDENT_AMBULATORY_CARE_PROVIDER_SITE_OTHER): Payer: BC Managed Care – PPO | Admitting: Medical

## 2016-05-19 ENCOUNTER — Encounter: Payer: Self-pay | Admitting: Medical

## 2016-05-19 VITALS — BP 118/78 | HR 94 | Temp 98.6°F | Ht 67.0 in | Wt 173.8 lb

## 2016-05-19 DIAGNOSIS — R002 Palpitations: Secondary | ICD-10-CM

## 2016-05-19 DIAGNOSIS — E039 Hypothyroidism, unspecified: Secondary | ICD-10-CM | POA: Diagnosis not present

## 2016-05-19 LAB — T4: T4 TOTAL: 14 ug/dL — AB (ref 4.5–12.0)

## 2016-05-19 LAB — TSH: TSH: 0.33 mIU/L — ABNORMAL LOW

## 2016-05-19 NOTE — Patient Instructions (Addendum)
Your ekg appears normal. I would stop all caffeinated beverage and foods with caffein(chocalate) until we understand what is going on.  These palpitations you felt maybe anxiety. So I recommend you take your full tab xanax if this occurs again. See if this stops palpitation. If palpitation not stopping  then be seen since it may be possible ekg could capture palpitation when you are symptomatic.  I think good idea to get tsh and t4 today to assess thyroid level. Go ahead and make appointment with your endocrinologist.  Follow up in 7-10 days or as needed. Also we may need to refer you to cardiologist again for futher/repeat testing like you had before.
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
## 2016-05-20 ENCOUNTER — Telehealth: Payer: Self-pay | Admitting: Medical

## 2016-05-20 DIAGNOSIS — Z8639 Personal history of other endocrine, nutritional and metabolic disease: Secondary | ICD-10-CM

## 2016-05-20 DIAGNOSIS — R7989 Other specified abnormal findings of blood chemistry: Secondary | ICD-10-CM

## 2016-05-20 MED ORDER — LEVOTHYROXINE SODIUM 100 MCG PO TABS: 100.0000 ug | ORAL_TABLET | Freq: Every day | ORAL | Status: DC

## 2016-05-20 NOTE — Telephone Encounter (Signed)
Referral to Dr. Chalmers Cater placed.

## 2016-05-22 NOTE — Progress Notes (Signed)
Quick Note:  Pt has seen results on MyChart and message also sent for patient to call back if any questions. ______ 

## 2016-07-04 ENCOUNTER — Other Ambulatory Visit: Payer: Self-pay | Admitting: Family Medicine

## 2016-07-04 DIAGNOSIS — R002 Palpitations: Secondary | ICD-10-CM

## 2016-07-05 NOTE — Telephone Encounter (Signed)
Last seen 05/19/16 Last filled #30 Please Advise----PC

## 2016-07-05 NOTE — Telephone Encounter (Signed)
Rx faxed to pharmacy  

## 2016-07-05 NOTE — Telephone Encounter (Signed)
Ok to send 30 tabs zero refill. See rx.

## 2016-11-06 HISTORY — PX: DILATION AND CURETTAGE OF UTERUS: SHX78

## 2016-12-11 ENCOUNTER — Encounter: Payer: Self-pay | Admitting: Family Medicine

## 2016-12-11 ENCOUNTER — Ambulatory Visit (INDEPENDENT_AMBULATORY_CARE_PROVIDER_SITE_OTHER): Payer: BC Managed Care – PPO | Admitting: Family Medicine

## 2016-12-11 ENCOUNTER — Ambulatory Visit (INDEPENDENT_AMBULATORY_CARE_PROVIDER_SITE_OTHER): Payer: BC Managed Care – PPO

## 2016-12-11 VITALS — BP 130/78 | HR 72 | Temp 98.4°F | Resp 16 | Ht 67.0 in | Wt 181.0 lb

## 2016-12-11 DIAGNOSIS — R002 Palpitations: Secondary | ICD-10-CM | POA: Diagnosis not present

## 2016-12-11 DIAGNOSIS — F419 Anxiety disorder, unspecified: Secondary | ICD-10-CM

## 2016-12-11 LAB — CBC WITH DIFFERENTIAL/PLATELET
BASOS PCT: 0.6 % (ref 0.0–3.0)
Basophils Absolute: 0 10*3/uL (ref 0.0–0.1)
EOS PCT: 2.1 % (ref 0.0–5.0)
Eosinophils Absolute: 0.1 10*3/uL (ref 0.0–0.7)
HCT: 36.7 % (ref 36.0–46.0)
Hemoglobin: 12.4 g/dL (ref 12.0–15.0)
LYMPHS ABS: 1.4 10*3/uL (ref 0.7–4.0)
Lymphocytes Relative: 24.9 % (ref 12.0–46.0)
MCHC: 33.9 g/dL (ref 30.0–36.0)
MCV: 87.7 fl (ref 78.0–100.0)
MONOS PCT: 8 % (ref 3.0–12.0)
Monocytes Absolute: 0.5 10*3/uL (ref 0.1–1.0)
NEUTROS ABS: 3.7 10*3/uL (ref 1.4–7.7)
NEUTROS PCT: 64.4 % (ref 43.0–77.0)
PLATELETS: 294 10*3/uL (ref 150.0–400.0)
RBC: 4.18 Mil/uL (ref 3.87–5.11)
RDW: 14 % (ref 11.5–15.5)
WBC: 5.7 10*3/uL (ref 4.0–10.5)

## 2016-12-11 LAB — COMPREHENSIVE METABOLIC PANEL
ALT: 12 U/L (ref 0–35)
AST: 12 U/L (ref 0–37)
Albumin: 4.1 g/dL (ref 3.5–5.2)
Alkaline Phosphatase: 83 U/L (ref 39–117)
BUN: 15 mg/dL (ref 6–23)
CO2: 32 meq/L (ref 19–32)
Calcium: 9.2 mg/dL (ref 8.4–10.5)
Chloride: 104 mEq/L (ref 96–112)
Creatinine, Ser: 0.76 mg/dL (ref 0.40–1.20)
GFR: 83.18 mL/min (ref >60.00)
GLUCOSE: 96 mg/dL (ref 70–99)
POTASSIUM: 3.5 meq/L (ref 3.5–5.1)
Sodium: 141 mEq/L (ref 135–145)
Total Bilirubin: 0.3 mg/dL (ref 0.2–1.2)
Total Protein: 7 g/dL (ref 6.0–8.3)

## 2016-12-11 LAB — TSH: TSH: 3.72 u[IU]/mL (ref 0.35–4.50)

## 2016-12-11 LAB — T3, FREE: T3 FREE: 3.1 pg/mL (ref 2.3–4.2)

## 2016-12-11 LAB — T4, FREE: Free T4: 1.01 ng/dL (ref 0.60–1.60)

## 2016-12-11 NOTE — Patient Instructions (Signed)
Palpitations A palpitation is the feeling that your heartbeat is irregular or is faster than normal. It may feel like your heart is fluttering or skipping a beat. Palpitations are usually not a serious problem. They may be caused by many things, including smoking, caffeine, alcohol, stress, and certain medicines. Although most causes of palpitations are not serious, palpitations can be a sign of a serious medical problem. In some cases, you may need further medical evaluation. Follow these instructions at home: Pay attention to any changes in your symptoms. Take these actions to help with your condition:  Avoid the following: ? Caffeinated coffee, tea, soft drinks, diet pills, and energy drinks. ? Chocolate. ? Alcohol.  Do not use any tobacco products, such as cigarettes, chewing tobacco, and e-cigarettes. If you need help quitting, ask your health care provider.  Try to reduce your stress and anxiety. Things that can help you relax include: ? Yoga. ? Meditation. ? Physical activity, such as swimming, jogging, or walking. ? Biofeedback. This is a method that helps you learn to use your mind to control things in your body, such as your heartbeats.  Get plenty of rest and sleep.  Take over-the-counter and prescription medicines only as told by your health care provider.  Keep all follow-up visits as told by your health care provider. This is important.  Contact a health care provider if:  You continue to have a fast or irregular heartbeat after 24 hours.  Your palpitations occur more often. Get help right away if:  You have chest pain or shortness of breath.  You have a severe headache.  You feel dizzy or you faint. This information is not intended to replace advice given to you by your health care provider. Make sure you discuss any questions you have with your health care provider. Document Released: 10/20/2000 Document Revised: 03/27/2016 Document Reviewed: 07/08/2015 Elsevier  Interactive Patient Education  2017 [employer] Inc.  

## 2016-12-11 NOTE — Progress Notes (Signed)
Subjective:    Patient ID: Alexandria [name], female    DOB: [birth year], 56 y.o.   MRN: [id number]  Chief Complaint  Patient presents with  . Follow-up  . Hypothyroidism  . heart fluttering    HPI Patient is in today for follow up thyroid and heart fluttering.  This has been going on for years.  She had been hyerthyroid but has recently been hypothyroid-- after iodine tx per pt.  He endo fears hyperthyroid is returning.  Pt is concerned about afib because her dad has it.   Past Medical History:  Diagnosis Date  . Anxiety   . Graves disease   . Hyperlipidemia     Past Surgical History:  Procedure Laterality Date  . CARPAL TUNNEL RELEASE      Family History  Problem Relation Age of Onset  . Coronary artery disease Father   . Atrial fibrillation Father   . Diabetes Maternal Grandmother   . Hyperlipidemia Mother   . Dementia Mother   . Coronary artery disease Paternal Grandfather   . Coronary artery disease Paternal Uncle     Social History   Social History  . Marital status: Divorced    Spouse name: N/A  . Number of children: N/A  . Years of education: N/A   Occupational History  . Not on file.   Social History Main Topics  . Smoking status: Never Smoker  . Smokeless tobacco: Never Used  . Alcohol use Yes  . Drug use: No  . Sexual activity: Yes   Other Topics Concern  . Not on file   Social History Narrative  . No narrative on file    Outpatient Medications Prior to Visit  Medication Sig Dispense Refill  . ALPRAZolam (XANAX) 0.5 MG tablet TAKE 1/2 TO 1 TABLET BY MOUTH TWICE A DAY AS NEEDED 30 tablet o  . levothyroxine (SYNTHROID, LEVOTHROID) 100 MCG tablet Take 1 tablet (100 mcg total) by mouth daily. 30 tablet 1  . methocarbamol (ROBAXIN) 500 MG tablet Take 1 tablet (500 mg total) by mouth every 6 (six) hours as needed for muscle spasms. (Patient not taking: Reported on 05/19/2016) 30 tablet 0  . traMADol (ULTRAM) 50 MG tablet Take 1 tablet (50 mg total) by  mouth every 8 (eight) hours as needed. (Patient not taking: Reported on 05/19/2016) 30 tablet 0   No facility-administered medications prior to visit.     Allergies  Allergen Reactions  . Peanut-Containing Drug Products Swelling    Review of Systems  Constitutional: Negative for chills, fever and malaise/fatigue.  HENT: Negative for congestion and hearing loss.   Eyes: Negative for discharge.  Respiratory: Negative for cough, sputum production and shortness of breath.   Cardiovascular: Negative for chest pain, palpitations and leg swelling.  Gastrointestinal: Negative for abdominal pain, blood in stool, constipation, diarrhea, heartburn, nausea and vomiting.  Genitourinary: Negative for dysuria, frequency, hematuria and urgency.  Musculoskeletal: Negative for back pain, falls and myalgias.  Skin: Negative for rash.  Neurological: Negative for dizziness, sensory change, loss of consciousness, weakness and headaches.  Endo/Heme/Allergies: Negative for environmental allergies. Does not bruise/bleed easily.  Psychiatric/Behavioral: Negative for depression and suicidal ideas. The patient is not nervous/anxious and does not have insomnia.        Objective:    Physical Exam  Constitutional: She is oriented to person, place, and time. She appears well-developed and well-nourished.  HENT:  Head: Normocephalic and atraumatic.  Eyes: Conjunctivae and EOM are normal.  Neck: Normal range  of motion. Neck supple. No JVD present. Carotid bruit is not present. No thyromegaly present.  Cardiovascular: Normal rate, regular rhythm and normal heart sounds.   No murmur heard. Pulmonary/Chest: Effort normal and breath sounds normal. No respiratory distress. She has no wheezes. She has no rales. She exhibits no tenderness.  Musculoskeletal: She exhibits no edema.  Neurological: She is alert and oriented to person, place, and time.  Psychiatric: She has a normal mood and affect. Her behavior is normal.  Judgment and thought content normal.  Nursing note and vitals reviewed.   BP 130/78 (BP Location: Left Arm, Cuff Size: Normal)   Pulse 72   Temp 98.4 F (36.9 C) (Oral)   Resp 16   Ht 5\' 7"  (1.702 m)   Wt 181 lb (82.1 kg)   LMP 01/26/2012   SpO2 98%   BMI 28.35 kg/m  Wt Readings from Last 3 Encounters:  12/11/16 181 lb (82.1 kg)  05/19/16 173 lb 12.8 oz (78.8 kg)  12/28/15 172 lb (78 kg)     Lab Results  Component Value Date   WBC 5.7 12/11/2016   HGB 12.4 12/11/2016   HCT 36.7 12/11/2016   PLT 294.0 12/11/2016   GLUCOSE 96 12/11/2016   CHOL 169 11/27/2011   TRIG 180.0 (H) 11/27/2011   HDL 36.20 (L) 11/27/2011   LDLCALC 97 11/27/2011   ALT 12 12/11/2016   AST 12 12/11/2016   NA 141 12/11/2016   K 3.5 12/11/2016   CL 104 12/11/2016   CREATININE 0.76 12/11/2016   BUN 15 12/11/2016   CO2 32 12/11/2016   TSH 3.72 12/11/2016   HGBA1C 5.6 11/27/2011    Lab Results  Component Value Date   TSH 3.72 12/11/2016   Lab Results  Component Value Date   WBC 5.7 12/11/2016   HGB 12.4 12/11/2016   HCT 36.7 12/11/2016   MCV 87.7 12/11/2016   PLT 294.0 12/11/2016   Lab Results  Component Value Date   NA 141 12/11/2016   K 3.5 12/11/2016   CO2 32 12/11/2016   GLUCOSE 96 12/11/2016   BUN 15 12/11/2016   CREATININE 0.76 12/11/2016   BILITOT 0.3 12/11/2016   ALKPHOS 83 12/11/2016   AST 12 12/11/2016   ALT 12 12/11/2016   PROT 7.0 12/11/2016   ALBUMIN 4.1 12/11/2016   CALCIUM 9.2 12/11/2016   GFR 83.18 12/11/2016   Lab Results  Component Value Date   CHOL 169 11/27/2011   Lab Results  Component Value Date   HDL 36.20 (L) 11/27/2011   Lab Results  Component Value Date   LDLCALC 97 11/27/2011   Lab Results  Component Value Date   TRIG 180.0 (H) 11/27/2011   Lab Results  Component Value Date   CHOLHDL 5 11/27/2011   Lab Results  Component Value Date   HGBA1C 5.6 11/27/2011       Assessment & Plan:   Problem List Items Addressed This Visit     None    Visit Diagnoses    Palpitation    -  Primary   Relevant Orders   EKG 12-Lead   TSH (Completed)   T3, free (Completed)   T4, free (Completed)   CBC with Differential/Platelet (Completed)   Comprehensive metabolic panel (Completed)   Cardiac event monitor   Anxiety          I have discontinued Ms. Beck's methocarbamol and traMADol. I am also having her maintain her levothyroxine and ALPRAZolam.  No orders of the defined  types were placed in this encounter.   CMA served as Education administrator during this visit. History, Physical and Plan performed by medical provider. Documentation and orders reviewed and attested to.  [name], DO

## 2016-12-11 NOTE — Progress Notes (Signed)
Pre visit review using our clinic review tool, if applicable. No additional management support is needed unless otherwise documented below in the visit note. 

## 2016-12-15 ENCOUNTER — Encounter: Payer: Self-pay | Admitting: Internal Medicine

## 2016-12-15 ENCOUNTER — Telehealth: Payer: Self-pay | Admitting: Family Medicine

## 2016-12-15 ENCOUNTER — Ambulatory Visit (INDEPENDENT_AMBULATORY_CARE_PROVIDER_SITE_OTHER): Payer: BC Managed Care – PPO | Admitting: Internal Medicine

## 2016-12-15 ENCOUNTER — Telehealth: Payer: Self-pay | Admitting: *Deleted

## 2016-12-15 DIAGNOSIS — I472 Ventricular tachycardia, unspecified: Secondary | ICD-10-CM

## 2016-12-15 NOTE — Telephone Encounter (Signed)
Received call from [facility] at [employer].  Pt manually activated monitor and noted she was feeling skipped beats and flutter.  Monitor strips showed 9 beats VTach with rate of 160.  Strips to be faxed to our office.  [employer] is trying to reach pt.  I placed call to pt and left message to call our office.

## 2016-12-15 NOTE — Telephone Encounter (Signed)
I spoke with pt. 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 She activated monitor.  She is feeling fine now.  Reports she also had episode Monday night.  Had trouble with electrodes on Wednesday and Thursday so did not wear monitor those days.  She is not having problems with electrodes at this time.

## 2016-12-15 NOTE — Telephone Encounter (Signed)
Monitor strips reviewed with Dr Caryl Comes. He would like to see her this afternoon in office.  I spoke with pt and made appt for her to see Dr Caryl Comes today at 4:00

## 2016-12-15 NOTE — Progress Notes (Signed)
ELECTROPHYSIOLOGY CONSULT NOTE  Patient ID: NYOMI LAST, MRN: [id number], DOB/AGE: 1961/02/28 56 y.o. Admit date: (Not on file) Date of Consult: 12/15/2016  Primary Physician: [name], DO Primary Cardiologist: new  Consulting Physician none  Chief Complaint: Ventricular tachycardia nonsustained   HPI Alexandria [name] is a 56 y.o. female  Is seen as an add-on today because event recorder demonstrated nonsustained ventricular tachycardia.  She has a long history of palpitations. Because of this she underwent an evaluation 15 or 20 years ago which included a stress echo that was normal. The summer she had a prolonged episode that lasted 5 or 6 hours. She describes it as her heart flip flopping. There was some lightheadedness. There is no presyncope. There is mild shortness of breath but no chest pain. She stop using caffeine at that time. Her next significant symptoms were about 2 weeks ago which were quite similar but less prolonged.  She was given an event recorder. She's had 2 activated events. The first associated with sinus rhythm which she thinks was more like the summer and the second event, the nonsustained ventricular tachycardia she describes as a pounding/throat symptom unlikely that she had felt before.  She is not fit.  She does not use alcohol or recreational drugs.     Past Medical History:  Diagnosis Date  . Anxiety   . Graves disease   . Hyperlipidemia       Surgical History:  Past Surgical History:  Procedure Laterality Date  . CARPAL TUNNEL RELEASE       Home Meds: Prior to Admission medications   Medication Sig Start Date End Date Taking? Authorizing Provider  ALPRAZolam (XANAX) 0.5 MG tablet TAKE 1/2 TO 1 TABLET BY MOUTH TWICE A DAY AS NEEDED 07/05/16   Debbrah Alar, NP  levothyroxine (SYNTHROID, LEVOTHROID) 100 MCG tablet Take 1 tablet (100 mcg total) by mouth daily. 05/20/16   Mackie Pai, PA-C    Allergies:  Allergies    Allergen Reactions  . Peanut-Containing Drug Products Swelling    Social History   Social History  . Marital status: Divorced    Spouse name: N/A  . Number of children: N/A  . Years of education: N/A   Occupational History  . Not on file.   Social History Main Topics  . Smoking status: Never Smoker  . Smokeless tobacco: Never Used  . Alcohol use Yes  . Drug use: No  . Sexual activity: Yes   Other Topics Concern  . Not on file   Social History Narrative  . No narrative on file     Family History  Problem Relation Age of Onset  . Coronary artery disease Father   . Atrial fibrillation Father   . Diabetes Maternal Grandmother   . Hyperlipidemia Mother   . Dementia Mother   . Coronary artery disease Paternal Grandfather   . Coronary artery disease Paternal Uncle      ROS:  Please see the history of present illness.     All other systems reviewed and negative.    Physical Exam:  Last menstrual period 01/26/2012. General: Well developed, well nourished female in no acute distress. Head: Normocephalic, atraumatic, sclera non-icteric, no xanthomas, nares are without discharge. EENT: normal  Lymph Nodes:  none Neck: Negative for carotid bruits. JVD not elevated. Back:without scoliosis kyphosis  Lungs: Clear bilaterally to auscultation without wheezes, rales, or rhonchi. Breathing is unlabored. Heart: RRR with S1 S2. No  murmur .  No rubs, or gallops appreciated. Abdomen: Soft, non-tender, non-distended with normoactive bowel sounds. No hepatomegaly. No rebound/guarding. No obvious abdominal masses. Msk:  Strength and tone appear normal for age. Extremities: No clubbing or cyanosis. No edema.  Distal pedal pulses are 2+ and equal bilaterally. Skin: Warm and Dry Neuro: Alert and oriented X 3. CN III-XII intact Grossly normal sensory and motor function . Psych:  Responds to questions appropriately with a normal affect.      Labs: Cardiac Enzymes No results for  input(s): CKTOTAL, CKMB, TROPONINI in the last 72 hours. CBC Lab Results  Component Value Date   WBC 5.7 12/11/2016   HGB 12.4 12/11/2016   HCT 36.7 12/11/2016   MCV 87.7 12/11/2016   PLT 294.0 12/11/2016   PROTIME: No results for input(s): LABPROT, INR in the last 72 hours. Chemistry   Recent Labs Lab 12/11/16 1418  NA 141  K 3.5  CL 104  CO2 32  BUN 15  CREATININE 0.76  CALCIUM 9.2  PROT 7.0  BILITOT 0.3  ALKPHOS 83  ALT 12  AST 12  GLUCOSE 96   Lipids Lab Results  Component Value Date   CHOL 169 11/27/2011   HDL 36.20 (L) 11/27/2011   LDLCALC 97 11/27/2011   TRIG 180.0 (H) 11/27/2011   BNP No results found for: PROBNP Thyroid Function Tests: No results for input(s): TSH, T4TOTAL, T3FREE, THYROIDAB in the last 72 hours.  Invalid input(s): FREET3 Miscellaneous No results found for: DDIMER  Radiology/Studies:  No results found.  EKG: Sinus rhythm at 61 Intervals 18/08/42 Nonspecific ST flattening Otherwise normal  Event recorder personally reviewed nonsustained ventricular tachycardia    Assessment and Plan:  Palpitations  Ventricular tachycardia-nonsustained   The patient has palpitations which are 1 occasion were associated with sinus rhythm. The other were associated with nonsustained ventricular tachycardia. Neither of these was clearly similar to the prolonged events that prompted the use of her monitor. I suspect that they may have been frequent ventricular ectopy. There were no symptoms of impaired consciousness  The implications of nonsustained ventricular tachycardia related both the symptoms which are modest but not severe but more importantly to whether they are associated with underlying structural heart disease. The fact that her ECG is normal and that the symptoms have been of long-standing make significant underlying structural heart disease less likely. To that end, we will undertake an echocardiogram initially. Presuming that this  is normal we will undertake cardiac MRI with gadolinium enhancement and   treadmill testing. There may be a role for signal average ECG as a noninvasive parameter to follow      Virl Axe

## 2016-12-15 NOTE — Telephone Encounter (Signed)
Caller name: Ta  Relation to pt: Dr.Steven Marzetta Board  Call back number: phone # [phone] fax [phone]   Reason for call:  Requesting most recent EKG report, please advise

## 2016-12-15 NOTE — Patient Instructions (Addendum)
Medication Instructions: - Your physician recommends that you continue on your current medications as directed. Please refer to the Current Medication list given to you today.  Labwork: - none ordered   Procedures/Testing: - Your physician has requested that you have an echocardiogram. Echocardiography is a painless test that uses sound waves to create images of your heart. It provides your doctor with information about the size and shape of your heart and how well your heart's chambers and valves are working. This procedure takes approximately one hour. There are no restrictions for this procedure.  - Your physician has requested that you have an exercise tolerance test- with Dr. Caryl Comes. For further information please visit [email or web address]. Please also follow instruction sheet, as given.  - Your physician has requested that you have a cardiac MRI. Cardiac MRI uses a computer to create images of your heart as its beating, producing both still and moving pictures of your heart and major blood vessels. For further information please visit [email or web address] Please follow the instruction sheet given to you today for more information.  ** Please take xanax 0.5 mg- one tablet on arrival to your MRI- you will need someone to drive you home **  Follow-Up: - pending results of your test  Any Additional Special Instructions Will Be Listed Below (If Applicable).     If you need a refill on your cardiac medications before your next appointment, please call your pharmacy.

## 2016-12-18 ENCOUNTER — Telehealth: Payer: Self-pay | Admitting: Internal Medicine

## 2016-12-18 NOTE — Telephone Encounter (Signed)
Called the patient.  She was hoping to have an open MRI, but, I told her the hospital did not have this.  She has medication for her severe claustrophobia.  She would like her appointment for next week.

## 2016-12-19 NOTE — Telephone Encounter (Signed)
Faxed EKG to Dr.Kleins office. Patient informed.

## 2016-12-20 ENCOUNTER — Encounter: Payer: Self-pay | Admitting: Internal Medicine

## 2016-12-25 ENCOUNTER — Telehealth: Payer: Self-pay | Admitting: Internal Medicine

## 2016-12-25 NOTE — Telephone Encounter (Signed)
The patient called me stating she cannot take 2 days off of school for MRI and [facility] appointment.  I told the patient I will have the technician contact her as it is a longer appointment.  Message sent to [facility].

## 2016-12-25 NOTE — Telephone Encounter (Signed)
PT NEEDS TO CANCEL  CARDIAC  MRI   CANNOT MISS WORK  2 DAYS IN  ROW  .RADIOLOGY NUMBER  GIVEN TO  PT.PT TO    CANCEL AND RESCHEDULED .Marland Kitchen/CY

## 2016-12-25 NOTE — Telephone Encounter (Signed)
New Message    Cancel MRI 2/26 AND HAVE IT RESCHEDULED FOR [facility]  PLEASE CALL PT TO CONFIRM THIS

## 2017-01-01 ENCOUNTER — Other Ambulatory Visit (HOSPITAL_COMMUNITY): Payer: BC Managed Care – PPO

## 2017-01-02 ENCOUNTER — Ambulatory Visit (INDEPENDENT_AMBULATORY_CARE_PROVIDER_SITE_OTHER): Payer: BC Managed Care – PPO

## 2017-01-02 ENCOUNTER — Ambulatory Visit (INDEPENDENT_AMBULATORY_CARE_PROVIDER_SITE_OTHER)
Admission: RE | Admit: 2017-01-02 | Discharge: 2017-01-02 | Disposition: A | Discharge status: Home / Self Care | Payer: Self-pay | Source: Ambulatory Visit | Attending: Internal Medicine | Admitting: Internal Medicine

## 2017-01-02 ENCOUNTER — Ambulatory Visit (HOSPITAL_COMMUNITY): Payer: BC Managed Care – PPO | Attending: Internal Medicine

## 2017-01-02 ENCOUNTER — Encounter: Payer: BC Managed Care – PPO | Admitting: Internal Medicine

## 2017-01-02 ENCOUNTER — Other Ambulatory Visit: Payer: Self-pay

## 2017-01-02 DIAGNOSIS — R9439 Abnormal result of other cardiovascular function study: Secondary | ICD-10-CM

## 2017-01-02 DIAGNOSIS — I472 Ventricular tachycardia, unspecified: Secondary | ICD-10-CM

## 2017-01-02 DIAGNOSIS — I361 Nonrheumatic tricuspid (valve) insufficiency: Secondary | ICD-10-CM | POA: Diagnosis not present

## 2017-01-02 DIAGNOSIS — I340 Nonrheumatic mitral (valve) insufficiency: Secondary | ICD-10-CM | POA: Insufficient documentation

## 2017-01-02 LAB — ECHOCARDIOGRAM COMPLETE
E decel time: 306 msec
E/e' ratio: 5.14
FS: 43 % (ref 28–44)
IVS/LV PW RATIO, ED: 0.8
LA diam end sys: 36 mm
LA diam index: 1.81 cm/m2
LA vol A4C: 58.1 ml
LA vol index: 32.7 mL/m2
LASIZE: 36 mm
LAVOL: 65.1 mL
LV E/e' medial: 5.14
LV E/e'average: 5.14
LV TDI E'LATERAL: 15.1
LV e' LATERAL: 15.1 cm/s
LVOT VTI: 28.3 cm
LVOT area: 3.46 cm2
LVOT diameter: 21 mm
LVOT peak vel: 104 cm/s
LVOTSV: 98 mL
MV Dec: 306
MV pk A vel: 70.3 m/s
MVPG: 2 mmHg
MVPKEVEL: 77.6 m/s
PW: 11.9 mm — AB (ref 0.6–1.1)
RV LATERAL S' VELOCITY: 13.5 cm/s
Reg peak vel: 221 cm/s
TAPSE: 23.3 mm
TDI e' medial: 8.7
TR max vel: 221 cm/s

## 2017-01-02 MED ORDER — DILTIAZEM HCL ER COATED BEADS 120 MG PO CP24: 120.0000 mg | ORAL_CAPSULE | Freq: Every day | ORAL | 6 refills | Status: DC

## 2017-01-02 NOTE — Patient Instructions (Addendum)
Medication Instructions: - Your physician has recommended you make the following change in your medication:  1) Start Cardizem (diltiazem) 120 mg- take one tablet by mouth once daily  Labwork: - none ordered  Procedures/Testing: - Your physician has recommended that you have a CT scan for Calcium scoring  Follow-Up: - pending  Any Additional Special Instructions Will Be Listed Below (If Applicable).     If you need a refill on your cardiac medications before your next appointment, please call your pharmacy.

## 2017-01-02 NOTE — Progress Notes (Unsigned)
Sodium 100 under I will have that waveforms

## 2017-01-04 DIAGNOSIS — G459 Transient cerebral ischemic attack, unspecified: Secondary | ICD-10-CM

## 2017-01-04 HISTORY — DX: Transient cerebral ischemic attack, unspecified: G45.9

## 2017-01-05 LAB — EXERCISE TOLERANCE TEST
CHL CUP RESTING HR STRESS: 56 {beats}/min
CHL RATE OF PERCEIVED EXERTION: 17
CSEPED: 5 min
CSEPHR: 103 %
Estimated workload: 8.5 METS
Exercise duration (sec): 0 s
MPHR: 163 {beats}/min
Peak HR: 169 {beats}/min

## 2017-01-07 LAB — PROTIME-INR

## 2017-01-08 ENCOUNTER — Telehealth: Payer: Self-pay | Admitting: Family Medicine

## 2017-01-08 NOTE — Telephone Encounter (Signed)
error:[id number] ° °

## 2017-01-09 ENCOUNTER — Other Ambulatory Visit (HOSPITAL_COMMUNITY): Payer: BC Managed Care – PPO

## 2017-01-10 ENCOUNTER — Ambulatory Visit (INDEPENDENT_AMBULATORY_CARE_PROVIDER_SITE_OTHER): Payer: BC Managed Care – PPO | Admitting: Medical

## 2017-01-10 VITALS — BP 140/76 | HR 66 | Temp 98.0°F | Ht 67.0 in | Wt 184.0 lb

## 2017-01-10 DIAGNOSIS — Z87898 Personal history of other specified conditions: Secondary | ICD-10-CM

## 2017-01-10 DIAGNOSIS — E039 Hypothyroidism, unspecified: Secondary | ICD-10-CM | POA: Diagnosis not present

## 2017-01-10 DIAGNOSIS — G459 Transient cerebral ischemic attack, unspecified: Secondary | ICD-10-CM

## 2017-01-10 DIAGNOSIS — R002 Palpitations: Secondary | ICD-10-CM | POA: Diagnosis not present

## 2017-01-10 DIAGNOSIS — F418 Other specified anxiety disorders: Secondary | ICD-10-CM | POA: Diagnosis not present

## 2017-01-10 MED ORDER — ALPRAZOLAM 0.5 MG PO TABS: ORAL_TABLET | ORAL | Status: DC

## 2017-01-10 NOTE — Patient Instructions (Addendum)
For recent TIA and history of tachycardia.  Please sign release form so we can get ED records and imaging studies from recent hospitalization.  Refer you back to cardiologist.  Refer to neurologist as well.  Continue your aspirin , Lipitor and Cardizem.  Repeat tsh and t4 today.  For anxiety rx xanax.  If any TIA/neurologic symptoms then ED evaluation. If any tachycardia or palpitations then ED evaluation as well.  Follow up in 2-3 wks or as needed

## 2017-01-10 NOTE — Progress Notes (Signed)
Subjective:    Patient ID: Alexandria [name], female    DOB: 10-31-1961, 56 y.o.   MRN: [id number]  HPI   Pt in for follow up.   Pt states hx of palpitations. We have seen her in our office in past. Dr. Etter Sjogren and myself. Pt also has some anxiety in past in addition to her palpiation history. Eventually was seen by cardiologist.  Pt went to cardiologist office Dr. Caryl Comes office and pt state work up was negative. However pt was told to take cardizem  Apparently on holter monitor she had one event of ventricular tachycardia per pt.She did not take due to concern for weight gain.    Below is doctor klein summary.  Palpitations  Ventricular tachycardia-nonsustained   The patient has palpitations which are 1 occasion were associated with sinus rhythm. The other were associated with nonsustained ventricular tachycardia. Neither of these was clearly similar to the prolonged events that prompted the use of her monitor. I suspect that they may have been frequent ventricular ectopy. There were no symptoms of impaired consciousness  The implications of nonsustained ventricular tachycardia related both the symptoms which are modest but not severe but more importantly to whether they are associated with underlying structural heart disease. The fact that her ECG is normal and that the symptoms have been of long-standing make significant underlying structural heart disease less likely. To that end, we will undertake an echocardiogram initially. Presuming that this is normal we will undertake cardiac MRI with gadolinium enhancement and   treadmill testing. There may be a role for signal average ECG as a noninvasive parameter to follow  This marks end of Dr. Caryl Comes portion of above.   Pt gives me update that she had TIA symptoms on Sunday morning. She woke up and could not lift her rt arm or rt leg. Pt tried to stand up and get out of bed and realized her rt leg was weak. Then pt tried to talk to her friend  and realized her rt side of face felt numb. She had slurred speech. Pt was taken to ED then after about an hour and half function started to return. Pt was admitted for one day.   I have labs done but I don't have ct, mri or ED note. Pt states that ED MD speculated irregular rhythm cause/led to TIA. But no sinificant arrythmia described by time she went to ED. I don't have ekg from ED to compare to.       Review of Systems  Constitutional: Negative for chills, fatigue and fever.  Respiratory: Negative for cough, chest tightness, shortness of breath and wheezing.   Cardiovascular: Negative for chest pain and palpitations.  Gastrointestinal: Negative for abdominal pain, diarrhea, nausea and vomiting.  Musculoskeletal: Negative for back pain.  Skin: Negative for rash.  Neurological: Negative for dizziness, tremors, seizures, syncope, facial asymmetry, speech difficulty, weakness, light-headedness, numbness and headaches.       Stable now. No symptoms.  Hematological: Negative for adenopathy. Does not bruise/bleed easily.  Psychiatric/Behavioral: Negative for behavioral problems and confusion. The patient is nervous/anxious.     Past Medical History:  Diagnosis Date  . Anxiety   . Graves disease   . Hyperlipidemia      Social History   Social History  . Marital status: Divorced    Spouse name: N/A  . Number of children: N/A  . Years of education: N/A   Occupational History  . Not on file.   Social History  Main Topics  . Smoking status: Never Smoker  . Smokeless tobacco: Never Used  . Alcohol use Yes  . Drug use: No  . Sexual activity: Yes   Other Topics Concern  . Not on file   Social History Narrative  . No narrative on file    Past Surgical History:  Procedure Laterality Date  . CARPAL TUNNEL RELEASE      Family History  Problem Relation Age of Onset  . Coronary artery disease Father   . Atrial fibrillation Father   . Diabetes Maternal Grandmother   .  Hyperlipidemia Mother   . Dementia Mother   . Coronary artery disease Paternal Grandfather   . Coronary artery disease Paternal Uncle     Allergies  Allergen Reactions  . Peanut-Containing Drug Products Swelling    Current Outpatient Prescriptions on File Prior to Visit  Medication Sig Dispense Refill  . ALPRAZolam (XANAX) 0.5 MG tablet TAKE 1/2 TO 1 TABLET BY MOUTH TWICE A DAY AS NEEDED 30 tablet o  . diltiazem (CARDIZEM CD) 120 MG 24 hr capsule Take 1 capsule (120 mg total) by mouth daily. 30 capsule 6  . levothyroxine (SYNTHROID, LEVOTHROID) 100 MCG tablet Take 1 tablet (100 mcg total) by mouth daily. 30 tablet 1   No current facility-administered medications on file prior to visit.     BP 140/76 (BP Location: Right Arm, Cuff Size: Normal)   Pulse 66   Temp 98 F (36.7 C)   Ht 5\' 7"  (1.702 m)   Wt 184 lb (83.5 kg)   LMP 01/26/2012   SpO2 99%   BMI 28.82 kg/m       Objective:   Physical Exam  General Mental Status- Alert. General Appearance- Not in acute distress.   Skin General: Color- Normal Color. Moisture- Normal Moisture.  Neck Carotid Arteries- Normal color. Moisture- Normal Moisture. No carotid bruits. No JVD.  Chest and Lung Exam Auscultation: Breath Sounds:-Normal.  Cardiovascular Auscultation:Rythm- Regular, rate and rythm Murmurs & Other Heart Sounds:Auscultation of the heart reveals- No Murmurs.  Abdomen Inspection:-Inspeection Normal. Palpation/Percussion:Note:No mass. Palpation and Percussion of the abdomen reveal- Non Tender, Non Distended + BS, no rebound or guarding.    Neurologic Cranial Nerve exam:- CN III-XII intact(No nystagmus), symmetric smile. Drift Test:- No drift. Romberg Exam:- Negative.  Heal to Toe Gait exam:-Normal. Finger to Nose:- Normal/Intact Strength:- 5/5 equal and symmetric strength both upper and lower extremities.      Assessment & Plan:  For recent TIA and history of tachycardia.  Please sign release  form so we can get ED records and imaging studies from recent hospitalization.  Refer you back to cardiologist.  Refer to neurologist as well.  Continue your aspirin , Lipitor and Cardizem.  Repeat tsh and t4 today.  For anxiety rx xanax.  If any TIA/neurologic symptoms then ED evaluation. If any tachycardia or palpitations then ED evaluation as well.  Folow up in 2-3 wks or as needed  Total time with pt today exceeded 40 minutes. 50% of time spent with counseling pt on how will proceed. Needed to access old cardiology notes and review all ED records she brought to me while counseled her on what may have contributed to her tia as well as how will proceed forward. In addition referrals had to be placed but referals were placed after visit done.

## 2017-01-10 NOTE — Progress Notes (Signed)
Pre visit review using our clinic review tool, if applicable. No additional management support is needed unless otherwise documented below in the visit note. 

## 2017-01-11 LAB — TSH: TSH: 7.97 u[IU]/mL — ABNORMAL HIGH (ref 0.35–4.50)

## 2017-01-11 LAB — T4, FREE: FREE T4: 0.92 ng/dL (ref 0.60–1.60)

## 2017-01-15 ENCOUNTER — Telehealth: Payer: Self-pay | Admitting: Medical

## 2017-01-15 NOTE — Telephone Encounter (Signed)
On pt office visit. She left her discharge instruction from her hospitalization. If she wants I am putting up front in file so she can come by and pick up.

## 2017-01-16 ENCOUNTER — Telehealth: Payer: Self-pay | Admitting: Medical

## 2017-01-16 ENCOUNTER — Ambulatory Visit: Payer: Self-pay | Admitting: Neurology

## 2017-01-16 NOTE — Telephone Encounter (Signed)
We are waiting on records from [facility]. Pt had tia and I referred her to neurologist and back to her cardiologist. What is word on referrals and getting records. Pt is asking if we got records so I assume referrals are pending?

## 2017-01-16 NOTE — Telephone Encounter (Addendum)
Called patient. Pt states she does not need discharge instructions from hospital.  That she's okay if we shred them.   While on the phone, she also mentioned that she spoke to Dr. Almetta Lovely office.  However, no changes were made to her levothyroxine dose.  Pt wants to verify how soon she needs Endocrinologist to make changes to her levothyroxine.  She said she was under the impression from Percell Miller that the changes needed to made soon.    She also inquired about whether the office had received her hospital records from [facility].    Please advise.

## 2017-01-16 NOTE — Telephone Encounter (Signed)
I am not sure how she interpreted that she need adjustment. I was just deferring decision to Dr. Chalmers Cater. I reviewed the labs and considered that Dr. Chalmers Cater might not make any changes as her t4 was normal and tsh was only slight elevated. So if Dr. Chalmers Cater state she needed no change I would tend to agree since reasonable to not change in light of t4 normal. Can we get Dr. Porfirio Oar last office note to review. Pt recently signed consent form for other records so can we get her office to fax over note then I will update pt on my thoughts on current management.  Go ahead and shred her dc summary as she states does not need.  Thanks,

## 2017-01-17 NOTE — Telephone Encounter (Signed)
Records were received on 01/12/17, where sent back to provider and faxed to [facility]

## 2017-01-18 ENCOUNTER — Telehealth: Payer: Self-pay | Admitting: Neurology

## 2017-01-18 ENCOUNTER — Encounter: Payer: Self-pay | Admitting: Neurology

## 2017-01-18 ENCOUNTER — Ambulatory Visit (INDEPENDENT_AMBULATORY_CARE_PROVIDER_SITE_OTHER): Payer: BC Managed Care – PPO | Admitting: Neurology

## 2017-01-18 VITALS — BP 135/85 | HR 71

## 2017-01-18 DIAGNOSIS — G459 Transient cerebral ischemic attack, unspecified: Secondary | ICD-10-CM | POA: Diagnosis not present

## 2017-01-18 NOTE — Progress Notes (Signed)
[facility] [address] [place]. [place] [address] [phone]       OFFICE CONSULT NOTE  [name] Date of Birth:  [birth year] Medical Record Number:  [id number]   Referring MD:  Garnet Koyanagi Reason for Referral:  TIA  HPI: Ms Alexandria [name] is a pleasant 56 year caucasian lady who had a TIA while visiting [place] [place] for vacation history is obtained from the patient and review of available medical records sent from her hospitalization at [facility]. She was feeling fine the night of 01/07/17 but woke up at 1:30 with sudden onset of right hemiplegia and numbness. She had trouble getting out of the bed but eventually she managed to call for help and her friend who was in across the hall came by and called the ambulance. She was taken to stand [facility] and [facility]. CT scan of the head on admission unremarkable. MRI scan of the brain and obtain the next day did not reveal any acute infarct. CT angiogram of the brain and neck  TSH on 3/4/8 was slightly elevated at 11.80 and the dose of her current medication has been changed by her primary physician recently did not show large vessel stenosis or occlusion. Hemoglobin A1c was 5.6. LDL cholesterol was elevated at 143 and triglycerides at 168. Patient was started on aspirin for stroke prevention. LDL cholesterol was elevated and she was started on Lipitor 40 mg daily. Transthoracic echocardiogram was apparently done and was normal but I do not see the report. She's done well since discharge. She is tolerating aspirin well without bleeding or bruising and Lipitor without muscle aches and pains. She has not had any recurrent stroke or TIA symptoms. However she feels quite anxious. She gets palpitations particularly at night. She is scared to sleep. 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 She feels her memory is not the quite the same since this episode. She has no prior  history of strokes or TIAs. She has no history of strokes or TIAs and no family.  ROS:   14 system review of systems is positive for  weight gain, fatigue, palpitations, skin moles, snoring, feeling hot and cold, memory loss, sleepiness, anxiety, decreased energy and all other systems negative PMH:  Past Medical History:  Diagnosis Date  . Anxiety   . Graves disease   . Hyperlipidemia   . Hypertension     Social History:  Social History   Social History  . Marital status: Divorced    Spouse name: N/A  . Number of children: N/A  . Years of education: N/A   Occupational History  . Not on file.   Social History Main Topics  . Smoking status: Never Smoker  . Smokeless tobacco: Never Used  . Alcohol use 0.6 oz/week    1 Glasses of wine per week     Comment: occasionally  . Drug use: No  . Sexual activity: Yes   Other Topics Concern  . Not on file   Social History Narrative  . No narrative on file    Medications:   Current Outpatient Prescriptions on File Prior to Visit  Medication Sig Dispense Refill  . ALPRAZolam (XANAX) 0.5 MG tablet TAKE 1/2 TO 1 TABLET BY MOUTH TWICE A DAY AS NEEDED 30 tablet o  . aspirin EC 81 MG tablet Take 81 mg by mouth daily.    Marland Kitchen atorvastatin (LIPITOR) 40 MG tablet Take 40 mg  by mouth daily.    Marland Kitchen diltiazem (CARDIZEM CD) 120 MG 24 hr capsule Take 1 capsule (120 mg total) by mouth daily. 30 capsule 6  . levothyroxine (SYNTHROID, LEVOTHROID) 100 MCG tablet Take 1 tablet (100 mcg total) by mouth daily. (Patient taking differently: Take 112 mcg by mouth daily. ) 30 tablet 1   No current facility-administered medications on file prior to visit.     Allergies:   Allergies  Allergen Reactions  . Peanut-Containing Drug Products Swelling    Physical Exam General: well developed, well nourished Middle-age Caucasian lady, seated, in no evident distress.Appears anxious Head: head normocephalic and atraumatic.   Neck: supple with no carotid or  supraclavicular bruits Cardiovascular: regular rate and rhythm, no murmurs Musculoskeletal: no deformity Skin:  no rash/petichiae Vascular:  Normal pulses all extremities  Neurologic Exam Mental Status: Awake and fully alert. Oriented to place and time. Recent and remote memory intact. Attention span, concentration and fund of knowledge appropriate. Mood and affect appropriate.  Cranial Nerves: Fundoscopic exam reveals sharp disc margins. Pupils equal, briskly reactive to light. Extraocular movements full without nystagmus. Visual fields full to confrontation. Hearing intact. Facial sensation intact. Face, tongue, palate moves normally and symmetrically.  Motor: Normal bulk and tone. Normal strength in all tested extremity muscles. Sensory.: intact to touch , pinprick , position and vibratory sensation.  Coordination: Rapid alternating movements normal in all extremities. Finger-to-nose and heel-to-shin performed accurately bilaterally. Gait and Station: Arises from chair without difficulty. Stance is normal. Gait demonstrates normal stride length and balance . Able to heel, toe and tandem walk without difficulty.  Reflexes: 1+ and symmetric. Toes downgoing.   NIHSS  0 Modified Rankin 1   ASSESSMENT: 56 year old lady with left brain TIA in March 2018 likely due to small vessel disease. Significant underlying anxiety and stress. Vascular risk factors of hypertension and hyperlipidemia    PLAN:  I had a long d/w patient about her recent TIA, small vessel disease, risk for recurrent stroke/TIAs, personally independently reviewed imaging studies and stroke evaluation results and answered questions.Continue aspirin 325 mg daily  for secondary stroke prevention and maintain strict control of hypertension with blood pressure goal below 130/90, diabetes with hemoglobin A1c goal below 6.5% and lipids with LDL cholesterol goal below 70 mg/dL. I also advised the patient to eat a healthy diet with plenty  of whole grains, cereals, fruits and vegetables, exercise regularly and maintain ideal body weight. Check transcranial Doppler bubble study for PFO and emboli monitoring. I also advised her to participate in meditation, yoga or other stress relaxation activities to help with her underlying anxiety. Greater than 50% time during this 45 minute consultation visit was spent on counseling and coordination of care about her TIA, stroke risk stratification, management of underlying anxiety and answering questions Followup in the future with me in 2 months or call earlier if necessary Antony Contras, [place] Pager: [phone] 01/18/2017 5:41 PM Note: This document was prepared with digital dictation and possible smart phrase technology. Any transcriptional errors that result from this process are unintentional.

## 2017-01-18 NOTE — Patient Instructions (Signed)
I had a long d/w patient about her recent TIA, small vessel disease, risk for recurrent stroke/TIAs, personally independently reviewed imaging studies and stroke evaluation results and answered questions.Continue aspirin 325 mg daily  for secondary stroke prevention and maintain strict control of hypertension with blood pressure goal below 130/90, diabetes with hemoglobin A1c goal below 6.5% and lipids with LDL cholesterol goal below 70 mg/dL. I also advised the patient to eat a healthy diet with plenty of whole grains, cereals, fruits and vegetables, exercise regularly and maintain ideal body weight. Check transcranial Doppler bubble study for PFO and emboli monitoring. I also advised her to participate in meditation, yoga or other stress relaxation activities to help with her underlying anxiety. Followup in the future with me in 2 months or call earlier if necessary Stroke Prevention Some medical conditions and behaviors are associated with an increased chance of having a stroke. You may prevent a stroke by making healthy choices and managing medical conditions. How can I reduce my risk of having a stroke?  Stay physically active. Get at least 30 minutes of activity on most or all days.  Do not smoke. It may also be helpful to avoid exposure to secondhand smoke.  Limit alcohol use. Moderate alcohol use is considered to be:  No more than 2 drinks per day for men.  No more than 1 drink per day for nonpregnant women.  Eat healthy foods. This involves:  Eating 5 or more servings of fruits and vegetables a day.  Making dietary changes that address high blood pressure (hypertension), high cholesterol, diabetes, or obesity.  Manage your cholesterol levels.  Making food choices that are high in fiber and low in saturated fat, trans fat, and cholesterol may control cholesterol levels.  Take any prescribed medicines to control cholesterol as directed by your health care provider.  Manage your  diabetes.  Controlling your carbohydrate and sugar intake is recommended to manage diabetes.  Take any prescribed medicines to control diabetes as directed by your health care provider.  Control your hypertension.  Making food choices that are low in salt (sodium), saturated fat, trans fat, and cholesterol is recommended to manage hypertension.  Ask your health care provider if you need treatment to lower your blood pressure. Take any prescribed medicines to control hypertension as directed by your health care provider.  If you are 77-99 years of age, have your blood pressure checked every 3-5 years. If you are 98 years of age or older, have your blood pressure checked every year.  Maintain a healthy weight.  Reducing calorie intake and making food choices that are low in sodium, saturated fat, trans fat, and cholesterol are recommended to manage weight.  Stop drug abuse.  Avoid taking birth control pills.  Talk to your health care provider about the risks of taking birth control pills if you are over 44 years old, smoke, get migraines, or have ever had a blood clot.  Get evaluated for sleep disorders (sleep apnea).  Talk to your health care provider about getting a sleep evaluation if you snore a lot or have excessive sleepiness.  Take medicines only as directed by your health care provider.  For some people, aspirin or blood thinners (anticoagulants) are helpful in reducing the risk of forming abnormal blood clots that can lead to stroke. If you have the irregular heart rhythm of atrial fibrillation, you should be on a blood thinner unless there is a good reason you cannot take them.  Understand all your medicine  instructions.  Make sure that other conditions (such as anemia or atherosclerosis) are addressed. Get help right away if:  You have sudden weakness or numbness of the face, arm, or leg, especially on one side of the body.  Your face or eyelid droops to one  side.  You have sudden confusion.  You have trouble speaking (aphasia) or understanding.  You have sudden trouble seeing in one or both eyes.  You have sudden trouble walking.  You have dizziness.  You have a loss of balance or coordination.  You have a sudden, severe headache with no known cause.  You have new chest pain or an irregular heartbeat. Any of these symptoms may represent a serious problem that is an emergency. Do not wait to see if the symptoms will go away. Get medical help at once. Call your local emergency services (911 in [place] Do not drive yourself to the hospital. This information is not intended to replace advice given to you by your health care provider. Make sure you discuss any questions you have with your health care provider. Document Released: 11/30/2004 Document Revised: 03/30/2016 Document Reviewed: 04/25/2013 Elsevier Interactive Patient Education  2017 [employer].

## 2017-01-18 NOTE — Telephone Encounter (Signed)
Pt needs bubble study before 6/4 appt.

## 2017-01-30 ENCOUNTER — Telehealth: Payer: Self-pay | Admitting: Neurology

## 2017-01-30 NOTE — Telephone Encounter (Signed)
Patient is returning a call to schedule appointment. She can be reached at [phone].

## 2017-01-30 NOTE — Telephone Encounter (Signed)
Called and left patient to call me back for scheduling for Doppler.

## 2017-02-05 ENCOUNTER — Telehealth: Payer: Self-pay | Admitting: Family Medicine

## 2017-02-05 MED ORDER — ASPIRIN EC 81 MG PO TBEC: 81.0000 mg | DELAYED_RELEASE_TABLET | Freq: Every day | ORAL | 3 refills | Status: DC

## 2017-02-05 MED ORDER — ATORVASTATIN CALCIUM 40 MG PO TABS: 40.0000 mg | ORAL_TABLET | Freq: Every day | ORAL | 3 refills | Status: DC

## 2017-02-05 NOTE — Telephone Encounter (Signed)
Caller name:Megon Beck Relationship to patient:self Can be reached:[phone] Pharmacy:[facility] in [facility]  Reason for call:requesting refill on aspirin 81mg  and atorvstatin 40mg 

## 2017-02-05 NOTE — Telephone Encounter (Signed)
Refill sent in and patient notified 

## 2017-02-05 NOTE — Progress Notes (Signed)
Electrophysiology Office Note Date: 02/07/2017  ID:  Alexandria [name], DOB 1961-01-30, MRN [id number]  PCP: [name], DO Electrophysiologist: Caryl Comes  CC: follow up on NSVT  Alexandria [name] is a 56 y.o. female seen today for Dr Caryl Comes.  She was first seen in 12/2016 by EP at which time an event monitor that was placed for palpitations demonstrated NSVT.  Echocardiogram demonstrated EF 60-65%, mild LVH.  Cardiac MRI was ordered but not yet done as the patient is concerned about missing time from work.  In March of this year, she had symptoms consistent with a TIA. She was seen by neurology who recommended ASA 325mg  daily as well as lifestyle modifications. TCD bubble study is pending. She presents today for routine electrophysiology followup.  Since last being seen in our clinic, the patient reports doing very well. Her palpitations have improved with daily cardizem. She is compliant with ASA and statin. She has been exercising more and trying to cut salt out of her diet.   She denies chest pain, dyspnea, PND, orthopnea, nausea, vomiting, dizziness, syncope, edema, weight gain, or early satiety.  Past Medical History:  Diagnosis Date  . Anxiety   . Graves disease   . Hyperlipidemia   . Hypertension    Past Surgical History:  Procedure Laterality Date  . CARPAL TUNNEL RELEASE      Current Outpatient Prescriptions  Medication Sig Dispense Refill  . ALPRAZolam (XANAX) 0.5 MG tablet TAKE 1/2 TO 1 TABLET BY MOUTH TWICE A DAY AS NEEDED 30 tablet o  . aspirin EC 81 MG tablet Take 1 tablet (81 mg total) by mouth daily. 90 tablet 3  . atorvastatin (LIPITOR) 40 MG tablet Take 1 tablet (40 mg total) by mouth daily. 90 tablet 3  . diltiazem (CARDIZEM CD) 120 MG 24 hr capsule Take 1 capsule (120 mg total) by mouth daily. 30 capsule 6  . levothyroxine (SYNTHROID, LEVOTHROID) 100 MCG tablet Take 1 tablet (100 mcg total) by mouth daily. 30 tablet 1   No current facility-administered medications for  this visit.     Allergies:   Peanut-containing drug products   Social History: Social History   Social History  . Marital status: Divorced    Spouse name: N/A  . Number of children: N/A  . Years of education: N/A   Occupational History  . Not on file.   Social History Main Topics  . Smoking status: Never Smoker  . Smokeless tobacco: Never Used  . Alcohol use 0.6 oz/week    1 Glasses of wine per week     Comment: occasionally  . Drug use: No  . Sexual activity: Yes   Other Topics Concern  . Not on file   Social History Narrative  . No narrative on file    Family History: Family History  Problem Relation Age of Onset  . Coronary artery disease Father   . Atrial fibrillation Father   . Diabetes Maternal Grandmother   . Hyperlipidemia Mother   . Dementia Mother   . Coronary artery disease Paternal Grandfather   . Coronary artery disease Paternal Uncle     Review of Systems: All other systems reviewed and are otherwise negative except as noted above.   Physical Exam: VS:  BP 130/70   Pulse 68   Ht 5\' 7"  (1.702 m)   Wt 180 lb 2 oz (81.7 kg)   LMP 01/26/2012   SpO2 97%   BMI 28.21 kg/m  ,  BMI Body mass index is 28.21 kg/m. Wt Readings from Last 3 Encounters:  02/07/17 180 lb 2 oz (81.7 kg)  01/10/17 184 lb (83.5 kg)  12/11/16 181 lb (82.1 kg)    GEN- The patient is well appearing, alert and oriented x 3 today.   HEENT: normocephalic, atraumatic; sclera clear, conjunctiva pink; hearing intact; oropharynx clear; neck supple  Lungs- Clear to ausculation bilaterally, normal work of breathing.  No wheezes, rales, rhonchi Heart- Regular rate and rhythm  GI- soft, non-tender, non-distended, bowel sounds present  Extremities- no clubbing, cyanosis, or edema  MS- no significant deformity or atrophy Skin- warm and dry, no rash or lesion  Psych- euthymic mood, full affect Neuro- strength and sensation are intact   EKG:  EKG is ordered today. The ekg ordered  today shows sinus rhythm, rate 67  Recent Labs: 12/11/2016: ALT 12; BUN 15; Creatinine, Ser 0.76; Hemoglobin 12.4; Platelets 294.0; Potassium 3.5; Sodium 141 01/10/2017: TSH 7.97    Other studies Reviewed: Additional studies/ records that were reviewed today include: Dr Olin Pia office notes, echo   Assessment and Plan: 1.  NSVT Correlated with symptoms of palpitations on recent event monitor per Dr Olin Pia notes She also had SR during palpitation symptoms Echo normal Pt reassured today. She is improved on daily Cardizem   2.  TIA Undergoing work up with neurology Bubble study pending  Could also consider ILR to monitor for atrial fibrillation. Will defer to neurology   3.  Overweight  Body mass index is 28.21 kg/m. Weight loss encouraged    Current medicines are reviewed at length with the patient today.   The patient does not have concerns regarding her medicines.  The following changes were made today:  none  Labs/ tests ordered today include: none No orders of the defined types were placed in this encounter.    Disposition:   Follow up with EP prn    Signed, Chanetta Marshall, NP 02/07/2017 10:16 AM   [facility] [place] [address] [phone] (office) [phone] (fax)

## 2017-02-05 NOTE — Telephone Encounter (Signed)
Patient is scheduled for TCD/ Bubbles. Thanks Hinton Dyer.

## 2017-02-07 ENCOUNTER — Other Ambulatory Visit: Payer: Self-pay | Admitting: *Deleted

## 2017-02-07 ENCOUNTER — Ambulatory Visit (INDEPENDENT_AMBULATORY_CARE_PROVIDER_SITE_OTHER): Payer: BC Managed Care – PPO | Admitting: Nurse Practitioner

## 2017-02-07 VITALS — BP 130/70 | HR 68 | Ht 67.0 in | Wt 180.1 lb

## 2017-02-07 DIAGNOSIS — I4729 Other ventricular tachycardia: Secondary | ICD-10-CM

## 2017-02-07 DIAGNOSIS — G459 Transient cerebral ischemic attack, unspecified: Secondary | ICD-10-CM

## 2017-02-07 DIAGNOSIS — I472 Ventricular tachycardia: Secondary | ICD-10-CM

## 2017-02-07 DIAGNOSIS — E663 Overweight: Secondary | ICD-10-CM

## 2017-02-07 MED ORDER — DILTIAZEM HCL ER COATED BEADS 120 MG PO CP24: 120.0000 mg | ORAL_CAPSULE | Freq: Every day | ORAL | 11 refills | Status: DC

## 2017-02-08 ENCOUNTER — Other Ambulatory Visit: Payer: BC Managed Care – PPO

## 2017-02-16 ENCOUNTER — Encounter (HOSPITAL_COMMUNITY): Payer: Self-pay | Admitting: Neurology

## 2017-02-16 ENCOUNTER — Ambulatory Visit (HOSPITAL_COMMUNITY)
Admission: RE | Admit: 2017-02-16 | Discharge: 2017-02-16 | Disposition: A | Discharge status: Home / Self Care | Payer: BC Managed Care – PPO | Source: Ambulatory Visit | Attending: Neurology | Admitting: Neurology

## 2017-02-16 DIAGNOSIS — G459 Transient cerebral ischemic attack, unspecified: Secondary | ICD-10-CM | POA: Diagnosis not present

## 2017-02-16 NOTE — Progress Notes (Signed)
*  PRELIMINARY RESULTS* Vascular Ultrasound Transcranial Doppler with Bubbles has been completed.   No High Intensity Transient Signals (HITS) were heard at rest or with Valsalva. Therefore, there is no evidence of patent foramen ovale (PFO).  02/16/2017 1:53 PM Maudry Mayhew, BS, RVT, RDCS, RDMS

## 2017-02-21 ENCOUNTER — Telehealth: Payer: Self-pay

## 2017-02-21 NOTE — Telephone Encounter (Signed)
-----   Message from Garvin Fila, MD sent at 02/21/2017  3:42 PM EDT ----- Mitchell Heir inform the patient that transcranial Doppler bubble study was normal

## 2017-02-21 NOTE — Telephone Encounter (Signed)
Left vm for patient to call back about transcranial doppler bubble study

## 2017-02-22 NOTE — Telephone Encounter (Signed)
Rn call patient back that the transcranial doppler bubble study  was normal. PT verbalized understanding.

## 2017-04-09 ENCOUNTER — Ambulatory Visit (INDEPENDENT_AMBULATORY_CARE_PROVIDER_SITE_OTHER): Payer: BC Managed Care – PPO | Admitting: Neurology

## 2017-04-09 ENCOUNTER — Encounter: Payer: Self-pay | Admitting: Neurology

## 2017-04-09 VITALS — BP 127/73 | HR 53 | Wt 178.6 lb

## 2017-04-09 DIAGNOSIS — R202 Paresthesia of skin: Secondary | ICD-10-CM | POA: Diagnosis not present

## 2017-04-09 NOTE — Progress Notes (Signed)
[facility] [address] [place]. [place] [address] [phone]       OFFICE FOLLOW UP VISIT NOTE  [name] Date of Birth:  11-May-1961 Medical Record Number:  [id number]   Referring MD:  Garnet Koyanagi Reason for Referral:  TIA  HPI: Initial Consult 01/18/2017 :Ms Alexandria [name] is a pleasant 56 year caucasian lady who had a TIA while visiting [facility] for vacation history is obtained from the patient and review of available medical records sent from her hospitalization at [facility] at [facility]. She was feeling fine the night of 01/07/17 but woke up at 1:30 with sudden onset of right hemiplegia and numbness. She had trouble getting out of the bed but eventually she managed to call for help and her friend who was in across the hall came by and called the ambulance. She was taken to stand [facility] and [facility]. CT scan of the head on admission unremarkable. MRI scan of the brain and obtain the next day did not reveal any acute infarct. CT angiogram of the brain and neck  TSH on 3/4/8 was slightly elevated at 11.80 and the dose of her current medication has been changed by her primary physician recently did not show large vessel stenosis or occlusion. Hemoglobin A1c was 5.6. LDL cholesterol was elevated at 143 and triglycerides at 168. Patient was started on aspirin for stroke prevention. LDL cholesterol was elevated and she was started on Lipitor 40 mg daily. Transthoracic echocardiogram was apparently done and was normal but I do not see the report. She's done well since discharge. She is tolerating aspirin well without bleeding or bruising and Lipitor without muscle aches and pains. She has not had any recurrent stroke or TIA symptoms. However she feels quite anxious. She gets palpitations particularly at night. She is scared to sleep. 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 She feels her memory is not the quite the same  since this episode. She has no prior history of strokes or TIAs. She has no history of strokes or TIAs and no family. Update 04/09/2017 : She returns for follow-up after last visit 58 and half months ago. She continues to do well and has not had any recurrent TIA or stroke symptoms. He is tolerating aspirin well without bleeding or bruising with GI side effects. She remains in the normally and. She has not yet had follow-up with his physicians. Blood pressure remains well controlled.and today it is 127/73. She complains of intermittent unsure whether or TIA. She feels it is not bothersome she did have Transcranialr Doppler study done on 02/16/17 which was negative ROS:   14 system review of systems is positive for   blurred vision, palpitation, food allergies, numbness, tingling, snoring and all other systems negative  PMH:  Past Medical History:  Diagnosis Date  . Anxiety   . Graves disease   . Hyperlipidemia   . Hypertension   . Stroke [facility])     Social History:  Social History   Social History  . Marital status: Divorced    Spouse name: N/A  . Number of children: N/A  . Years of education: N/A   Occupational History  . Not on file.   Social History Main Topics  . Smoking status: Never Smoker  . Smokeless tobacco: Never Used  . Alcohol use 0.6 oz/week    1 Glasses of wine per week     Comment: occasionally  .  Drug use: No  . Sexual activity: Yes   Other Topics Concern  . Not on file   Social History Narrative  . No narrative on file    Medications:   Current Outpatient Prescriptions on File Prior to Visit  Medication Sig Dispense Refill  . ALPRAZolam (XANAX) 0.5 MG tablet TAKE 1/2 TO 1 TABLET BY MOUTH TWICE A DAY AS NEEDED 30 tablet o  . aspirin EC 81 MG tablet Take 1 tablet (81 mg total) by mouth daily. 90 tablet 3  . atorvastatin (LIPITOR) 40 MG tablet Take 1 tablet (40 mg total) by mouth daily. 90 tablet 3  . diltiazem (CARDIZEM CD) 120 MG 24 hr capsule Take 1 capsule  (120 mg total) by mouth daily. 30 capsule 11   No current facility-administered medications on file prior to visit.     Allergies:   Allergies  Allergen Reactions  . Peanut-Containing Drug Products Swelling    Physical Exam General: well developed, well nourished Middle-age Caucasian lady, seated, in no evident distress.Appears anxious Head: head normocephalic and atraumatic.   Neck: supple with no carotid or supraclavicular bruits Cardiovascular: regular rate and rhythm, no murmurs Musculoskeletal: no deformity Skin:  no rash/petichiae Vascular:  Normal pulses all extremities  Neurologic Exam Mental Status: Awake and fully alert. Oriented to place and time. Recent and remote memory intact. Attention span, concentration and fund of knowledge appropriate. Mood and affect appropriate.  Cranial Nerves: Fundoscopic exam reveals sharp disc margins. Pupils equal, briskly reactive to light. Extraocular movements full without nystagmus. Visual fields full to confrontation. Hearing intact. Facial sensation intact. Face, tongue, palate moves normally and symmetrically.  Motor: Normal bulk and tone. Normal strength in all tested extremity muscles. Sensory.: intact to touch , pinprick , position and vibratory sensation. No sensory loss in the right foot. Coordination: Rapid alternating movements normal in all extremities. Finger-to-nose and heel-to-shin performed accurately bilaterally. Gait and Station: Arises from chair without difficulty. Stance is normal. Gait demonstrates normal stride length and balance . Able to heel, toe and tandem walk without difficulty.  Reflexes: 1+ and symmetric. Toes downgoing.       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 Significant underlying anxiety and stress. Vascular risk factors of hypertension and hyperlipidemia. New complaints of right leg tingling of unclear etiology.    PLAN: I had a long  d/w patient about her recent TIA, new right foot tingling, risk for recurrent stroke/TIAs, personally independently reviewed imaging studies and stroke evaluation results and answered questions.Continue aspirin 81 mg daily  for secondary stroke prevention and maintain strict control of hypertension with blood pressure goal below 130/90, diabetes with hemoglobin A1c goal below 6.5% and lipids with LDL cholesterol goal below 70 mg/dL. I also advised the patient to eat a healthy diet with plenty of whole grains, cereals, fruits and vegetables, exercise regularly and maintain ideal body weight . We discussed her right foot tingling being of unclear etiology. It is unclear whether it is residual effect of TIA or related to old injury or entrapment or diabetic neuropathy. She feels symptoms are not disabling enough at the present time to justify medications. Hence we recommend conservative follow-up but she was instructed to call if her symptoms got worse for further evaluation. She was also advised to see her primary physician lab work for follow-up lipid profile. Greater than 50% time during this 25 minute visit was spent in counseling and coordination of care about her  right foot tingling, recent TIA and answering questions Followup in the future with my nurse practitioner in 6 months or call earlier if necessary Antony Contras, [place] Pager: [phone] 04/09/2017 10:10 AM Note: This document was prepared with digital dictation and possible smart phrase technology. Any transcriptional errors that result from this process are unintentional.

## 2017-04-09 NOTE — Patient Instructions (Signed)
I had a long d/w patient about her recent TIA, new right foot tingling, risk for recurrent stroke/TIAs, personally independently reviewed imaging studies and stroke evaluation results and answered questions.Continue aspirin 81 mg daily  for secondary stroke prevention and maintain strict control of hypertension with blood pressure goal below 130/90, diabetes with hemoglobin A1c goal below 6.5% and lipids with LDL cholesterol goal below 70 mg/dL. I also advised the patient to eat a healthy diet with plenty of whole grains, cereals, fruits and vegetables, exercise regularly and maintain ideal body weight . We discussed her right foot tingling being of unclear etiology. It is unclear whether it is residual effect of TIA or related to old injury or entrapment or diabetic neuropathy. She feels symptoms are not disabling enough at the present time to justify medications. Hence we recommend conservative follow-up but she was instructed to call if her symptoms got worse for further evaluation. She was also advised to see her primary physician lab work for follow-up lipid profile Followup in the future with my nurse practitioner in 6 months or call earlier if necessary

## 2017-10-09 ENCOUNTER — Ambulatory Visit: Payer: BC Managed Care – PPO | Admitting: Neurology

## 2017-10-09 ENCOUNTER — Encounter: Payer: Self-pay | Admitting: Neurology

## 2017-10-09 VITALS — BP 127/75 | HR 62 | Ht 67.0 in | Wt 179.6 lb

## 2017-10-09 DIAGNOSIS — R200 Anesthesia of skin: Secondary | ICD-10-CM | POA: Diagnosis not present

## 2017-10-09 DIAGNOSIS — R202 Paresthesia of skin: Secondary | ICD-10-CM | POA: Diagnosis not present

## 2017-10-09 NOTE — Patient Instructions (Signed)
I had a long d/w patient about her remote TIA, risk for recurrent stroke/TIAs, personally independently reviewed imaging studies and stroke evaluation results and answered questions.Continue aspirin 81 mg daily  for secondary stroke prevention and maintain strict control of hypertension with blood pressure goal below 130/90, diabetes with hemoglobin A1c goal below 6.5% and lipids with LDL cholesterol goal below 70 mg/dL. I also advised the patient to eat a healthy diet with plenty of whole grains, cereals, fruits and vegetables, exercise regularly and maintain ideal body weight .I advised the patient to follow-up with her primary care physician for her left foot numbness if it does not improve in a week or so. I also advised her to do regular activities for stress relaxation like walking, swimming, medication and yoga. No need for routine scheduled follow-up appointment but she may be referred back by her primary physician in the future as needed.

## 2017-10-09 NOTE — Progress Notes (Signed)
[facility] [address] [place]. [place] [address] [phone]       OFFICE FOLLOW UP VISIT NOTE  [name] Date of Birth:  [birth year] Medical Record Number:  [id number]   Referring MD:  Garnet Koyanagi Reason for Referral:  TIA  HPI: Initial Consult 01/18/2017 :Ms Alexandria [name] is a pleasant 8 year caucasian lady who had a TIA while visiting [facility] for vacation history is obtained from the patient and review of available medical records sent from her hospitalization at [facility] at [facility]. She was feeling fine the night of 01/07/17 but woke up at 1:30 with sudden onset of right hemiplegia and numbness. She had trouble getting out of the bed but eventually she managed to call for help and her friend who was in across the hall came by and called the ambulance. She was taken to stand [facility] and [facility]. CT scan of the head on admission unremarkable. MRI scan of the brain and obtain the next day did not reveal any acute infarct. CT angiogram of the brain and neck  TSH on 3/4/8 was slightly elevated at 11.80 and the dose of her current medication has been changed by her primary physician recently did not show large vessel stenosis or occlusion. Hemoglobin A1c was 5.6. LDL cholesterol was elevated at 143 and triglycerides at 168. Patient was started on aspirin for stroke prevention. LDL cholesterol was elevated and she was started on Lipitor 40 mg daily. Transthoracic echocardiogram was apparently done and was normal but I do not see the report. She's done well since discharge. She is tolerating aspirin well without bleeding or bruising and Lipitor without muscle aches and pains. She has not had any recurrent stroke or TIA symptoms. However she feels quite anxious. She gets palpitations particularly at night. She is scared to sleep. 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 She feels her memory is not the quite the same  since this episode. She has no prior history of strokes or TIAs. She has no history of strokes or TIAs and no family. Update 04/09/2017 : She returns for follow-up after last visit 2 and half months ago. She continues to do well and has not had any recurrent TIA or stroke symptoms. He is tolerating aspirin well without bleeding or bruising with GI side effects. She remains in the normally and. She has not yet had follow-up with his physicians. Blood pressure remains well controlled.and today it is 127/73. She complains of intermittent unsure whether or TIA. She feels it is not bothersome she did have Transcranialr Doppler study done on 02/16/17 which was negative Update 10/09/2017 : She returns for follow-up after last visit 6 months ago. She states she'll doing well and has not had any stroke or TIA symptoms. She is starting aspirin well without bruising or bleeding. Her blood pressure is well controlled. She is tolerating Lipitor without muscle aches and pains. She cannot tell me last time lipid profile was checked. She complains of some intermittent tingling in the left foot and ankle but she blames this on lifting a heavy carpet a few weeks ago. This is partially improving but not gone yet. She has seen a chiropractor but not found much relief. She plans on discussing this with primary care physician soon and upcoming visit. She has otherwise no other new neurological symptoms ROS:   14 system review of systems is positive for   snoring, palpitations, numbness, tingling,  and all other systems negative  PMH:  Past Medical History:  Diagnosis Date  . Anxiety   . Graves disease   . Hyperlipidemia   . Hypertension   . Stroke [facility])     Social History:  Social History   Socioeconomic History  . Marital status: Divorced    Spouse name: Not on file  . Number of children: Not on file  . Years of education: Not on file  . Highest education level: Not on file  Social Needs  . Financial resource  strain: Not on file  . Food insecurity - worry: Not on file  . Food insecurity - inability: Not on file  . Transportation needs - medical: Not on file  . Transportation needs - non-medical: Not on file  Occupational History  . Not on file  Tobacco Use  . Smoking status: Never Smoker  . Smokeless tobacco: Never Used  Substance and Sexual Activity  . Alcohol use: Yes    Alcohol/week: 0.6 oz    Types: 1 Glasses of wine per week    Comment: occasionally  . Drug use: No  . Sexual activity: Yes  Other Topics Concern  . Not on file  Social History Narrative  . Not on file    Medications:   Current Outpatient Medications on File Prior to Visit  Medication Sig Dispense Refill  . ALPRAZolam (XANAX) 0.5 MG tablet TAKE 1/2 TO 1 TABLET BY MOUTH TWICE A DAY AS NEEDED 30 tablet o  . aspirin EC 81 MG tablet Take 1 tablet (81 mg total) by mouth daily. 90 tablet 3  . atorvastatin (LIPITOR) 40 MG tablet Take 1 tablet (40 mg total) by mouth daily. 90 tablet 3  . diltiazem (CARDIZEM CD) 120 MG 24 hr capsule Take 1 capsule (120 mg total) by mouth daily. 30 capsule 11  . levothyroxine (SYNTHROID, LEVOTHROID) 112 MCG tablet Take 112 mcg by mouth daily.  6   No current facility-administered medications on file prior to visit.     Allergies:   Allergies  Allergen Reactions  . Peanut-Containing Drug Products Swelling    Physical Exam General: well developed, well nourished Middle-age Caucasian lady, seated, in no evident distress.Appears anxious Head: head normocephalic and atraumatic.   Neck: supple with no carotid or supraclavicular bruits Cardiovascular: regular rate and rhythm, no murmurs Musculoskeletal: no deformity Skin:  no rash/petichiae Vascular:  Normal pulses all extremities  Neurologic Exam Mental Status: Awake and fully alert. Oriented to place and time. Recent and remote memory intact. Attention span, concentration and fund of knowledge appropriate. Mood and affect appropriate.   Cranial Nerves: Fundoscopic exam reveals sharp disc margins. Pupils equal, briskly reactive to light. Extraocular movements full without nystagmus. Visual fields full to confrontation. Hearing intact. Facial sensation intact. Face, tongue, palate moves normally and symmetrically.  Motor: Normal bulk and tone. Normal strength in all tested extremity muscles. Sensory.: intact to touch , pinprick , position and vibratory sensation. No sensory loss in the right foot. Coordination: Rapid alternating movements normal in all extremities. Finger-to-nose and heel-to-shin performed accurately bilaterally. Gait and Station: Arises from chair without difficulty. Stance is normal. Gait demonstrates normal stride length and balance . Able to heel, toe and tandem walk without difficulty.  Reflexes: 1+ and symmetric. Toes downgoing.       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 Significant underlying anxiety and stress. Vascular risk factors of hypertension and hyperlipidemia. New complaints of  right leg tingling of unclear etiology.    PLAN: I had a long d/w patient about her remote TIA, risk for recurrent stroke/TIAs, personally independently reviewed imaging studies and stroke evaluation results and answered questions.Continue aspirin 81 mg daily  for secondary stroke prevention and maintain strict control of hypertension with blood pressure goal below 130/90, diabetes with hemoglobin A1c goal below 6.5% and lipids with LDL cholesterol goal below 70 mg/dL. I also advised the patient to eat a healthy diet with plenty of whole grains, cereals, fruits and vegetables, exercise regularly and maintain ideal body weight .I advised the patient to follow-up with her primary care physician for her left foot numbness if it does not improve in a week or so. I also advised her to do regular activities for stress relaxation like walking, swimming, medication and  yoga. No need for routine scheduled follow-up appointment but she may be referred back by her primary physician in the future as needed.Greater than 50% time during this 25 minute visit was spent in counseling and coordination of care about her left foot tingling, recent TIA and answering questions   Antony Contras, [facility] Pager: [phone] 10/09/2017 2:30 PM Note: This document was prepared with digital dictation and possible smart phrase technology. Any transcriptional errors that result from this process are unintentional.

## 2018-02-04 ENCOUNTER — Ambulatory Visit: Payer: BC Managed Care – PPO | Admitting: Medical

## 2018-02-04 ENCOUNTER — Ambulatory Visit: Payer: Self-pay | Admitting: *Deleted

## 2018-02-04 ENCOUNTER — Other Ambulatory Visit: Payer: Self-pay | Admitting: Family Medicine

## 2018-02-04 ENCOUNTER — Encounter: Payer: Self-pay | Admitting: Medical

## 2018-02-04 ENCOUNTER — Ambulatory Visit: Payer: BC Managed Care – PPO | Admitting: Family Medicine

## 2018-02-04 VITALS — BP 110/78 | HR 66 | Temp 98.3°F | Resp 16 | Ht 67.0 in | Wt 183.4 lb

## 2018-02-04 DIAGNOSIS — R002 Palpitations: Secondary | ICD-10-CM

## 2018-02-04 DIAGNOSIS — E039 Hypothyroidism, unspecified: Secondary | ICD-10-CM | POA: Diagnosis not present

## 2018-02-04 LAB — TROPONIN I: TNIDX: 0.01 ug/L (ref 0.00–0.06)

## 2018-02-04 LAB — TSH: TSH: 1.35 u[IU]/mL (ref 0.35–4.50)

## 2018-02-04 LAB — T4, FREE: FREE T4: 1.07 ng/dL (ref 0.60–1.60)

## 2018-02-04 NOTE — Telephone Encounter (Signed)
FYI to Edward.  

## 2018-02-04 NOTE — Progress Notes (Signed)
Subjective:    Patient ID: Alexandria [name], female    DOB: January 04, 1961, 57 y.o.   MRN: [id number]  HPI  Pt in for evaluation of palpitations. She states last night palpitations lasted for about one hour. She states she got anxious about palpitations. She took xanax and she felt less anxious and palpitations eventually did stop. Pt states this morning around 6:30  getting ready for work had palpitation for about 30 minutes.   Pt in past told she had CVA/TIA. Pt told in past she holter monitor was normal in summer but  2 days later states had  TIA following palpitations. Pt states that time in summer she had palpitations associated[place] CVA[place]rythmia.  Pt has been followed by Dr. Leonie Man. She states neurologist has released her and she is working on reducing risk factors.   Pt did have some palpitations. Last night. She felt probably should have gone to ED. But last ED visit cost her $16,000 work up.  Pt has stopped coffee.   Pt does have apple watch. She saw some changes her readings. She did see bounce of her pulse from 60-120 at one point. Then later showed me she did have spike up to 136 on her apple watch.    Review of Systems  Constitutional: Negative for chills, fatigue and fever.  Respiratory: Negative for chest tightness, shortness of breath and wheezing.   Cardiovascular: Positive for palpitations. Negative for chest pain.  Gastrointestinal: Negative for abdominal distention, anal bleeding, constipation and diarrhea.  Genitourinary: Negative for dysuria and frequency.  Musculoskeletal: Negative for back pain and joint swelling.  Skin: Negative for rash.  Neurological: Negative for dizziness, seizures, speech difficulty, weakness and light-headedness.  Hematological: Negative for adenopathy. Does not bruise/bleed easily.  Psychiatric/Behavioral: Negative for behavioral problems and confusion. The patient is not  nervous/anxious.      Past Medical History:  Diagnosis Date  . Anxiety   . Graves disease   . Hyperlipidemia   . Hypertension   . Stroke [facility])      Social History   Socioeconomic History  . Marital status: Divorced    Spouse name: Not on file  . Number of children: Not on file  . Years of education: Not on file  . Highest education level: Not on file  Occupational History  . Not on file  Social Needs  . Financial resource strain: Not on file  . Food insecurity:    Worry: Not on file    Inability: Not on file  . Transportation needs:    Medical: Not on file    Non-medical: Not on file  Tobacco Use  . Smoking status: Never Smoker  . Smokeless tobacco: Never Used  Substance and Sexual Activity  . Alcohol use: Yes    Alcohol/week: 0.6 oz    Types: 1 Glasses of wine per week    Comment: occasionally  . Drug use: No  . Sexual activity: Yes  Lifestyle  . Physical activity:    Days per week: Not on file    Minutes per session: Not on file  . Stress: Not on file  Relationships  . Social connections:    Talks on phone: Not on file    Gets together: Not on file    Attends religious service: Not on file    Active member of club or organization: Not on file    Attends meetings of clubs or  organizations: Not on file    Relationship status: Not on file  . Intimate partner violence:    Fear of current or ex partner: Not on file    Emotionally abused: Not on file    Physically abused: Not on file    Forced sexual activity: Not on file  Other Topics Concern  . Not on file  Social History Narrative  . Not on file    Past Surgical History:  Procedure Laterality Date  . CARPAL TUNNEL RELEASE      Family History  Problem Relation Age of Onset  . Coronary artery disease Father   . Atrial fibrillation Father   . Diabetes Maternal Grandmother   . Hyperlipidemia Mother   . Dementia Mother   . Coronary artery disease Paternal Grandfather   . Coronary artery disease  Paternal Uncle     Allergies  Allergen Reactions  . Peanut-Containing Drug Products Swelling    Current Outpatient Medications on File Prior to Visit  Medication Sig Dispense Refill  . ALPRAZolam (XANAX) 0.5 MG tablet TAKE 1/2 TO 1 TABLET BY MOUTH TWICE A DAY AS NEEDED 30 tablet o  . aspirin EC 81 MG tablet Take 1 tablet (81 mg total) by mouth daily. 90 tablet 3  . atorvastatin (LIPITOR) 40 MG tablet Take 1 tablet (40 mg total) by mouth daily. 90 tablet 3  . levothyroxine (SYNTHROID, LEVOTHROID) 112 MCG tablet Take 112 mcg by mouth daily.  6  . diltiazem (CARDIZEM CD) 120 MG 24 hr capsule Take 1 capsule (120 mg total) by mouth daily. 30 capsule 11   No current facility-administered medications on file prior to visit.     BP 110/78   Pulse 66   Temp 98.3 F (36.8 C) (Oral)   Resp 16   Ht 5\' 7"  (1.702 m)   Wt 183 lb 6.4 oz (83.2 kg)   LMP 01/26/2012   SpO2 100%   BMI 28.72 kg/m       Objective:   Physical Exam   General Mental Status- Alert. General Appearance- Not in acute distress.   Skin General: Color- Normal Color. Moisture- Normal Moisture.  Neck Carotid Arteries- Normal color. Moisture- Normal Moisture. No carotid bruits. No JVD.  Chest and Lung Exam Auscultation: Breath Sounds:-Normal.  Cardiovascular Auscultation:Rythm- Regular. Murmurs & Other Heart Sounds:Auscultation of the heart reveals- No Murmurs.  Abdomen Inspection:-Inspeection Normal. Palpation/Percussion:Note:No mass. Palpation and Percussion of the abdomen reveal- Non Tender, Non Distended + BS, no rebound or guarding.    Neurologic Cranial Nerve exam:- CN III-XII intact(No nystagmus), symmetric smile. Drift Test:- No drift. Romberg Exam:- Negative.  Heal to Toe Gait exam:-Normal. Finger to Nose:- Normal/Intact Strength:- 5/5 equal and symmetric strength both upper and lower extremities.     Assessment & Plan:  For your history of palpitations and recurrent palpitations last  night as well as this morning, I am going to refer you to your cardiologist.  I am asking them they can see you within a couple of days.  If you have any recurrent palpitations please let us know.  If any severe sustained type palpitations would recommend ED evaluation.  He had normal neurologic exam today and that is reassuring as in the past he had TIA palpitations.  Your heart sounded normal today on exam.  Your EKG compared to previous one in February 07, 2017 looks virtually the same in my opinion.  No obvious changes. Normal sinus rhythm.  EKG machine read old infarct but after reviewing present  EKG and comparing EKG to prior I do not agree with computer presently.  For history of hypothyroidism we will check your TSH and T4.  Continue to avoid caffeine.  Follow-up appointment with [place] would depend if we can get you in with cardiologist quickly.  If referral to your regular cardiologist is delayed and you have recurrent symptoms then I might be able to arrange evaluation of stairs with cardiologist at our med center.  Mackie Pai, PA-C

## 2018-02-04 NOTE — Telephone Encounter (Signed)
Pt called stating that she had palpitations that started last night and lasted about an hour because she took a xanax; the palpitations started again this morning about 0630 and lasted about 20 minutes;  Recommendations made per nurse triage protocol to include seeing a physcian within 4 hours; pt would like to be seen in office; she normally sees Dr Etter Sjogren but this provider has no availability within the parameters set per protocol; pt offered and accepted appointment with Mackie Pai today at [phone]; pt verbalizes understanding; will route to office for notification of this upcoming appointment.  Reason for Disposition . [1] Heart beating very rapidly (e.g., > 140 / minute) AND [2] not present now  (Exception: during exercise)  Answer Assessment - Initial Assessment Questions 1. DESCRIPTION: "Please describe your heart rate or heart beat that you are having" (e.g., fast/slow, regular/irregular, skipped or extra beats, "palpitations")     palpitations 2. ONSET: "When did it start?" (Minutes, hours or days)      02/03/18 3. DURATION: "How long does it last" (e.g., seconds, minutes, hours)     minutes 4. PATTERN "Does it come and go, or has it been constant since it started?"  "Does it get worse with exertion?"   "Are you feeling it now?"     Comes and goes 5. TAP: "Using your hand, can you tap out what you are feeling on a chair or table in front of you, so that I can hear?" (Note: not all patients can do this)       n/a 6. HEART RATE: "Can you tell me your heart rate?" "How many beats in 15 seconds?"  (Note: not all patients can do this)       55-120 beats per minute per apple watch 7. RECURRENT SYMPTOM: "Have you ever had this before?" If so, ask: "When was the last time?" and "What happened that time?"      Per cardiology lower chambers have extra beats 8. CAUSE: "What do you think is causing the palpitations?"     unsure 9. CARDIAC HISTORY: "Do you have any history of heart disease?" (e.g.,  heart attack, angina, bypass surgery, angioplasty, arrhythmia)      arrhythmia 10. OTHER SYMPTOMS: "Do you have any other symptoms?" (e.g., dizziness, chest pain, sweating, difficulty breathing)       Lightheaded, dizziness, queasy 11. PREGNANCY: "Is there any chance you are pregnant?" "When was your last menstrual period?"       No menopause  Protocols used: HEART RATE AND HEARTBEAT QUESTIONS-A-AH

## 2018-02-04 NOTE — Patient Instructions (Addendum)
For your history of palpitations and recurrent palpitations last night as well as this morning, I am going to refer you to your cardiologist.  I am asking them they can see you within a couple of days.  If you have any recurrent palpitations please let us know.  If any severe sustained type palpitations would recommend ED evaluation.  He had normal neurologic exam today and that is reassuring as in the past he had TIA palpitations.  Your heart sounded normal today on exam.  Your EKG compared to previous one in February 07, 2017 looks virtually the same in my opinion.  No obvious changes. Normal sinus rhythm.  For history of hypothyroidism we will check your TSH and T4.  Continue to avoid caffeine.  Follow-up appointment with [place] would depend if we can get you in with cardiologist quickly.  If referral to your regular cardiologist is delayed and you have recurrent symptoms then I might be able to arrange evaluation of stairs with cardiologist at our med center.

## 2018-02-14 ENCOUNTER — Telehealth: Payer: Self-pay

## 2018-02-14 NOTE — Telephone Encounter (Signed)
Called patient today regarding her PCP's recommendation for an OV with Dr Caryl Comes. I was concerned with the frequency of her heart palpitations and history of TIA associated with her palpitations. Currently pt is on ASA and diltiazem.  Pt stated she has been feeling well and has not had any additional symptoms since she has seen her PCP on 4/01. I offered her a DOD or APP spot for tomorrow and she declined and felt she could give [place] a call when she started having symptoms. I advised her I thought it would be worth the time to come in for an OV with Dr Caryl Comes to discuss her recent symptoms. She agreed and I will have scheduling follow up with her to arrange and appointment in the near future.

## 2018-02-22 ENCOUNTER — Ambulatory Visit: Payer: BC Managed Care – PPO | Admitting: Internal Medicine

## 2018-02-22 VITALS — BP 114/76 | HR 62 | Ht 67.0 in | Wt 186.0 lb

## 2018-02-22 DIAGNOSIS — I4729 Other ventricular tachycardia: Secondary | ICD-10-CM

## 2018-02-22 DIAGNOSIS — I472 Ventricular tachycardia: Secondary | ICD-10-CM

## 2018-02-22 DIAGNOSIS — G459 Transient cerebral ischemic attack, unspecified: Secondary | ICD-10-CM

## 2018-02-22 DIAGNOSIS — R002 Palpitations: Secondary | ICD-10-CM

## 2018-02-22 NOTE — Progress Notes (Signed)
      Patient Care Team: Carollee Herter, Alferd Apa, DO as PCP - General Jacelyn Pi, MD as Consulting Physician (Endocrinology)   HPI  Alexandria [name] is a 57 y.o. female Seen because of ongoing problems with palpitations.  She has had a recently identified TIA for which she underwent evaluation including a negative transcranial Doppler  She had 2 episodes of palpitations in the last month 1 of which lasted 30 minutes 1 of which lasted 20 minutes. They were similar to her prior episodes of nonsustained ventricular tachycardia;  this monitor was reviewed from 2/18.  It showed number of episodes of nonsustained VT.  None of them lasted minutes.  MRI scanning was recommended but not accomplished  Records and Results Reviewed   Past Medical History:  Diagnosis Date  . Anxiety   . Graves disease   . Hyperlipidemia   . Hypertension   . Stroke [facility])     Past Surgical History:  Procedure Laterality Date  . CARPAL TUNNEL RELEASE      Current Meds  Medication Sig  . ALPRAZolam (XANAX) 0.5 MG tablet TAKE 1/2 TO 1 TABLET BY MOUTH TWICE A DAY AS NEEDED  . aspirin 81 MG EC tablet TAKE 1 TABLET (81 MG TOTAL) BY MOUTH DAILY.  Marland Kitchen atorvastatin (LIPITOR) 40 MG tablet TAKE 1 TABLET (40 MG TOTAL) BY MOUTH DAILY.  Marland Kitchen levothyroxine (SYNTHROID, LEVOTHROID) 112 MCG tablet Take 112 mcg by mouth daily.    Allergies  Allergen Reactions  . Peanut-Containing Drug Products Swelling      Review of Systems negative except from HPI and PMH  Physical Exam BP 114/76   Pulse 62   Ht 5\' 7"  (1.702 m)   Wt 186 lb (84.4 kg)   LMP 01/26/2012   BMI 29.13 kg/m  Well developed and nourished in no acute distress HENT normal Neck supple with JVP-flat Clear Regular rate and rhythm, no murmurs or gallops Abd-soft with active BS No Clubbing cyanosis edema Skin-warm and dry A & Oriented  Grossly normal sensory and motor function  ECG demonstrates sinus rhythm at 62 Intervals 18/09/41 Left ventricular  hypertrophy by voltage   Assessment and  Plan  Palpitations  Ventricular tachycardia-nonsustained  TIA   The patient has had recurrent palpitations.  In her mind you are similar to her prior documented nonsustained ventricular tachycardia.  We will use [employer] monitor try to discern the mechanism underlying her palpitations.  Will probably be appropriate to pursue MRI scanning to exclude structural heart disease  With her history of TIA, it would also be important to exclude atrial fibrillation  We spent more than 50% of our >25 min visit in face to face counseling regarding the above    Current medicines are reviewed at length with the patient today .  The patient does not  have concerns regarding medicines.

## 2018-02-22 NOTE — Patient Instructions (Signed)
Medication Instructions:  Your physician recommends that you continue on your current medications as directed. Please refer to the Current Medication list given to you today.   Labwork: None ordered   Testing/Procedures: None ordered   Follow-Up: Your physician recommends that you schedule a follow-up appointment in: 3 months with Dr Klein   Any Other Special Instructions Will Be Listed Below (If Applicable).     If you need a refill on your cardiac medications before your next appointment, please call your pharmacy.   

## 2018-02-24 ENCOUNTER — Other Ambulatory Visit: Payer: Self-pay | Admitting: Nurse Practitioner

## 2018-02-25 NOTE — Telephone Encounter (Signed)
Called and confirmed pt is still taking Diltiazem ER 120mg  qd

## 2018-04-04 ENCOUNTER — Emergency Department (HOSPITAL_BASED_OUTPATIENT_CLINIC_OR_DEPARTMENT_OTHER): Payer: BC Managed Care – PPO

## 2018-04-04 ENCOUNTER — Other Ambulatory Visit: Payer: Self-pay

## 2018-04-04 ENCOUNTER — Observation Stay (HOSPITAL_BASED_OUTPATIENT_CLINIC_OR_DEPARTMENT_OTHER)
Admission: EM | Admit: 2018-04-04 | Discharge: 2018-04-05 | Disposition: A | Discharge status: Home / Self Care | Payer: BC Managed Care – PPO | Attending: Family Medicine | Admitting: Family Medicine

## 2018-04-04 ENCOUNTER — Encounter (HOSPITAL_BASED_OUTPATIENT_CLINIC_OR_DEPARTMENT_OTHER): Payer: Self-pay

## 2018-04-04 DIAGNOSIS — F419 Anxiety disorder, unspecified: Secondary | ICD-10-CM | POA: Diagnosis not present

## 2018-04-04 DIAGNOSIS — I10 Essential (primary) hypertension: Secondary | ICD-10-CM | POA: Diagnosis not present

## 2018-04-04 DIAGNOSIS — K449 Diaphragmatic hernia without obstruction or gangrene: Secondary | ICD-10-CM | POA: Insufficient documentation

## 2018-04-04 DIAGNOSIS — Z79899 Other long term (current) drug therapy: Secondary | ICD-10-CM | POA: Diagnosis not present

## 2018-04-04 DIAGNOSIS — I4891 Unspecified atrial fibrillation: Secondary | ICD-10-CM | POA: Diagnosis present

## 2018-04-04 DIAGNOSIS — I639 Cerebral infarction, unspecified: Secondary | ICD-10-CM | POA: Diagnosis present

## 2018-04-04 DIAGNOSIS — R0789 Other chest pain: Secondary | ICD-10-CM | POA: Diagnosis not present

## 2018-04-04 DIAGNOSIS — Z8673 Personal history of transient ischemic attack (TIA), and cerebral infarction without residual deficits: Secondary | ICD-10-CM | POA: Diagnosis not present

## 2018-04-04 DIAGNOSIS — I480 Paroxysmal atrial fibrillation: Secondary | ICD-10-CM | POA: Diagnosis not present

## 2018-04-04 DIAGNOSIS — E876 Hypokalemia: Secondary | ICD-10-CM | POA: Diagnosis not present

## 2018-04-04 DIAGNOSIS — Z79890 Hormone replacement therapy (postmenopausal): Secondary | ICD-10-CM | POA: Insufficient documentation

## 2018-04-04 DIAGNOSIS — E785 Hyperlipidemia, unspecified: Secondary | ICD-10-CM | POA: Diagnosis present

## 2018-04-04 DIAGNOSIS — Z7982 Long term (current) use of aspirin: Secondary | ICD-10-CM | POA: Diagnosis not present

## 2018-04-04 DIAGNOSIS — I472 Ventricular tachycardia: Secondary | ICD-10-CM | POA: Diagnosis not present

## 2018-04-04 DIAGNOSIS — E0500 Thyrotoxicosis with diffuse goiter without thyrotoxic crisis or storm: Secondary | ICD-10-CM | POA: Diagnosis not present

## 2018-04-04 HISTORY — DX: Other nonspecific abnormal finding of lung field: R91.8

## 2018-04-04 HISTORY — DX: Diaphragmatic hernia without obstruction or gangrene: K44.9

## 2018-04-04 HISTORY — DX: Other ventricular tachycardia: I47.29

## 2018-04-04 HISTORY — DX: Ventricular tachycardia: I47.2

## 2018-04-04 HISTORY — DX: Transient cerebral ischemic attack, unspecified: G45.9

## 2018-04-04 LAB — BASIC METABOLIC PANEL
ANION GAP: 8 (ref 5–15)
BUN: 12 mg/dL (ref 6–20)
CALCIUM: 8.8 mg/dL — AB (ref 8.9–10.3)
CO2: 24 mmol/L (ref 22–32)
CREATININE: 0.76 mg/dL (ref 0.44–1.00)
Chloride: 107 mmol/L (ref 101–111)
Glucose, Bld: 144 mg/dL — ABNORMAL HIGH (ref 65–99)
Potassium: 3.3 mmol/L — ABNORMAL LOW (ref 3.5–5.1)
Sodium: 139 mmol/L (ref 135–145)

## 2018-04-04 LAB — CBC
HCT: 40.6 % (ref 36.0–46.0)
HEMOGLOBIN: 14.1 g/dL (ref 12.0–15.0)
MCH: 30.7 pg (ref 26.0–34.0)
MCHC: 34.7 g/dL (ref 30.0–36.0)
MCV: 88.3 fL (ref 78.0–100.0)
PLATELETS: 292 10*3/uL (ref 150–400)
RBC: 4.6 MIL/uL (ref 3.87–5.11)
RDW: 13.8 % (ref 11.5–15.5)
WBC: 8 10*3/uL (ref 4.0–10.5)

## 2018-04-04 LAB — TROPONIN I

## 2018-04-04 MED ORDER — POTASSIUM CHLORIDE CRYS ER 20 MEQ PO TBCR
40.0000 meq | EXTENDED_RELEASE_TABLET | Freq: Once | ORAL | Status: AC
  Administered 2018-04-04: 40 meq via ORAL
  Filled 2018-04-04: qty 2

## 2018-04-04 MED ORDER — DILTIAZEM HCL 100 MG IV SOLR
5.0000 mg/h | INTRAVENOUS | Status: DC
  Administered 2018-04-04: 5 mg/h via INTRAVENOUS
  Filled 2018-04-04 (×2): qty 100

## 2018-04-04 MED ORDER — DILTIAZEM LOAD VIA INFUSION
5.0000 mg | Freq: Once | INTRAVENOUS | Status: AC
  Administered 2018-04-04: 5 mg via INTRAVENOUS
  Filled 2018-04-04: qty 5

## 2018-04-04 NOTE — ED Triage Notes (Signed)
Pt c/o palpitations and irregular heart beat, atrial fib was caught on her app from the cardiologist, also c/o lightheadedness, everything started at 2030

## 2018-04-05 ENCOUNTER — Encounter (HOSPITAL_COMMUNITY): Payer: Self-pay

## 2018-04-05 ENCOUNTER — Observation Stay (HOSPITAL_BASED_OUTPATIENT_CLINIC_OR_DEPARTMENT_OTHER): Payer: BC Managed Care – PPO

## 2018-04-05 ENCOUNTER — Telehealth: Payer: Self-pay | Admitting: *Deleted

## 2018-04-05 ENCOUNTER — Other Ambulatory Visit: Payer: Self-pay | Admitting: Physician Assistant

## 2018-04-05 DIAGNOSIS — Z79890 Hormone replacement therapy (postmenopausal): Secondary | ICD-10-CM | POA: Diagnosis not present

## 2018-04-05 DIAGNOSIS — R0683 Snoring: Secondary | ICD-10-CM

## 2018-04-05 DIAGNOSIS — I480 Paroxysmal atrial fibrillation: Secondary | ICD-10-CM | POA: Diagnosis not present

## 2018-04-05 DIAGNOSIS — I639 Cerebral infarction, unspecified: Secondary | ICD-10-CM

## 2018-04-05 DIAGNOSIS — Z7982 Long term (current) use of aspirin: Secondary | ICD-10-CM | POA: Diagnosis not present

## 2018-04-05 DIAGNOSIS — I4891 Unspecified atrial fibrillation: Secondary | ICD-10-CM | POA: Diagnosis not present

## 2018-04-05 DIAGNOSIS — F419 Anxiety disorder, unspecified: Secondary | ICD-10-CM | POA: Diagnosis not present

## 2018-04-05 DIAGNOSIS — R0789 Other chest pain: Secondary | ICD-10-CM | POA: Diagnosis not present

## 2018-04-05 DIAGNOSIS — K449 Diaphragmatic hernia without obstruction or gangrene: Secondary | ICD-10-CM | POA: Diagnosis not present

## 2018-04-05 DIAGNOSIS — E785 Hyperlipidemia, unspecified: Secondary | ICD-10-CM

## 2018-04-05 DIAGNOSIS — Z8673 Personal history of transient ischemic attack (TIA), and cerebral infarction without residual deficits: Secondary | ICD-10-CM | POA: Diagnosis present

## 2018-04-05 DIAGNOSIS — E7800 Pure hypercholesterolemia, unspecified: Secondary | ICD-10-CM

## 2018-04-05 DIAGNOSIS — I10 Essential (primary) hypertension: Secondary | ICD-10-CM | POA: Diagnosis not present

## 2018-04-05 DIAGNOSIS — E0500 Thyrotoxicosis with diffuse goiter without thyrotoxic crisis or storm: Secondary | ICD-10-CM | POA: Diagnosis not present

## 2018-04-05 DIAGNOSIS — I472 Ventricular tachycardia: Secondary | ICD-10-CM | POA: Diagnosis not present

## 2018-04-05 DIAGNOSIS — Z79899 Other long term (current) drug therapy: Secondary | ICD-10-CM | POA: Diagnosis not present

## 2018-04-05 DIAGNOSIS — E876 Hypokalemia: Secondary | ICD-10-CM | POA: Diagnosis not present

## 2018-04-05 LAB — MRSA PCR SCREENING: MRSA BY PCR: NEGATIVE

## 2018-04-05 LAB — BRAIN NATRIURETIC PEPTIDE: B Natriuretic Peptide: 120.4 pg/mL — ABNORMAL HIGH (ref 0.0–100.0)

## 2018-04-05 LAB — TROPONIN I
Troponin I: <0.03 ng/mL (ref <0.03)
Troponin I: <0.03 ng/mL (ref <0.03)

## 2018-04-05 LAB — ECHOCARDIOGRAM COMPLETE
HEIGHTINCHES: 67 in
Weight: 2931.24 oz

## 2018-04-05 LAB — HIV ANTIBODY (ROUTINE TESTING W REFLEX): HIV SCREEN 4TH GENERATION: NONREACTIVE

## 2018-04-05 MED ORDER — ZOLPIDEM TARTRATE 5 MG PO TABS: 5.0000 mg | ORAL_TABLET | Freq: Every evening | ORAL | Status: DC | PRN

## 2018-04-05 MED ORDER — SODIUM CHLORIDE 0.9 % IV SOLN
INTRAVENOUS | Status: DC
  Administered 2018-04-05: 05:00:00 via INTRAVENOUS

## 2018-04-05 MED ORDER — DILTIAZEM HCL 60 MG PO TABS: 30.0000 mg | ORAL_TABLET | Freq: Every day | ORAL | Status: DC | PRN

## 2018-04-05 MED ORDER — HYDRALAZINE HCL 20 MG/ML IJ SOLN: 5.0000 mg | INTRAMUSCULAR | Status: DC | PRN

## 2018-04-05 MED ORDER — DILTIAZEM HCL ER COATED BEADS 180 MG PO CP24: 180.0000 mg | ORAL_CAPSULE | Freq: Every day | ORAL | 3 refills | Status: DC

## 2018-04-05 MED ORDER — LEVOTHYROXINE SODIUM 112 MCG PO TABS
112.0000 ug | ORAL_TABLET | Freq: Every day | ORAL | Status: DC
  Administered 2018-04-05: 112 ug via ORAL
  Filled 2018-04-05: qty 1

## 2018-04-05 MED ORDER — DILTIAZEM HCL ER COATED BEADS 180 MG PO CP24: 180.0000 mg | ORAL_CAPSULE | Freq: Every day | ORAL | Status: DC

## 2018-04-05 MED ORDER — POTASSIUM CHLORIDE ER 10 MEQ PO TBCR: 10.0000 meq | EXTENDED_RELEASE_TABLET | Freq: Every day | ORAL | 3 refills | Status: DC

## 2018-04-05 MED ORDER — ALPRAZOLAM 0.5 MG PO TABS: 0.5000 mg | ORAL_TABLET | Freq: Two times a day (BID) | ORAL | Status: DC | PRN

## 2018-04-05 MED ORDER — ASPIRIN EC 81 MG PO TBEC
81.0000 mg | DELAYED_RELEASE_TABLET | Freq: Every day | ORAL | Status: DC
  Administered 2018-04-05: 81 mg via ORAL
  Filled 2018-04-05: qty 1

## 2018-04-05 MED ORDER — ATORVASTATIN CALCIUM 40 MG PO TABS
40.0000 mg | ORAL_TABLET | Freq: Every day | ORAL | Status: DC
  Administered 2018-04-05: 40 mg via ORAL
  Filled 2018-04-05: qty 1

## 2018-04-05 MED ORDER — SODIUM CHLORIDE 0.9 % IV SOLN
INTRAVENOUS | Status: DC
  Administered 2018-04-05: 125 mL/h via INTRAVENOUS

## 2018-04-05 MED ORDER — POTASSIUM CHLORIDE CRYS ER 20 MEQ PO TBCR
20.0000 meq | EXTENDED_RELEASE_TABLET | Freq: Every day | ORAL | Status: DC
  Administered 2018-04-05: 20 meq via ORAL
  Filled 2018-04-05: qty 1

## 2018-04-05 MED ORDER — DILTIAZEM HCL 30 MG PO TABS: 30.0000 mg | ORAL_TABLET | Freq: Four times a day (QID) | ORAL | 1 refills | Status: DC | PRN

## 2018-04-05 MED ORDER — HEPARIN (PORCINE) IN NACL 100-0.45 UNIT/ML-% IJ SOLN: 1100.0000 [IU]/h | INTRAMUSCULAR | Status: DC

## 2018-04-05 MED ORDER — APIXABAN 5 MG PO TABS: 5.0000 mg | ORAL_TABLET | Freq: Two times a day (BID) | ORAL | 5 refills | Status: DC

## 2018-04-05 MED ORDER — ACETAMINOPHEN 325 MG PO TABS: 650.0000 mg | ORAL_TABLET | ORAL | 0 refills | Status: DC | PRN

## 2018-04-05 MED ORDER — ONDANSETRON HCL 4 MG/2ML IJ SOLN: 4.0000 mg | Freq: Four times a day (QID) | INTRAMUSCULAR | Status: DC | PRN

## 2018-04-05 MED ORDER — ENOXAPARIN SODIUM 80 MG/0.8ML ~~LOC~~ SOLN
1.0000 mg/kg | Freq: Once | SUBCUTANEOUS | Status: AC
  Administered 2018-04-05: 80 mg via SUBCUTANEOUS
  Filled 2018-04-05: qty 0.8

## 2018-04-05 MED ORDER — SODIUM CHLORIDE 0.9 % IV BOLUS
500.0000 mL | Freq: Once | INTRAVENOUS | Status: AC
  Administered 2018-04-05: 500 mL via INTRAVENOUS

## 2018-04-05 MED ORDER — APIXABAN 5 MG PO TABS
5.0000 mg | ORAL_TABLET | Freq: Two times a day (BID) | ORAL | Status: DC
  Administered 2018-04-05: 5 mg via ORAL
  Filled 2018-04-05: qty 1

## 2018-04-05 MED ORDER — DILTIAZEM HCL ER COATED BEADS 120 MG PO CP24
120.0000 mg | ORAL_CAPSULE | Freq: Every day | ORAL | Status: DC
  Administered 2018-04-05: 120 mg via ORAL
  Filled 2018-04-05: qty 1

## 2018-04-05 MED ORDER — ACETAMINOPHEN 325 MG PO TABS: 650.0000 mg | ORAL_TABLET | ORAL | Status: DC | PRN

## 2018-04-05 MED ORDER — DILTIAZEM HCL 30 MG PO TABS: 30.0000 mg | ORAL_TABLET | Freq: Every day | ORAL | 1 refills | Status: DC | PRN

## 2018-04-05 NOTE — Progress Notes (Signed)
  Echocardiogram 2D Echocardiogram has been performed.  Rutilio Yellowhair G Kmya Placide 04/05/2018, 11:17 AM

## 2018-04-05 NOTE — Progress Notes (Signed)
Alexandria [name] for heparin Indication: atrial fibrillation  Allergies  Allergen Reactions  . Peanut-Containing Drug Products Swelling    Patient Measurements: Height: 5\' 7"  (170.2 cm) Weight: 183 lb 3.2 oz (83.1 kg) IBW/kg (Calculated) : 61.6 Heparin Dosing Weight: 78.8  Vital Signs: Temp: 98.6 F (37 C) (05/31 1533) Temp Source: Oral (05/31 1533) BP: 128/71 (05/31 1533) Pulse Rate: 56 (05/31 1533)  Labs: Recent Labs    04/04/18 2243 04/04/18 2244 04/05/18 0601 04/05/18 1136  HGB 14.1  --   --   --   HCT 40.6  --   --   --   PLT 292  --   --   --   CREATININE 0.76  --   --   --   TROPONINI  --  <0.03 <0.03 <0.03    Estimated Creatinine Clearance: 84.9 mL/min (by C-G formula based on SCr of 0.76 mg/dL).   Medical History: Past Medical History:  Diagnosis Date  . Anxiety   . Graves disease   . Hiatal hernia    a. seen on CT 12/2016.  Marland Kitchen Hyperlipidemia   . Hypertension   . NSVT (nonsustained ventricular tachycardia) ([place])   . Pulmonary nodules    a. seen on CT 12/2016.  . Stroke ([place])   . TIA (transient ischemic attack) 01/2017   a. tx at [facility].    Medications:  Medications Prior to Admission  Medication Sig Dispense Refill Last Dose  . ALPRAZolam (XANAX) 0.5 MG tablet TAKE 1/2 TO 1 TABLET BY MOUTH TWICE A DAY AS NEEDED 30 tablet o Taking  . aspirin 81 MG EC tablet TAKE 1 TABLET (81 MG TOTAL) BY MOUTH DAILY. 90 tablet 3 Taking  . atorvastatin (LIPITOR) 40 MG tablet TAKE 1 TABLET (40 MG TOTAL) BY MOUTH DAILY. 90 tablet 3 Taking  . diltiazem (CARDIZEM CD) 120 MG 24 hr capsule TAKE 1 CAPSULE (120 MG TOTAL) BY MOUTH DAILY. 30 capsule 6   . levothyroxine (SYNTHROID, LEVOTHROID) 112 MCG tablet Take 112 mcg by mouth daily.  6 Taking    Assessment: 57 yo lady started on enoxaparin then transitioned to heparin infusion for afib- now transitioning to apixaban. She spontaneously converted to NSR this AM and has been maintained.  Hgb is 14.1, plt 292.  She received 80 mg lovenox at 00:40. No signs/symptoms of bleeding.  Will order full dose given age<80, wt>60 kg, and Scr<1.5 (Scr 0.76 today).   Goal of Therapy:  Monitor platelets by anticoagulation protocol: Yes   Plan:  Discontinue heparin infusion Start apixaban 5 mg twice daily starting this afternoon Monitor for bleeding complications  Doylene Canard, PharmD Clinical Pharmacist  Pager: [phone] Phone: [phone] 04/05/2018,3:40 PM

## 2018-04-05 NOTE — ED Provider Notes (Signed)
[place] 2C CV PROGRESSIVE CARE Provider Note   CSN: [id number] Arrival date & time: 04/04/18  2218     History   Chief Complaint Chief Complaint  Patient presents with  . Palpitations    HPI Alexandria [name] is a 57 y.o. female.  The history is provided by the patient.  Palpitations   This is a recurrent problem. The current episode started 3 to 5 hours ago. The problem occurs constantly. The problem has not changed since onset.The problem is associated with an unknown factor. Associated symptoms include irregular heartbeat and dizziness. Pertinent negatives include no diaphoresis, no claudication, no nausea, no cough and no hemoptysis. Treatments tried: her home meds. The treatment provided no relief. Risk factors include post menopause. Her past medical history is significant for hyperthyroidism.  Patient has a h/o afib and cardiology told her to get an app to monitor her rate and rhythm according to the app the patient reports she flipped into AFIB this evening.    Past Medical History:  Diagnosis Date  . Anxiety   . Graves disease   . Hyperlipidemia   . Hypertension   . Stroke [facility])     Patient Active Problem List   Diagnosis Date Noted  . Atrial fibrillation with RVR ([place]) 04/05/2018  . Essential hypertension 04/05/2018  . 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 Low back pain 12/28/2015  . Whiplash 12/28/2015  . High risk sexual behavior 06/12/2013  . Hyperthyroidism 02/07/2012  . Hyperglycemia 11/27/2011  . NEVI, MULTIPLE 11/22/2010  . LOW BACK PAIN, ACUTE 04/27/2010  . LUMBAR RADICULOPATHY, LEFT 04/27/2010  . CANDIDIASIS OF VULVA AND VAGINA 11/25/2009  . HLD (hyperlipidemia) 06/07/2009  . Anxiety 10/06/2008  . GESTATIONAL DIABETES 10/06/2008  . PALPITATIONS, HX OF 10/06/2008    Past Surgical History:  Procedure Laterality Date  . CARPAL TUNNEL RELEASE       OB History   None      Home Medications    Prior to Admission medications   Medication Sig  Start Date End Date Taking? Authorizing Provider  ALPRAZolam Duanne Moron) 0.5 MG tablet TAKE 1/2 TO 1 TABLET BY MOUTH TWICE A DAY AS NEEDED 01/10/17   Saguier, Percell Miller, PA-C  aspirin 81 MG EC tablet TAKE 1 TABLET (81 MG TOTAL) BY MOUTH DAILY. 02/05/18   Carollee Herter, Alferd Apa, DO  atorvastatin (LIPITOR) 40 MG tablet TAKE 1 TABLET (40 MG TOTAL) BY MOUTH DAILY. 02/05/18   Carollee Herter, Alferd Apa, DO  diltiazem (CARDIZEM CD) 120 MG 24 hr capsule TAKE 1 CAPSULE (120 MG TOTAL) BY MOUTH DAILY. 02/25/18 05/26/18  Deboraha Sprang, MD  levothyroxine (SYNTHROID, LEVOTHROID) 112 MCG tablet Take 112 mcg by mouth daily. 03/16/17   [provider]    Family History Family History  Problem Relation Age of Onset  . Coronary artery disease Father   . Atrial fibrillation Father   . Diabetes Maternal Grandmother   . Hyperlipidemia Mother   . Dementia Mother   . Coronary artery disease Paternal Grandfather   . Coronary artery disease Paternal Uncle     Social History Social History   Tobacco Use  . Smoking status: Never Smoker  . Smokeless tobacco: Never Used  Substance Use Topics  . Alcohol use: Yes    Alcohol/week: 0.6 oz    Types: 1 Glasses of wine per week    Comment: occasionally  . Drug use: No     Allergies   Peanut-containing drug products   Review of Systems  Review of Systems  Constitutional: Negative for diaphoresis.  Respiratory: Negative for cough and hemoptysis.   Cardiovascular: Positive for palpitations. Negative for claudication.  Gastrointestinal: Negative for nausea.  Neurological: Positive for dizziness.  All other systems reviewed and are negative.    Physical Exam Updated Vital Signs BP 119/71 (BP Location: Right Arm)   Pulse 77   Temp 98.4 F (36.9 C) (Oral)   Resp 16   Ht 5\' 7"  (1.702 m)   Wt 83.1 kg (183 lb 3.2 oz)   LMP 01/26/2012   SpO2 98%   BMI 28.69 kg/m   Physical Exam  Constitutional: She is oriented to person, place, and time. She appears  well-developed and well-nourished.  HENT:  Head: Normocephalic and atraumatic.  Mouth/Throat: No oropharyngeal exudate.  Eyes: Pupils are equal, round, and reactive to light. Conjunctivae are normal.  Neck: Normal range of motion. Neck supple.  Cardiovascular: An irregularly irregular rhythm present. Tachycardia present.  Pulmonary/Chest: Effort normal and breath sounds normal. No stridor. No respiratory distress. She has no wheezes.  Abdominal: Soft. Bowel sounds are normal. There is no tenderness.  Musculoskeletal: Normal range of motion. She exhibits no edema.  Neurological: She is alert and oriented to person, place, and time.  Skin: Skin is warm and dry. Capillary refill takes less than 2 seconds.  Psychiatric: She has a normal mood and affect.  Nursing note and vitals reviewed.    ED Treatments / Results  Labs (all labs ordered are listed, but only abnormal results are displayed) Results for orders placed or performed during the hospital encounter of 04/04/18  MRSA PCR Screening  Result Value Ref Range   MRSA by PCR NEGATIVE NEGATIVE  Basic metabolic panel  Result Value Ref Range   Sodium 139 135 - 145 mmol/L   Potassium 3.3 (L) 3.5 - 5.1 mmol/L   Chloride 107 101 - 111 mmol/L   CO2 24 22 - 32 mmol/L   Glucose, Bld 144 (H) 65 - 99 mg/dL   BUN 12 6 - 20 mg/dL   Creatinine, Ser 0.76 0.44 - 1.00 mg/dL   Calcium 8.8 (L) 8.9 - 10.3 mg/dL   GFR calc non Af Amer >60 >60 mL/min   GFR calc Af Amer >60 >60 mL/min   Anion gap 8 5 - 15  CBC  Result Value Ref Range   WBC 8.0 4.0 - 10.5 K/uL   RBC 4.60 3.87 - 5.11 MIL/uL   Hemoglobin 14.1 12.0 - 15.0 g/dL   HCT 40.6 36.0 - 46.0 %   MCV 88.3 78.0 - 100.0 fL   MCH 30.7 26.0 - 34.0 pg   MCHC 34.7 30.0 - 36.0 g/dL   RDW 13.8 11.5 - 15.5 %   Platelets 292 150 - 400 K/uL  Troponin I  Result Value Ref Range   Troponin I <0.03 <0.03 ng/mL  Troponin I (q 6hr x 3)  Result Value Ref Range   Troponin I <0.03 <0.03 ng/mL  Brain  natriuretic peptide  Result Value Ref Range   B Natriuretic Peptide 120.4 (H) 0.0 - 100.0 pg/mL   Dg Chest 2 View  Result Date: 04/04/2018 CLINICAL DATA:  Acute onset of palpitations. Lightheadedness. EXAM: CHEST - 2 VIEW COMPARISON:  CT images of the chest performed 01/02/2017 FINDINGS: The lungs are well-aerated. Mild peribronchial thickening is noted. There is no evidence of focal opacification, pleural effusion or pneumothorax. The heart is normal in size; the mediastinal contour is within normal limits. No acute osseous abnormalities are  seen. IMPRESSION: Mild peribronchial thickening noted; lungs otherwise clear. Electronically Signed   By: Garald Balding M.D.   On: 04/04/2018 23:10    EKG EKG Interpretation  Date/Time:  Thursday Apr 04 2018 22:22:53 EDT Ventricular Rate:  97 PR Interval:    QRS Duration: 88 QT Interval:  356 QTC Calculation: 452 R Axis:   46 Text Interpretation:  Atrial fibrillation Confirmed by Dory Horn) on 04/04/2018 11:04:18 PM   Radiology Dg Chest 2 View  Result Date: 04/04/2018 CLINICAL DATA:  Acute onset of palpitations. Lightheadedness. EXAM: CHEST - 2 VIEW COMPARISON:  CT images of the chest performed 01/02/2017 FINDINGS: The lungs are well-aerated. Mild peribronchial thickening is noted. There is no evidence of focal opacification, pleural effusion or pneumothorax. The heart is normal in size; the mediastinal contour is within normal limits. No acute osseous abnormalities are seen. IMPRESSION: Mild peribronchial thickening noted; lungs otherwise clear. Electronically Signed   By: Garald Balding M.D.   On: 04/04/2018 23:10    Procedures Procedures (including critical care time)  Medications Ordered in ED Medications  diltiazem (CARDIZEM) 1 mg/mL load via infusion 5 mg (5 mg Intravenous Bolus from Bag 04/04/18 2335)    And  diltiazem (CARDIZEM) 100 mg in dextrose 5 % 100 mL (1 mg/mL) infusion (5 mg/hr Intravenous Rate/Dose Verify 04/05/18  0500)  0.9 %  sodium chloride infusion ( Intravenous Rate/Dose Verify 04/05/18 0550)  ALPRAZolam (XANAX) tablet 0.5 mg (has no administration in time range)  aspirin EC tablet 81 mg (has no administration in time range)  atorvastatin (LIPITOR) tablet 40 mg (has no administration in time range)  diltiazem (CARDIZEM CD) 24 hr capsule 120 mg (has no administration in time range)  levothyroxine (SYNTHROID, LEVOTHROID) tablet 112 mcg (has no administration in time range)  acetaminophen (TYLENOL) tablet 650 mg (has no administration in time range)  ondansetron (ZOFRAN) injection 4 mg (has no administration in time range)  zolpidem (AMBIEN) tablet 5 mg (has no administration in time range)  hydrALAZINE (APRESOLINE) injection 5 mg (has no administration in time range)  heparin ADULT infusion 100 units/mL (25000 units/217mL sodium chloride 0.45%) (has no administration in time range)  potassium chloride SA (K-DUR,KLOR-CON) CR tablet 40 mEq (40 mEq Oral Given 04/04/18 2311)  enoxaparin (LOVENOX) injection 80 mg (80 mg Subcutaneous Given 04/05/18 0040)  sodium chloride 0.9 % bolus 500 mL (0 mLs Intravenous Stopped 04/05/18 0203)    MDM Reviewed: previous chart, nursing note and vitals Interpretation: labs, ECG and x-ray (NACPD on cxr negative troponin) Total time providing critical care: 30-74 minutes (review of the patient's app looks like the patient has been in afib but rate controlled prior to this evening). This excludes time spent performing separately reportable procedures and services. Consults: admitting MD  CRITICAL CARE Performed by: Carlisle Beers Total critical care time: 60  minutes Critical care time was exclusive of separately billable procedures and treating other patients. Critical care was necessary to treat or prevent imminent or life-threatening deterioration. Critical care was time spent personally by me on the following activities: development of treatment plan with patient  and/or surrogate as well as nursing, discussions with consultants, evaluation of patient's response to treatment, examination of patient, obtaining history from patient or surrogate, ordering and performing treatments and interventions, ordering and review of laboratory studies, ordering and review of radiographic studies, pulse oximetry and re-evaluation of patient's condition.  Final Clinical Impressions(s) / ED Diagnoses   Final diagnoses:  Atrial fibrillation with RVR ([place])  Given h/o stroke will rate control and anticoagulate.  It appears from her app she has been in afib longer than today.     Chatara Lucente, MD 04/05/18 [phone]

## 2018-04-05 NOTE — Discharge Summary (Signed)
Alexandria [name], is a 57 y.o. female  DOB 03-18-1961  MRN [id number].  Admission date:  04/04/2018  Admitting Physician  Ivor Costa, MD  Discharge Date:  04/05/2018   Primary MD  Carollee Herter, Alferd Apa, DO  Recommendations for primary care physician for things to follow:   1)You have Paroxysmal Atrial Fibrillation-- (irregular heartbeat) 2)Take Cardizem CD 180 mg daily (up from 120 mg)  3)You may take additional Cardizem 30 mg every 24 hours as needed for palpitations/heart rate above 110 bpm 4)Your risk of stroke or mini stroke or blood clots is 4.8 %  to 6.7% each year because of your atrial fibrillation 5)Your risk of significant bleeding if you go on a blood thinner is less than 1 % per year 6)Given your history of previous mini stroke/TIA as discussed you have agreed to start Eliquis/Apixaban 5 mg twice a day to reduce your Stroke risk 7)Avoid ibuprofen/Advil/Aleve/Motrin/Goody Powders/Naproxen/BC powders/Meloxicam/Diclofenac/Indomethacin and other Nonsteroidal anti-inflammatory medications as these will make you more likely to bleed and can cause stomach ulcers, can also cause Kidney problems.  8)Follow-up with atrial fibrillation clinic around June 11th, 2019, you may also follow-up with the cardiologist Dr. Caryl Comes  for reevaluation  Admission Diagnosis  Atrial fibrillation with RVR [facility]) [I48.91]   Discharge Diagnosis  Atrial fibrillation with RVR ([place]) [I48.91]    Principal Problem:   Atrial fibrillation with RVR ([place]) Active Problems:   HLD (hyperlipidemia)   Anxiety   Essential hypertension   Stroke (cerebrum) [facility])      Past Medical History:  Diagnosis Date  . Anxiety   . Graves disease   . Hiatal hernia    a. seen on CT 12/2016.  Marland Kitchen Hyperlipidemia   . Hypertension   . NSVT (nonsustained ventricular tachycardia) ([place])   . Pulmonary nodules    a. seen on CT 12/2016.  . Stroke ([place])   . TIA  (transient ischemic attack) 01/2017   a. tx at [facility].    Past Surgical History:  Procedure Laterality Date  . CARPAL TUNNEL RELEASE         HPI  from the history and physical done on the day of admission:     Patient coming from:  The patient is coming from home.  At baseline, pt is independent for most of ADL.        Chief Complaint: Palpitation  HPI: Alexandria [name] is a 57 y.o. female with medical history significant of hypertension, hyperlipidemia, stroke, hypothyroidism, anxiety, who presents with palpitation.  Patient states that he has been having palpitation chronically.  She has been followed by cardiologist, Dr. Caryl Comes.  She has been using Apple Heart Monitor for about one month. Today, she has palpitations again, and A fib was detected by her monitor.  Patient does not have chest pain, shortness breath.  She reports lightheadedness and dizziness.  No unilateral weakness, numbness or tingling his extremities.  No facial droop, slurred speech.  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  No symptoms of UTI.  She had mild diarrhea 2 days ago, which has resolved.  Currently no nausea, vomiting, diarrhea or abdominal pain.  ED Course: pt was found to have A. fib with RVR on EKG, negative troponin, potassium 3.3, creatinine normal, temperature normal, oxygen saturation 92% on room air, chest x-ray showed mild peribronchial thickening.  Patient is placed on stepdown bed for observation      Hospital Course:    1) new onset A. Fib-----rate control is much better, echo with EF of 60 to 65%, chads score, HASBLED score reviewed with patient, please see stroke and bleeding risk as outlined in discharge instructions, being discharged on Cardizem CD 180 mg daily and apixaban 5 mg twice daily, he will follow-up with A. fib clinic and Dr. Caryl Comes, patient will also get outpatient sleep study, please see cardiology NP note... Please see discharge  instructions  2)H/o prior stroke-Eliquis and Lipitor as ordered,  Discharge Condition: stable  Follow UP  Follow-up Information    Sherran Needs, NP Follow up.   Specialties:  Nurse Practitioner, Cardiology Why:  Atrial Fib Clinic at [facility] - 04/16/18 at 3:30pm. Call clinic number as listed for staff to walk you through directions and give you the garage code. Arrive 15 minutes prior to appointment to check in. Keep follow-up with Dr. Caryl Comes in 05/2018. Contact information: [place] [address] [phone]        [facility] Office Follow up.   Specialty:  Cardiology Why:  Office will call you to arrange outpatient sleep study Contact information: [address] [place] [phone]           Consults obtained - cardiology  Diet and Activity recommendation:  As advised  Discharge Instructions    Discharge Instructions    ([place]) Call MD:  Anytime you have any of the following symptoms: 1) 3 pound weight gain in 24 hours or 5 pounds in 1 week 2) shortness of breath, with or without a dry hacking cough 3) swelling in the hands, feet or stomach 4) if you have to sleep on extra pillows at night in order to breathe.   Complete by:  As directed    Call MD for:  difficulty breathing, headache or visual disturbances   Complete by:  As directed    Call MD for:  persistant dizziness or light-headedness   Complete by:  As directed    Call MD for:  persistant nausea and vomiting   Complete by:  As directed    Call MD for:  temperature >100.4   Complete by:  As directed    Diet - low sodium heart healthy   Complete by:  As directed    Discharge instructions   Complete by:  As directed    1)You have Paroxysmal Atrial Fibrillation-- (irregular heartbeat) 2)Take Cardizem CD 180 mg daily (up from 120 mg)  3)You may take additional Cardizem 30 mg every 24 hours as needed for  palpitations/heart rate above 110 bpm 4)Your risk of stroke or mini stroke or blood clots is 4.8 %  to 6.7% each year because of your atrial fibrillation 5)Your risk of significant bleeding if you go on a blood thinner is less than 1 % per year 6)Given your history of previous mini stroke/TIA as discussed you have agreed to start Eliquis/Apixaban 5 mg twice a day to reduce your Stroke risk 7)Avoid ibuprofen/Advil/Aleve/Motrin/Goody Powders/Naproxen/BC powders/Meloxicam/Diclofenac/Indomethacin and  other Nonsteroidal anti-inflammatory medications as these will make you more likely to bleed and can cause stomach ulcers, can also cause Kidney problems.  8)Follow-up with atrial fibrillation clinic around June 11th, 2019, you may also follow-up with the cardiologist Dr. Caryl Comes  for reevaluation   Increase activity slowly   Complete by:  As directed         Discharge Medications     Allergies as of 04/05/2018      Reactions   Peanut-containing Drug Products Swelling      Medication List    STOP taking these medications   aspirin 81 MG EC tablet     TAKE these medications   acetaminophen 325 MG tablet Commonly known as:  TYLENOL Take 2 tablets (650 mg total) by mouth every 4 (four) hours as needed for headache or mild pain.   ALPRAZolam 0.5 MG tablet Commonly known as:  XANAX TAKE 1/2 TO 1 TABLET BY MOUTH TWICE A DAY AS NEEDED   apixaban 5 MG Tabs tablet Commonly known as:  ELIQUIS Take 1 tablet (5 mg total) by mouth 2 (two) times daily.   atorvastatin 40 MG tablet Commonly known as:  LIPITOR TAKE 1 TABLET (40 MG TOTAL) BY MOUTH DAILY.   diltiazem 180 MG 24 hr capsule Commonly known as:  CARDIZEM CD Take 1 capsule (180 mg total) by mouth daily. Start taking on:  04/06/2018 What changed:    medication strength  how much to take   diltiazem 30 MG tablet Commonly known as:  CARDIZEM Take 1 tablet (30 mg total) by mouth daily as needed. For palpitations/heart rate above  110bpm   levothyroxine 112 MCG tablet Commonly known as:  SYNTHROID, LEVOTHROID Take 112 mcg by mouth daily.   potassium chloride 10 MEQ tablet Commonly known as:  K-DUR Take 1 tablet (10 mEq total) by mouth daily.       Major procedures and Radiology Reports - PLEASE review detailed and final reports for all details, in brief -    Dg Chest 2 View  Result Date: 04/04/2018 CLINICAL DATA:  Acute onset of palpitations. Lightheadedness. EXAM: CHEST - 2 VIEW COMPARISON:  CT images of the chest performed 01/02/2017 FINDINGS: The lungs are well-aerated. Mild peribronchial thickening is noted. There is no evidence of focal opacification, pleural effusion or pneumothorax. The heart is normal in size; the mediastinal contour is within normal limits. No acute osseous abnormalities are seen. IMPRESSION: Mild peribronchial thickening noted; lungs otherwise clear. Electronically Signed   By: Garald Balding M.D.   On: 04/04/2018 23:10    Micro Results     Recent Results (from the past 240 hour(s))  MRSA PCR Screening     Status: None   Collection Time: 04/05/18  4:38 AM  Result Value Ref Range Status   MRSA by PCR NEGATIVE NEGATIVE Final    Comment:        The GeneXpert MRSA Assay (FDA approved for NASAL specimens only), is one component of a comprehensive MRSA colonization surveillance program. It is not intended to diagnose MRSA infection nor to guide or monitor treatment for MRSA infections. Performed at [facility], [place] [address] [place], [place] [address]        Today   Subjective    Alexandria [name] today has no complaints, no chest pains no dizziness no palpitations no shortness of breath          Patient has been seen and examined prior to discharge   Objective  Blood pressure 128/71, pulse (!) 56, temperature 98.6 F (37 C), temperature source Oral, resp. rate 14, height 5\' 7"  (1.702 m), weight 83.1 kg (183 lb 3.2 oz), last menstrual period 01/26/2012, SpO2 96  %.   Intake/Output Summary (Last 24 hours) at 04/05/2018 1542 Last data filed at 04/05/2018 1500 Gross per 24 hour  Intake 2089.16 ml  Output 750 ml  Net 1339.16 ml    Exam Gen:- Awake Alert,  In no apparent distress  HEENT:- [place].AT, No sclera icterus Neck-Supple Neck,No JVD,.  Lungs-  CTAB , good air movement CV- S1, S2 normal, irregularly irregular, heart rate around 60 Abd-  +ve B.Sounds, Abd Soft, No tenderness,    Extremity/Skin:- No  edema,   good pulses Psych-affect is appropriate, oriented x3 Neuro-no new focal deficits, no tremors   Data Review   CBC w Diff:  Lab Results  Component Value Date   WBC 8.0 04/04/2018   HGB 14.1 04/04/2018   HCT 40.6 04/04/2018   PLT 292 04/04/2018   LYMPHOPCT 24.9 12/11/2016   MONOPCT 8.0 12/11/2016   EOSPCT 2.1 12/11/2016   BASOPCT 0.6 12/11/2016    CMP:  Lab Results  Component Value Date   NA 139 04/04/2018   K 3.3 (L) 04/04/2018   CL 107 04/04/2018   CO2 24 04/04/2018   BUN 12 04/04/2018   CREATININE 0.76 04/04/2018   PROT 7.0 12/11/2016   ALBUMIN 4.1 12/11/2016   BILITOT 0.3 12/11/2016   ALKPHOS 83 12/11/2016   AST 12 12/11/2016   ALT 12 12/11/2016  .   Total Discharge time is about 33 minutes  Roxan Hockey M.D on 04/05/2018 at 3:42 PM  [facility]   Office  [phone]  Voice Recognition Viviann Spare dictation system was used to create this note, attempts have been made to correct errors. Please contact the author with questions and/or clarifications.

## 2018-04-05 NOTE — Progress Notes (Signed)
ANTICOAGULATION CONSULT NOTE - Initial Consult  Pharmacy Consult for heparin Indication: atrial fibrillation  Allergies  Allergen Reactions  . Peanut-Containing Drug Products Swelling    Patient Measurements: Height: 5\' 7"  (170.2 cm) Weight: 183 lb 3.2 oz (83.1 kg) IBW/kg (Calculated) : 61.6 Heparin Dosing Weight: 78.8  Vital Signs: Temp: 98.4 F (36.9 C) (05/31 0404) Temp Source: Oral (05/31 0404) BP: 119/71 (05/31 0404) Pulse Rate: 77 (05/31 0404)  Labs: Recent Labs    04/04/18 2243 04/04/18 2244  HGB 14.1  --   HCT 40.6  --   PLT 292  --   CREATININE 0.76  --   TROPONINI  --  <0.03    Estimated Creatinine Clearance: 84.9 mL/min (by C-G formula based on SCr of 0.76 mg/dL).   Medical History: Past Medical History:  Diagnosis Date  . Anxiety   . Graves disease   . Hyperlipidemia   . Hypertension   . Stroke [facility])     Medications:  Medications Prior to Admission  Medication Sig Dispense Refill Last Dose  . ALPRAZolam (XANAX) 0.5 MG tablet TAKE 1/2 TO 1 TABLET BY MOUTH TWICE A DAY AS NEEDED 30 tablet o Taking  . aspirin 81 MG EC tablet TAKE 1 TABLET (81 MG TOTAL) BY MOUTH DAILY. 90 tablet 3 Taking  . atorvastatin (LIPITOR) 40 MG tablet TAKE 1 TABLET (40 MG TOTAL) BY MOUTH DAILY. 90 tablet 3 Taking  . diltiazem (CARDIZEM CD) 120 MG 24 hr capsule TAKE 1 CAPSULE (120 MG TOTAL) BY MOUTH DAILY. 30 capsule 6   . levothyroxine (SYNTHROID, LEVOTHROID) 112 MCG tablet Take 112 mcg by mouth daily.  6 Taking    Assessment: 57 yo lady to start heparin for afib.  She received 80 mg lovenox at 00:40. Goal of Therapy:  Heparin level 0.3-0.7 units/ml Monitor platelets by anticoagulation protocol: Yes   Plan:  Start heparin drip 12 hours after lovenox dose at 1100 units/hr Check heparin level 6 hours after start Daily HL and CBC while on heparin Monitor for bleeding complications  Melissa Pulido [place] 04/05/2018,5:27 AM

## 2018-04-05 NOTE — H&P (Signed)
History and Physical    [name] DXI:[id number] DOB: September 23, 1961 DOA: 04/04/2018  Referring MD/NP/PA:   PCP: [name], DO   Patient coming from:  The patient is coming from home.  At baseline, pt is independent for most of ADL.        Chief Complaint: Palpitation  HPI: Alexandria [name] is a 57 y.o. female with medical history significant of hypertension, hyperlipidemia, stroke, hypothyroidism, anxiety, who presents with palpitation.  Patient states that he has been having palpitation chronically.  She has been followed by cardiologist, Dr. Caryl Comes.  She has been using Apple Heart Monitor for about one month. Today, she has palpitations again, and A fib was detected by her monitor.  Patient does not have chest pain, shortness breath.  She reports lightheadedness and dizziness.  No unilateral weakness, numbness or tingling his extremities.  No facial droop, slurred speech.  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  No symptoms of UTI.  She had mild diarrhea 2 days ago, which has resolved.  Currently no nausea, vomiting, diarrhea or abdominal pain.  ED Course: pt was found to have A. fib with RVR on EKG, negative troponin, potassium 3.3, creatinine normal, temperature normal, oxygen saturation 92% on room air, chest x-ray showed mild peribronchial thickening.  Patient is placed on stepdown bed for observation.  Review of Systems:   General: no fevers, chills, no body weight gain, has fatigue HEENT: no blurry vision, hearing changes or sore throat Respiratory: no dyspnea, coughing, wheezing CV: no chest pain, has palpitations GI: no nausea, vomiting, abdominal pain, diarrhea, constipation GU: no dysuria, burning on urination, increased urinary frequency, hematuria  Ext: no leg edema Neuro: no unilateral weakness, numbness, or tingling, no vision change or hearing loss. Has dizziness and lightheadedness. Skin: no rash, no skin tear. MSK: No muscle spasm,  no deformity, no limitation of range of movement in spin Heme: No easy bruising.  Travel history: No recent long distant travel.  Allergy:  Allergies  Allergen Reactions  . Peanut-Containing Drug Products Swelling    Past Medical History:  Diagnosis Date  . Anxiety   . Graves disease   . Hyperlipidemia   . Hypertension   . Stroke [facility])     Past Surgical History:  Procedure Laterality Date  . CARPAL TUNNEL RELEASE      Social History:  reports that she has never smoked. She has never used smokeless tobacco. She reports that she drinks about 0.6 oz of alcohol per week. She reports that she does not use drugs.  Family History:  Family History  Problem Relation Age of Onset  . Coronary artery disease Father   . Atrial fibrillation Father   . Diabetes Maternal Grandmother   . Hyperlipidemia Mother   . Dementia Mother   . Coronary artery disease Paternal Grandfather   . Coronary artery disease Paternal Uncle      Prior to Admission medications   Medication Sig Start Date End Date Taking? Authorizing Provider  ALPRAZolam Duanne Moron) 0.5 MG tablet TAKE 1/2 TO 1 TABLET BY MOUTH TWICE A DAY AS NEEDED 01/10/17   Saguier, Percell Miller, PA-C  aspirin 81 MG EC tablet TAKE 1 TABLET (81 MG TOTAL) BY MOUTH DAILY. 02/05/18   Carollee Herter, Alferd Apa, DO  atorvastatin (LIPITOR) 40 MG tablet TAKE 1 TABLET (40 MG TOTAL) BY MOUTH DAILY. 02/05/18   Carollee Herter, Alferd Apa, DO  diltiazem (CARDIZEM CD) 120 MG 24 hr capsule TAKE 1  CAPSULE (120 MG TOTAL) BY MOUTH DAILY. 02/25/18 05/26/18  Deboraha Sprang, MD  levothyroxine (SYNTHROID, LEVOTHROID) 112 MCG tablet Take 112 mcg by mouth daily. 03/16/17   [provider]    Physical Exam: Vitals:   04/05/18 0035 04/05/18 0100 04/05/18 0244 04/05/18 0404  BP:  119/82  119/71  Pulse:  83 (!) 56 77  Resp:  15 18 16   Temp: 98.3 F (36.8 C)   98.4 F (36.9 C)  TempSrc: Oral   Oral  SpO2:  96% 96% 98%  Weight:    83.1 kg (183 lb 3.2 oz)  Height:    5\' 7"   (1.702 m)   General: Not in acute distress HEENT:       Eyes: PERRL, EOMI, no scleral icterus.       ENT: No discharge from the ears and nose, no pharynx injection, no tonsillar enlargement.        Neck: No JVD, no bruit, no mass felt. Heme: No neck lymph node enlargement. Cardiac: S1/S2, RRR, irregularly irregular rhythm. No gallops or rubs. Respiratory: No rales, wheezing, rhonchi or rubs. GI: Soft, nondistended, nontender, no rebound pain, no organomegaly, BS present. GU: No hematuria Ext: No pitting leg edema bilaterally. 2+DP/PT pulse bilaterally. Musculoskeletal: No joint deformities, No joint redness or warmth, no limitation of ROM in spin. Skin: No rashes.  Neuro: Alert, oriented X3, cranial nerves II-XII grossly intact, moves all extremities normally. Psych: Patient is not psychotic, no suicidal or hemocidal ideation.  Labs on Admission: I have personally reviewed following labs and imaging studies  CBC: Recent Labs  Lab 04/04/18 2243  WBC 8.0  HGB 14.1  HCT 40.6  MCV 88.3  PLT 269   Basic Metabolic Panel: Recent Labs  Lab 04/04/18 2243  NA 139  K 3.3*  CL 107  CO2 24  GLUCOSE 144*  BUN 12  CREATININE 0.76  CALCIUM 8.8*   GFR: Estimated Creatinine Clearance: 84.9 mL/min (by C-G formula based on SCr of 0.76 mg/dL). Liver Function Tests: No results for input(s): AST, ALT, ALKPHOS, BILITOT, PROT, ALBUMIN in the last 168 hours. No results for input(s): LIPASE, AMYLASE in the last 168 hours. No results for input(s): AMMONIA in the last 168 hours. Coagulation Profile: No results for input(s): INR, PROTIME in the last 168 hours. Cardiac Enzymes: Recent Labs  Lab 04/04/18 2244  TROPONINI <0.03   BNP (last 3 results) No results for input(s): PROBNP in the last 8760 hours. HbA1C: No results for input(s): HGBA1C in the last 72 hours. CBG: No results for input(s): GLUCAP in the last 168 hours. Lipid Profile: No results for input(s): CHOL, HDL, LDLCALC,  TRIG, CHOLHDL, LDLDIRECT in the last 72 hours. Thyroid Function Tests: No results for input(s): TSH, T4TOTAL, FREET4, T3FREE, THYROIDAB in the last 72 hours. Anemia Panel: No results for input(s): VITAMINB12, FOLATE, FERRITIN, TIBC, IRON, RETICCTPCT in the last 72 hours. Urine analysis:    Component Value Date/Time   COLORURINE yellow 11/25/2009 0929   APPEARANCEUR Clear 11/25/2009 0929   LABSPEC 1.015 11/25/2009 0929   PHURINE 5.5 11/25/2009 0929   HGBUR negative 11/25/2009 0929   BILIRUBINUR neg 11/27/2011 0944   PROTEINUR trace 11/27/2011 0944   UROBILINOGEN 0.2 11/27/2011 0944   UROBILINOGEN 0.2 11/25/2009 0929   NITRITE neg 11/27/2011 0944   NITRITE negative 11/25/2009 0929   LEUKOCYTESUR Negative 11/27/2011 0944   Sepsis Labs: @LABRCNTIP (procalcitonin:4,lacticidven:4) )No results found for this or any previous visit (from the past 240 hour(s)).   Radiological Exams  on Admission: Dg Chest 2 View  Result Date: 04/04/2018 CLINICAL DATA:  Acute onset of palpitations. Lightheadedness. EXAM: CHEST - 2 VIEW COMPARISON:  CT images of the chest performed 01/02/2017 FINDINGS: The lungs are well-aerated. Mild peribronchial thickening is noted. There is no evidence of focal opacification, pleural effusion or pneumothorax. The heart is normal in size; the mediastinal contour is within normal limits. No acute osseous abnormalities are seen. IMPRESSION: Mild peribronchial thickening noted; lungs otherwise clear. Electronically Signed   By: Garald Balding M.D.   On: 04/04/2018 23:10     EKG: Independently reviewed.  Atrial fibrillation with RVR, QTC 452  Assessment/Plan Principal Problem:   Atrial fibrillation with RVR (HCC) Active Problems:   HLD (hyperlipidemia)   Anxiety   Essential hypertension   Stroke (cerebrum) ([place])   New onset atrial fibrillation with RVR ([place]): no CP.  Pt states that her endocrinologist checked her thyroid function 2 weeks ago, which was normal.  Will not  repeat thyroid function test. CHA2DS2-VASc Score is 4, will needs oral anticoagulation. Pt was given one dose of Lovenox in ED, she dose notwant to continue lovenox due to intolerable pain.  - will place on SUD  for obs - IV heparin per pharm - continue Cardizem drip - ontinue home oral Cardizem - cycle CE q6 x3 and repeat EKG in the am  - Risk factor stratification: will check FLP, UDS and A1C  - 2d echo - inpt card consult was requested via Epic  HLD (hyperlipidemia): -Lipitor  Anxiety: -Continue home PRN Xanax  HTN:  -Continue home medications: Cardizem -IV hydralazine prn  Hx of  Stroke (cerebrum) ([place]): -Continue aspirin, Lipitor -Patient will need oral anticoagulant at discharge   DVT ppx: on IV Heparin Code Status: Full code Family Communication: None at bed side.    Disposition Plan:  Anticipate discharge back to previous home environment Consults called:  none Admission status:   SDU/obs  Date of Service 04/05/2018    Ivor Costa [facility] Pager [phone]  If 7PM-7AM, please contact night-coverage [email or web address] Password [facility] 04/05/2018, 5:16 AM

## 2018-04-05 NOTE — Discharge Instructions (Addendum)
1)You have Paroxysmal Atrial Fibrillation-- (irregular heartbeat) 2)Take Cardizem CD 180 mg daily (up from 120 mg)  3)You may take additional Cardizem 30 mg every 24 hours as needed for palpitations/heart rate above 110 bpm 4)Your risk of stroke or mini stroke or blood clots is 4.8 %  to 6.7% each year because of your atrial fibrillation 5)Your risk of significant bleeding if you go on a blood thinner is less than 1 % per year 6)Given your history of previous mini stroke/TIA as discussed you have agreed to start Eliquis/Apixaban 5 mg twice a day to reduce your Stroke risk 7)Avoid ibuprofen/Advil/Aleve/Motrin/Goody Powders/Naproxen/BC powders/Meloxicam/Diclofenac/Indomethacin and other Nonsteroidal anti-inflammatory medications as these will make you more likely to bleed and can cause stomach ulcers, can also cause Kidney problems.  8)Follow-up with atrial fibrillation clinic around June 11th, 2019, you may also follow-up with the cardiologist Dr. Caryl Comes  for reevaluation   Information on my medicine - ELIQUIS (apixaban)  This medication education was reviewed with me or my healthcare representative as part of my discharge preparation.  The pharmacist that spoke with me during my hospital stay was:  Dareen Piano, [facility]  Why was Eliquis prescribed for you? Eliquis was prescribed for you to reduce the risk of a blood clot forming that can cause a stroke if you have a medical condition called atrial fibrillation (a type of irregular heartbeat).  What do You need to know about Eliquis ? Take your Eliquis TWICE DAILY - one tablet in the morning and one tablet in the evening with or without food. If you have difficulty swallowing the tablet whole please discuss with your pharmacist how to take the medication safely.  Take Eliquis exactly as prescribed by your doctor and DO NOT stop taking Eliquis without talking to the doctor who prescribed the medication.  Stopping may increase your risk of  developing a stroke.  Refill your prescription before you run out.  After discharge, you should have regular check-up appointments with your healthcare provider that is prescribing your Eliquis.  In the future your dose may need to be changed if your kidney function or weight changes by a significant amount or as you get older.  What do you do if you miss a dose? If you miss a dose, take it as soon as you remember on the same day and resume taking twice daily.  Do not take more than one dose of ELIQUIS at the same time to make up a missed dose.  Important Safety Information A possible side effect of Eliquis is bleeding. You should call your healthcare provider right away if you experience any of the following: ? Bleeding from an injury or your nose that does not stop. ? Unusual colored urine (red or dark brown) or unusual colored stools (red or black). ? Unusual bruising for unknown reasons. ? A serious fall or if you hit your head (even if there is no bleeding).  Some medicines may interact with Eliquis and might increase your risk of bleeding or clotting while on Eliquis. To help avoid this, consult your healthcare provider or pharmacist prior to using any new prescription or non-prescription medications, including herbals, vitamins, non-steroidal anti-inflammatory drugs (NSAIDs) and supplements.  This website has more information on Eliquis (apixaban): http://www.eliquis.com/eliquis/home 1)You have Paroxysmal Atrial Fibrillation-- (irregular heartbeat) 2)Take Cardizem CD 180 mg daily (up from 120 mg)  3)You may take additional Cardizem 30 mg every 24 hours as needed for palpitations/heart rate above 110 bpm 4)Your risk of stroke or  mini stroke or blood clots is 4.8 %  to 6.7% each year because of your atrial fibrillation 5)Your risk of significant bleeding if you go on a blood thinner is less than 1 % per year 6)Given your history of previous mini stroke/TIA as discussed you have agreed  to start Eliquis/Apixaban 5 mg twice a day to reduce your Stroke risk 7)Avoid ibuprofen/Advil/Aleve/Motrin/Goody Powders/Naproxen/BC powders/Meloxicam/Diclofenac/Indomethacin and other Nonsteroidal anti-inflammatory medications as these will make you more likely to bleed and can cause stomach ulcers, can also cause Kidney problems.  8)Follow-up with atrial fibrillation clinic around June 11th, 2019, you may also follow-up with the cardiologist Dr. Caryl Comes  for reevaluation

## 2018-04-05 NOTE — Care Management Note (Signed)
Case Management Note  Patient Details  Name: Alexandria [name] MRN: [id number] Date of Birth: 1961/11/04  Subjective/Objective:   Pt admitted with Afib                 Action/Plan:   PTA independent from home alone.  Pt has PCP and denied barriers with obtaining medications.  Pt will discharge home on Eliquis - CM submitted benefit check - no prior auth required and copay without copay card is $30.  CM provided both free 30day card and reduced copay card.  Pt will use [facility] in [place] to fill prescriptions - CM confirmed pharmacy can fill prescription   Expected Discharge Date:  04/05/18               Expected Discharge Plan:  Home/Self Care  In-House Referral:     Discharge planning Services  CM Consult  Post Acute Care Choice:    Choice offered to:     DME Arranged:    DME Agency:     HH Arranged:    [place] Agency:     Status of Service:  Completed, signed off  If discussed at [employer] of Stay Meetings, dates discussed:    Additional Comments:  Maryclare Labrador, RN 04/05/2018, 4:02 PM

## 2018-04-05 NOTE — Plan of Care (Signed)
57 yo F with new onset a.fib RVR.  She thinks she flipped in to it tonight.  Her phone program thinks shes been in it for 13 days.  Will get put on lovenox for now.  On cardizem gtt.  Will put in for SDU.  Did let EDP know SDUs currently holding.

## 2018-04-05 NOTE — Telephone Encounter (Signed)
-----   Message from Charlie Pitter, [place] sent at 04/05/2018  3:38 PM EDT ----- Regarding: needs sleep study Hi Nina, I saw this patient in consult with Dr. Radford Pax for atrial fib, new onset. She also reports snoring and needs a sleep study set up (no history of sleep apnea). Can you arrange? Dr. Radford Pax told me to tell you that you can put it under her name. The patient is traditionally followed by Caryl Comes in our office. She's being discharged today. Thanks, [employer]

## 2018-04-05 NOTE — Consult Note (Addendum)
Cardiology Consultation:   Patient ID: Alexandria [name]; [id number]; May 18, 1961   Admit date: 04/04/2018 Date of Consult: 04/05/2018  Primary Care Provider: Carollee Herter, Alferd Apa, DO Primary Cardiologist: N/A Primary Electrophysiologist:  Caryl Comes  Chief Complaint: palpitations - AliveCor caught atrial fib  Patient Profile:    Alexandria [name] is a 57 y.o. female with a hx of NSVT identified on prior event monitor, anxiety, Graves disease (s/p RAI with resultant hypothyroidism), HTN, HLD, TIA 01/2017, pulmonary nodules/hiatal hernia by imaging 2018 who is being seen today for the evaluation of newly recognized atrial fib at the request of Dr. Blaine Hamper.  History of Present Illness:   In 2018 she established care for NSVT seen on event monitor, done for palpitations associated with chest discomfort. 2D 12/2016 echo showed EF 60-65%, mild LVH. ETT 12/2016 showed hypertensive response to exercise, horizontal ST segment depression ST segment depression was noted during stress in the V4 and V5 leads, beginning at 5 minutes of stress, and returning to baseline after less than 1 minute of recovery, with PVCs - the study was not interpreted as positive or negative. Cardiac CT scoring was undertaken 01/03/18 which showed calcium score of zero, otherwise with a moderate hiatal hernia and right sided pulmonary nodules maximally 82mm (per radiologist, no f/u needed if low risk, otherwise consider CT in 12 months if high-risk). Cardiac MRI was recommended but not performed as the patient was concerned about missing time from work. In 01/2017 she evaluated for speech difficulty in [facility] and was felt to have had a TIA. Brain imaging was unremarkable. She was started on ASA 325mg . She underwent a transcranial doppler bubble study that was normal. She was seen by cardiology NP who recommended consideration of ILD but deferred to neurology for final input. More recently she was seen by Dr. Caryl Comes 02/22/18 for recurrent  palpitations who recommended AliveCor given their infrequent nature. He again felt cMRI might be useful but do not see this scheduled. She saw her endocrinologist Dr. Chalmers Cater 2 weeks ago and states she was told thyroid function looked perfect.  Her last episode of palpitations were about a month ago. They vary in duration. She felt onset of lightheadedness and heart pounding last night and AliveCor indicated irregular heartbeat, prompting her to come to the ER. She was found to be in atrial fib with HR around 100. This was much more dramatic than the prior palpitations she'd experienced. She was given IV Lovenox and IV diltazem. She converted to NSR around 10:30am. She actually felt relieve of palpitations with rate control and could not tell she was in atrial fib; she thought she'd converted earlier than that. She does snore heavily and her boyfriend of 5 years has previously suggested she get tested for OSA. Troponins are negative x 3. BNP 120. CBC wnl. BMET K 3.3, glucose 144, no A1C or TSH on file this admission but TSH in 02/2018 was 1.35. 2D Echo today showed EF 60-65%, no RWMA, mild MR, mild LAE, some shadowing artifact in LA on subcostal images. She was resumed on home oral diltiazem dose. He was also treated with IV fluids. She is currently asymptomatic. She does not exercise but with ADLs and stairs she has not had any recent functional limitation or chest pain. She feels fine when she is in NSR.    Past Medical History:  Diagnosis Date  . Anxiety   . Graves disease   . Hiatal hernia    a. seen on CT  12/2016.  Marland Kitchen Hyperlipidemia   . Hypertension   . NSVT (nonsustained ventricular tachycardia) ([place])   . Pulmonary nodules    a. seen on CT 12/2016.  . Stroke ([place])   . TIA (transient ischemic attack) 01/2017   a. tx at [facility].    Past Surgical History:  Procedure Laterality Date  . CARPAL TUNNEL RELEASE       Inpatient Medications: Scheduled Meds: . apixaban  5 mg Oral BID  .  atorvastatin  40 mg Oral Daily  . diltiazem  120 mg Oral Daily  . levothyroxine  112 mcg Oral QAC breakfast   Continuous Infusions: . sodium chloride 75 mL/hr at 04/05/18 0550   PRN Meds: acetaminophen, ALPRAZolam, hydrALAZINE, ondansetron (ZOFRAN) IV, zolpidem  Home Meds: Prior to Admission medications   Medication Sig Start Date End Date Taking? Authorizing Provider  ALPRAZolam Duanne Moron) 0.5 MG tablet TAKE 1/2 TO 1 TABLET BY MOUTH TWICE A DAY AS NEEDED 01/10/17   Saguier, Percell Miller, PA-C  aspirin 81 MG EC tablet TAKE 1 TABLET (81 MG TOTAL) BY MOUTH DAILY. 02/05/18   Carollee Herter, Alferd Apa, DO  atorvastatin (LIPITOR) 40 MG tablet TAKE 1 TABLET (40 MG TOTAL) BY MOUTH DAILY. 02/05/18   Carollee Herter, Alferd Apa, DO  diltiazem (CARDIZEM CD) 120 MG 24 hr capsule TAKE 1 CAPSULE (120 MG TOTAL) BY MOUTH DAILY. 02/25/18 05/26/18  Deboraha Sprang, MD  levothyroxine (SYNTHROID, LEVOTHROID) 112 MCG tablet Take 112 mcg by mouth daily. 03/16/17   [provider]    Allergies:    Allergies  Allergen Reactions  . Peanut-Containing Drug Products Swelling    Social History:   Social History   Socioeconomic History  . Marital status: Divorced    Spouse name: Not on file  . Number of children: Not on file  . Years of education: Not on file  . Highest education level: Not on file  Occupational History  . Not on file  Social Needs  . Financial resource strain: Not on file  . Food insecurity:    Worry: Not on file    Inability: Not on file  . Transportation needs:    Medical: Not on file    Non-medical: Not on file  Tobacco Use  . Smoking status: Never Smoker  . Smokeless tobacco: Never Used  Substance and Sexual Activity  . Alcohol use: Yes    Alcohol/week: 0.6 oz    Types: 1 Glasses of wine per week    Comment: occasionally  . Drug use: No  . Sexual activity: Yes  Lifestyle  . Physical activity:    Days per week: Not on file    Minutes per session: Not on file  . Stress: Not on file    Relationships  . Social connections:    Talks on phone: Not on file    Gets together: Not on file    Attends religious service: Not on file    Active member of club or organization: Not on file    Attends meetings of clubs or organizations: Not on file    Relationship status: Not on file  . Intimate partner violence:    Fear of current or ex partner: Not on file    Emotionally abused: Not on file    Physically abused: Not on file    Forced sexual activity: Not on file  Other Topics Concern  . Not on file  Social History Narrative  . Not on file    Family History:  The patient's family history includes Atrial fibrillation in her father; Coronary artery disease in her father, paternal grandfather, and paternal uncle; Dementia in her mother; Diabetes in her maternal grandmother; Hyperlipidemia in her mother.  ROS:  Please see the history of present illness.  All other ROS reviewed and negative.     Physical Exam/Data:   Vitals:   04/05/18 0244 04/05/18 0404 04/05/18 0757 04/05/18 1122  BP:  119/71 115/68 113/74  Pulse: (!) 56 77  61  Resp: 18 16 17 14   Temp:  98.4 F (36.9 C) 98.5 F (36.9 C) 98.4 F (36.9 C)  TempSrc:  Oral Oral Oral  SpO2: 96% 98% 97% 100%  Weight:  183 lb 3.2 oz (83.1 kg)    Height:  5\' 7"  (1.702 m)      Intake/Output Summary (Last 24 hours) at 04/05/2018 1455 Last data filed at 04/05/2018 1300 Gross per 24 hour  Intake 2089.16 ml  Output 750 ml  Net 1339.16 ml   Filed Weights   04/04/18 2225 04/05/18 0404  Weight: 180 lb (81.6 kg) 183 lb 3.2 oz (83.1 kg)   Body mass index is 28.69 kg/m.  General: Well developed, well nourished WF, in no acute distress. Head: Normocephalic, atraumatic, sclera non-icteric, no xanthomas, nares are without discharge. Neck: Negative for carotid bruits. JVD not elevated. Lungs: Clear bilaterally to auscultation without wheezes, rales, or rhonchi. Breathing is unlabored. Heart: RRR with S1 S2. No murmurs, rubs,  or gallops appreciated. Abdomen: Soft, non-tender, non-distended with normoactive bowel sounds. No hepatomegaly. No rebound/guarding. No obvious abdominal masses. Msk:  Strength and tone appear normal for age. Extremities: No clubbing or cyanosis. No edema.  Distal pedal pulses are 2+ and equal bilaterally. Neuro: Alert and oriented X 3. No facial asymmetry. No focal deficit. Moves all extremities spontaneously. Psych:  Responds to questions appropriately with a normal affect.  EKG:  The EKG was personally reviewed and demonstrates atrial fib 97bpm, QTc 4104ms, nonspecific ST-T changes. Morphology of EKG appears similar to prior.  Relevant CV Studies: 2d echo today Study Conclusions  - Left ventricle: The cavity size was normal. Systolic function was   normal. The estimated ejection fraction was in the range of 60%   to 65%. Wall motion was normal; there were no regional wall   motion abnormalities. Left ventricular diastolic function   parameters were normal. - Mitral valve: There was mild regurgitation. - Left atrium: The atrium was mildly dilated. - Atrial septum: No defect or patent foramen ovale was identified. - Impressions: Appears to be some shadowing artifact in LA on   subcostal images.  Impressions:  - Appears to be some shadowing artifact in LA on subcostal images.  Laboratory Data:  Chemistry Recent Labs  Lab 04/04/18 2243  NA 139  K 3.3*  CL 107  CO2 24  GLUCOSE 144*  BUN 12  CREATININE 0.76  CALCIUM 8.8*  GFRNONAA >60  GFRAA >60  ANIONGAP 8    No results for input(s): PROT, ALBUMIN, AST, ALT, ALKPHOS, BILITOT in the last 168 hours. Hematology Recent Labs  Lab 04/04/18 2243  WBC 8.0  RBC 4.60  HGB 14.1  HCT 40.6  MCV 88.3  MCH 30.7  MCHC 34.7  RDW 13.8  PLT 292   Cardiac Enzymes Recent Labs  Lab 04/04/18 2244 04/05/18 0601 04/05/18 1136  TROPONINI <0.03 <0.03 <0.03   No results for input(s): TROPIPOC in the last 168 hours.   BNP Recent Labs  Lab 04/05/18 0601  BNP  120.4*    DDimer No results for input(s): DDIMER in the last 168 hours.  Radiology/Studies:  Dg Chest 2 View  Result Date: 04/04/2018 CLINICAL DATA:  Acute onset of palpitations. Lightheadedness. EXAM: CHEST - 2 VIEW COMPARISON:  CT images of the chest performed 01/02/2017 FINDINGS: The lungs are well-aerated. Mild peribronchial thickening is noted. There is no evidence of focal opacification, pleural effusion or pneumothorax. The heart is normal in size; the mediastinal contour is within normal limits. No acute osseous abnormalities are seen. IMPRESSION: Mild peribronchial thickening noted; lungs otherwise clear. Electronically Signed   By: Garald Balding M.D.   On: 04/04/2018 23:10    Assessment and Plan:   1.Newly identified atrial fib - CHADSVASC score is 4 for HTN, TIA, female (also with glucose of 144 but no known hx of DM). She has h/o palpitations. The ones she had remotely were associated with CP and found to have NSVT. She has not had those recently. The recent palpitations have been more of a heart pounding associated with lighheadedness. She spontaneously converted to NSR this AM, but actually was not really aware of her atrial fib when she was rate controlled prior to conversion. Discussed anticoag options with patient. She's chosen Eliquis. Have spoken to pharmacy for education and placed order for Eliquis 5mg  BID. I wrote a care management consult to identify any med assistance needs and to give 30 day free card. Recommend arrangement of sleep study as outpatient. Will discuss arrhythmia control options with MD. I would favor a trial of diltiazem CD 180mg  daily with PRN diltiazem for breakthrough palpitations, and f/u in the afib clinic. Her palpitations are fairly infrequent, so can try this before committing to antiarrhythmic therapy.  2. Hypokalemia - not really clear why this has shown up in prior labs. Not really on any meds than can  cause this. Will add on Mg level. Would recommend starting KCl 63meq daily. Cardiology team can obtain BMET/Mg in followup.  3. Prior history of NSVT - quiescent on telemetry. Keep lytes wnl. Patient previously deferred cMRI. LVEF normal.  4. Essential HTN - generally controlled, follow BP.  5. Hyperlipidemia - continue statin. Needs lipids as OP if not recently obtained by endocrinology or PCP, can review as OP.  6. Hyperglycemia - patient is likely being discharged today and does not wish to be stuck again. Can consider A1C as OP.   For questions or updates, please contact [place] Please consult [email or web address] for contact info under Cardiology/STEMI.    Signed, Charlie Pitter, PA-C  04/05/2018 2:55 PM

## 2018-04-05 NOTE — Progress Notes (Signed)
Pt discharged per orders. PIV removed. Discharge instructions provided verbal understanding given. Copies of paperwork and scripts provided and detail of what to take and when. Pt provided with first dose of potassium and eliquis. VVS at discharge. CMT notified. Pt wheeled out to private vehicle.

## 2018-04-05 NOTE — Progress Notes (Addendum)
I have sent a message to our office's scheduling team requesting sleep study arrangement, and our office will call the patient with this information. Also made f/u afib clinic, appt placed on chart. Regarding prior incidental finding of pulm nodules on CT in 2018, we will defer to internal medicine regarding further monitoring as outpatient. I d/w patient to f/u PCP for this.  Dayna Dunn PA-C

## 2018-04-10 ENCOUNTER — Telehealth: Payer: Self-pay | Admitting: *Deleted

## 2018-04-10 NOTE — Telephone Encounter (Signed)
-----   Message from Freada Bergeron, [place] sent at 04/05/2018  4:08 PM EDT ----- Regarding: precert   ----- Message ----- From: Charlie Pitter, PA-C Sent: 04/05/2018   3:38 PM To: Freada Bergeron, CMA Subject: needs sleep study                              Ola Spurr, I saw this patient in consult with Dr. Radford Pax for atrial fib, new onset. She also reports snoring and needs a sleep study set up (no history of sleep apnea). Can you arrange? Dr. Radford Pax told me to tell you that you can put it under her name. The patient is traditionally followed by Caryl Comes in our office. She's being discharged today. Thanks, [employer]

## 2018-04-10 NOTE — Telephone Encounter (Signed)
Staff message sent to [place] per [employer] no PA required. Ok to schedule in lab sleep study.

## 2018-04-11 ENCOUNTER — Telehealth: Payer: Self-pay | Admitting: *Deleted

## 2018-04-11 NOTE — Telephone Encounter (Signed)
-----   Message from Lauralee Evener, [place] sent at 04/10/2018 12:11 PM EDT ----- Regarding: RE: precert Per [employer] no PA required. Ok to schedule sleep study. ----- Message ----- From: Freada Bergeron, CMA Sent: 04/05/2018   4:08 PM To: [facility] Subject: precert                                          ----- Message ----- From: Felipa Evener Sent: 04/05/2018   3:38 PM To: Freada Bergeron, CMA Subject: needs sleep study                              Ola Spurr, I saw this patient in consult with Dr. Radford Pax for atrial fib, new onset. She also reports snoring and needs a sleep study set up (no history of sleep apnea). Can you arrange? Dr. Radford Pax told me to tell you that you can put it under her name. The patient is traditionally followed by Caryl Comes in our office. She's being discharged today. Thanks, [employer]

## 2018-04-11 NOTE — Telephone Encounter (Signed)
Patient is scheduled for lab study on 05/04/18. Patient understands her sleep study will be done at [facility] sleep lab. Patient understands she will receive a sleep packet in a week or so. Patient understands to call if she does not receive the sleep packet in a timely manner. Patient agrees with treatment and thanked me for call

## 2018-04-16 ENCOUNTER — Ambulatory Visit (HOSPITAL_COMMUNITY)
Admit: 2018-04-16 | Discharge: 2018-04-16 | Disposition: A | Discharge status: Home / Self Care | Payer: BC Managed Care – PPO | Attending: Nurse Practitioner | Admitting: Nurse Practitioner

## 2018-04-16 ENCOUNTER — Encounter (HOSPITAL_COMMUNITY): Payer: Self-pay | Admitting: Nurse Practitioner

## 2018-04-16 VITALS — BP 104/62 | HR 63 | Ht 67.0 in | Wt 185.0 lb

## 2018-04-16 DIAGNOSIS — F419 Anxiety disorder, unspecified: Secondary | ICD-10-CM | POA: Diagnosis not present

## 2018-04-16 DIAGNOSIS — K449 Diaphragmatic hernia without obstruction or gangrene: Secondary | ICD-10-CM | POA: Insufficient documentation

## 2018-04-16 DIAGNOSIS — R918 Other nonspecific abnormal finding of lung field: Secondary | ICD-10-CM | POA: Insufficient documentation

## 2018-04-16 DIAGNOSIS — I10 Essential (primary) hypertension: Secondary | ICD-10-CM | POA: Diagnosis not present

## 2018-04-16 DIAGNOSIS — Z79899 Other long term (current) drug therapy: Secondary | ICD-10-CM | POA: Diagnosis not present

## 2018-04-16 DIAGNOSIS — I480 Paroxysmal atrial fibrillation: Secondary | ICD-10-CM

## 2018-04-16 DIAGNOSIS — I4729 Other ventricular tachycardia: Secondary | ICD-10-CM

## 2018-04-16 DIAGNOSIS — E0500 Thyrotoxicosis with diffuse goiter without thyrotoxic crisis or storm: Secondary | ICD-10-CM | POA: Diagnosis not present

## 2018-04-16 DIAGNOSIS — E785 Hyperlipidemia, unspecified: Secondary | ICD-10-CM | POA: Diagnosis not present

## 2018-04-16 DIAGNOSIS — Z8673 Personal history of transient ischemic attack (TIA), and cerebral infarction without residual deficits: Secondary | ICD-10-CM | POA: Diagnosis not present

## 2018-04-16 DIAGNOSIS — Z79890 Hormone replacement therapy (postmenopausal): Secondary | ICD-10-CM | POA: Insufficient documentation

## 2018-04-16 DIAGNOSIS — R0683 Snoring: Secondary | ICD-10-CM

## 2018-04-16 DIAGNOSIS — I472 Ventricular tachycardia: Secondary | ICD-10-CM

## 2018-04-16 DIAGNOSIS — Z7901 Long term (current) use of anticoagulants: Secondary | ICD-10-CM | POA: Insufficient documentation

## 2018-04-16 MED ORDER — DILTIAZEM HCL ER COATED BEADS 240 MG PO CP24: 240.0000 mg | ORAL_CAPSULE | Freq: Every day | ORAL | 3 refills | Status: DC

## 2018-04-16 NOTE — Patient Instructions (Signed)
Stop cardizem 180mg    Start Cardizem 240mg  once a day  Scheduling will be in touch with you to schedule stress testing.  Follow up with Dr. Caryl Comes as scheduled.

## 2018-04-17 NOTE — Progress Notes (Signed)
Primary Care Physician: Carollee Herter, Alferd Apa, DO Primary Electrophysiologist: Caryl Comes Referring Physician: Dominga Mcduffie is a 57 y.o. female with a history of paroxysmal atrial fibrillation who presents for consultation in the [facility].  The patient was initially diagnosed with atrial fibrillation 04/04/18 after palpitations were identified as AF on Kardia app.  She had been having palpitations and in the past had NSVT so obtained Kardia to determine if recurrent palpitations were NSVT.  She went to the hospital for evaluation and converted to SR on Diltiazem.  She was discharged with planned follow up today.  Past medical history includes prior stroke.  She works as a Pharmacist, hospital at [employer]. Her son is home for the summer from college.  She had another episode of AF that started last night and ended around lunch time today.  She took extra 30mg  Diltiazem to get the episode to terminate.  With AF, she has shortness of breath and palpitations.   Today, she denies symptoms of chest pain, orthopnea, PND, lower extremity edema, dizziness, presyncope, syncope, snoring, daytime somnolence, bleeding, or neurologic sequela. The patient is tolerating medications without difficulties and is otherwise without complaint today.    Atrial Fibrillation Risk Factors:  she does not have symptoms or diagnosis of sleep apnea.  she does not have a history of rheumatic fever.  she does have a history of alcohol use - drinks rarely   she has a BMI of Body mass index is 28.98 kg/m.Marland Kitchen Filed Weights   04/16/18 1552  Weight: 185 lb (83.9 kg)    LA size: 43   Atrial Fibrillation Management history:  Previous antiarrhythmic drugs: none  Previous cardioversions: none  Previous ablations: none  CHADS2VASC score: 4  Anticoagulation history: Eliquis   Past Medical History:  Diagnosis Date  . Anxiety   . Graves disease   . Hiatal hernia    a. seen  on CT 12/2016.  Marland Kitchen Hyperlipidemia   . Hypertension   . NSVT (nonsustained ventricular tachycardia) ([place])   . Pulmonary nodules    a. seen on CT 12/2016.  . Stroke ([place])   . TIA (transient ischemic attack) 01/2017   a. tx at [facility].   Past Surgical History:  Procedure Laterality Date  . CARPAL TUNNEL RELEASE      Current Outpatient Medications  Medication Sig Dispense Refill  . ALPRAZolam (XANAX) 0.5 MG tablet TAKE 1/2 TO 1 TABLET BY MOUTH TWICE A DAY AS NEEDED 30 tablet o  . apixaban (ELIQUIS) 5 MG TABS tablet Take 1 tablet (5 mg total) by mouth 2 (two) times daily. 60 tablet 5  . atorvastatin (LIPITOR) 40 MG tablet TAKE 1 TABLET (40 MG TOTAL) BY MOUTH DAILY. 90 tablet 3  . diltiazem (CARDIZEM CD) 240 MG 24 hr capsule Take 1 capsule (240 mg total) by mouth daily. 30 capsule 3  . diltiazem (CARDIZEM) 30 MG tablet Take 1 tablet (30 mg total) by mouth daily as needed. For palpitations/heart rate above 110bpm 20 tablet 1  . levothyroxine (SYNTHROID, LEVOTHROID) 112 MCG tablet Take 112 mcg by mouth daily.  6  . acetaminophen (TYLENOL) 325 MG tablet Take 2 tablets (650 mg total) by mouth every 4 (four) hours as needed for headache or mild pain. 30 tablet 0   No current facility-administered medications for this encounter.     Allergies  Allergen Reactions  . Peanut-Containing Drug Products Swelling    Social History  Socioeconomic History  . Marital status: Divorced    Spouse name: Not on file  . Number of children: Not on file  . Years of education: Not on file  . Highest education level: Not on file  Occupational History  . Not on file  Social Needs  . Financial resource strain: Not on file  . Food insecurity:    Worry: Not on file    Inability: Not on file  . Transportation needs:    Medical: Not on file    Non-medical: Not on file  Tobacco Use  . Smoking status: Never Smoker  . Smokeless tobacco: Never Used  Substance and Sexual Activity  . Alcohol use: Yes     Alcohol/week: 0.6 oz    Types: 1 Glasses of wine per week    Comment: occasionally  . Drug use: No  . Sexual activity: Yes  Lifestyle  . Physical activity:    Days per week: Not on file    Minutes per session: Not on file  . Stress: Not on file  Relationships  . Social connections:    Talks on phone: Not on file    Gets together: Not on file    Attends religious service: Not on file    Active member of club or organization: Not on file    Attends meetings of clubs or organizations: Not on file    Relationship status: Not on file  . Intimate partner violence:    Fear of current or ex partner: Not on file    Emotionally abused: Not on file    Physically abused: Not on file    Forced sexual activity: Not on file  Other Topics Concern  . Not on file  Social History Narrative  . Not on file    Family History  Problem Relation Age of Onset  . Coronary artery disease Father   . Atrial fibrillation Father   . Diabetes Maternal Grandmother   . Hyperlipidemia Mother   . Dementia Mother   . Coronary artery disease Paternal Grandfather   . Coronary artery disease Paternal Uncle    The patient does have a history of early familial atrial fibrillation or other arrhythmias.  ROS- All systems are reviewed and negative except as per the HPI above.  Physical Exam: Vitals:   04/16/18 1552  BP: 104/62  Pulse: 63  Weight: 185 lb (83.9 kg)  Height: 5\' 7"  (1.702 m)    GEN- The patient is well appearing, alert and oriented x 3 today.   Head- normocephalic, atraumatic Eyes-  Sclera clear, conjunctiva pink Ears- hearing intact Oropharynx- clear Neck- supple  Lungs- Clear to ausculation bilaterally, normal work of breathing Heart- Regular rate and rhythm  GI- soft, NT, ND, + BS Extremities- no clubbing, cyanosis, or edema MS- no significant deformity or atrophy Skin- no rash or lesion Psych- euthymic mood, full affect Neuro- strength and sensation are intact  Wt Readings from  Last 3 Encounters:  04/16/18 185 lb (83.9 kg)  04/05/18 183 lb 3.2 oz (83.1 kg)  02/22/18 186 lb (84.4 kg)    EKG today demonstrates sinus rhythm, rate 63, normal intervals Echo 03/2018 demonstrated normal EF, no RWMA, LA 43  Epic records are reviewed at length today  Assessment and Plan:  1. Paroxysmal atrial fibrillation The patient has symptomatic paroxysmal atrial fibrillation.  Continue Eliquis for CHADS2VASC of 5 Pathophysiology, triggers, lifestyle modification reviewed today.  Will increase Diltiazem today Plan for exercise myoview If no CAD and recurrent  AF, would start Flecainide 50mg  twice daily. I would have a very low threshold to offer referral to Dr Rayann Heman for ablation if she fails AAD therapy. We discussed briefly today.   2.  Sleep disordered breathing Sleep study scheduled  3.  NSVT Normal LVEF No recurrence No syncope    Follow up with Dr Caryl Comes as scheduled   Chanetta Marshall, NP 04/17/2018 7:18 AM

## 2018-04-18 ENCOUNTER — Telehealth (HOSPITAL_COMMUNITY): Payer: Self-pay

## 2018-04-18 NOTE — Telephone Encounter (Signed)
Encounter complete. 

## 2018-04-19 ENCOUNTER — Telehealth: Payer: Self-pay

## 2018-04-19 NOTE — Telephone Encounter (Signed)
Per Dr. Carollee Herter, patient needs hospital follow up. Called patient left message for return call.

## 2018-04-19 NOTE — Telephone Encounter (Signed)
04/19/18   Transition Care Management Follow-up Telephone Call  ADMISSION DATE: 04/04/18  DISCHARGE DATE: 04/05/18   How have you been since you were released from the hospital?  Patient states she had another A-Fib episode that lasted 18 hours.    Do you understand why you were in the hospital? Yes   Do you understand the discharge instrcutions? Yes    Items Reviewed:  Medications reviewed: Yes   Allergies reviewed: NKDA   Dietary changes reviewed: Low salt Heart healthy   Referrals reviewed: Appointment for stress test upcoming, Seen by A-fib clinic, Stress test has been ordered.   Functional Questionnaire:   Activities of Daily Living (ADLs): Patient states she can perform all independently.  Any patient concerns? Would like to hav labwork done on day of appointment and Diabetic evaluation.   Confirmed importance and date/time of follow-up visits scheduled: Yes   Confirmed with patient if condition begins to worsen call PCP or go to the ER. Yes    Patient was given the office number and encouragred to call back with questions or concerns. Yes

## 2018-04-22 ENCOUNTER — Encounter: Payer: Self-pay | Admitting: Medical

## 2018-04-22 ENCOUNTER — Ambulatory Visit: Payer: BC Managed Care – PPO | Admitting: Medical

## 2018-04-22 VITALS — BP 116/70 | HR 51 | Temp 98.0°F | Resp 16 | Ht 67.0 in | Wt 185.8 lb

## 2018-04-22 DIAGNOSIS — E039 Hypothyroidism, unspecified: Secondary | ICD-10-CM | POA: Diagnosis not present

## 2018-04-22 DIAGNOSIS — E785 Hyperlipidemia, unspecified: Secondary | ICD-10-CM | POA: Diagnosis not present

## 2018-04-22 DIAGNOSIS — R739 Hyperglycemia, unspecified: Secondary | ICD-10-CM

## 2018-04-22 DIAGNOSIS — I480 Paroxysmal atrial fibrillation: Secondary | ICD-10-CM | POA: Diagnosis not present

## 2018-04-22 DIAGNOSIS — E876 Hypokalemia: Secondary | ICD-10-CM

## 2018-04-22 DIAGNOSIS — Z1211 Encounter for screening for malignant neoplasm of colon: Secondary | ICD-10-CM | POA: Diagnosis not present

## 2018-04-22 LAB — TSH: TSH: 0.65 u[IU]/mL (ref 0.35–4.50)

## 2018-04-22 LAB — COMPREHENSIVE METABOLIC PANEL
ALBUMIN: 4 g/dL (ref 3.5–5.2)
ALK PHOS: 109 U/L (ref 39–117)
ALT: 17 U/L (ref 0–35)
AST: 12 U/L (ref 0–37)
BUN: 19 mg/dL (ref 6–23)
CO2: 29 mEq/L (ref 19–32)
CREATININE: 0.8 mg/dL (ref 0.40–1.20)
Calcium: 9.3 mg/dL (ref 8.4–10.5)
Chloride: 104 mEq/L (ref 96–112)
GFR: 78.03 mL/min (ref >60.00)
GLUCOSE: 103 mg/dL — AB (ref 70–99)
Potassium: 4.3 mEq/L (ref 3.5–5.1)
SODIUM: 140 meq/L (ref 135–145)
TOTAL PROTEIN: 6.7 g/dL (ref 6.0–8.3)
Total Bilirubin: 0.6 mg/dL (ref 0.2–1.2)

## 2018-04-22 LAB — HEMOGLOBIN A1C: HEMOGLOBIN A1C: 6.1 % (ref 4.6–6.5)

## 2018-04-22 NOTE — Patient Instructions (Addendum)
You are presently in normal sinus rhythm on exam.  No atrial fibrillation heard.  Would continue with the cardiologist recommendations/continuing the Cardizem.  If you have any recurrent atrial fibrillation events then notify the atrial fibrillation clinic.  Hopefully on titrating up on the dose as they have done you will not have any further events.  For further events consider ablation procedure.  Discuss further with specialist.  Today we will get CMP, CBC, TSH and A1c.  Follow-up date to be determined after lab review.

## 2018-04-22 NOTE — Progress Notes (Signed)
Subjective:    Patient ID: Alexandria [name], female    DOB: [birth year], 59 y.o.   MRN: [id number]  HPI  Pt in for follow up.  Pt is in for hospitalization. She was in for atrial fibrillation.  Pt has seen Dr. Caryl Comes cardiologist in past. She has history of paroxysmal a fibrillation. Pt had cardiac monitor which she caught on her cell phone. She was admitted on 04-04-2018. Then discharged next day.  Atrial fib clinical summary assessment. On 04-16-2018 she had transient atrial fibrillation. But not present next day when she saw atrial fibrillation clinic.  Assessment and Plan  1. Paroxysmal atrial fibrillation The patient has symptomatic paroxysmal atrial fibrillation.  Continue Eliquis for CHADS2VASC of 5 Pathophysiology, triggers, lifestyle modification reviewed today.  Will increase Diltiazem today Plan for exercise myoview If no CAD and recurrent AF, would start Flecainide 50mg  twice daily. I would have a very low threshold to offer referral to Dr Rayann Heman for ablation if she fails AAD therapy. We discussed briefly today.   Pt has option to add caridezem 30 mg if need for tacyhcardia/palpitation above 110.  3.  NSVT Normal LVEF No recurrence No syncope     Review of Systems  Constitutional: Negative for chills, fatigue and fever.  Respiratory: Negative for cough, chest tightness, shortness of breath and wheezing.   Cardiovascular: Negative for chest pain and palpitations.  Gastrointestinal: Negative for abdominal pain.  Musculoskeletal: Negative for back pain, joint swelling, myalgias and neck stiffness.  Skin: Negative for rash.  Neurological: Negative for dizziness, weakness, numbness and headaches.  Hematological: Negative for adenopathy. Does not bruise/bleed easily.  Psychiatric/Behavioral: Negative for behavioral problems, confusion, sleep disturbance and suicidal ideas. The patient is not nervous/anxious.     Past Medical History:  Diagnosis Date  . Anxiety   . Graves  disease   . Hiatal hernia    a. seen on CT 12/2016.  Marland Kitchen Hyperlipidemia   . Hypertension   . NSVT (nonsustained ventricular tachycardia) ([place])   . Pulmonary nodules    a. seen on CT 12/2016.  . Stroke ([place])   . TIA (transient ischemic attack) 01/2017   a. tx at [facility].     Social History   Socioeconomic History  . Marital status: Divorced    Spouse name: Not on file  . Number of children: Not on file  . Years of education: Not on file  . Highest education level: Not on file  Occupational History  . Not on file  Social Needs  . Financial resource strain: Not on file  . Food insecurity:    Worry: Not on file    Inability: Not on file  . Transportation needs:    Medical: Not on file    Non-medical: Not on file  Tobacco Use  . Smoking status: Never Smoker  . Smokeless tobacco: Never Used  Substance and Sexual Activity  . Alcohol use: Yes    Alcohol/week: 0.6 oz    Types: 1 Glasses of wine per week    Comment: occasionally  . Drug use: No  . Sexual activity: Yes  Lifestyle  . Physical activity:    Days per week: Not on file    Minutes per session: Not on file  . Stress: Not on file  Relationships  . Social connections:    Talks on phone: Not on file    Gets together: Not on file    Attends religious service: Not on file    Active member  of club or organization: Not on file    Attends meetings of clubs or organizations: Not on file    Relationship status: Not on file  . Intimate partner violence:    Fear of current or ex partner: Not on file    Emotionally abused: Not on file    Physically abused: Not on file    Forced sexual activity: Not on file  Other Topics Concern  . Not on file  Social History Narrative  . Not on file    Past Surgical History:  Procedure Laterality Date  . CARPAL TUNNEL RELEASE      Family History  Problem Relation Age of Onset  . Coronary artery disease Father   . Atrial fibrillation Father   . Diabetes Maternal Grandmother    . Hyperlipidemia Mother   . Dementia Mother   . Coronary artery disease Paternal Grandfather   . Coronary artery disease Paternal Uncle     Allergies  Allergen Reactions  . Peanut-Containing Drug Products Swelling    Current Outpatient Medications on File Prior to Visit  Medication Sig Dispense Refill  . acetaminophen (TYLENOL) 325 MG tablet Take 2 tablets (650 mg total) by mouth every 4 (four) hours as needed for headache or mild pain. 30 tablet 0  . ALPRAZolam (XANAX) 0.5 MG tablet TAKE 1/2 TO 1 TABLET BY MOUTH TWICE A DAY AS NEEDED 30 tablet o  . apixaban (ELIQUIS) 5 MG TABS tablet Take 1 tablet (5 mg total) by mouth 2 (two) times daily. 60 tablet 5  . atorvastatin (LIPITOR) 40 MG tablet TAKE 1 TABLET (40 MG TOTAL) BY MOUTH DAILY. 90 tablet 3  . diltiazem (CARDIZEM CD) 240 MG 24 hr capsule Take 1 capsule (240 mg total) by mouth daily. 30 capsule 3  . diltiazem (CARDIZEM) 30 MG tablet Take 1 tablet (30 mg total) by mouth daily as needed. For palpitations/heart rate above 110bpm 20 tablet 1  . levothyroxine (SYNTHROID, LEVOTHROID) 112 MCG tablet Take 112 mcg by mouth daily.  6   No current facility-administered medications on file prior to visit.     BP 116/70   Pulse (!) 51   Temp 98 F (36.7 C) (Oral)   Resp 16   Ht 5\' 7"  (1.702 m)   Wt 185 lb 12.8 oz (84.3 kg)   LMP 01/26/2012   SpO2 99%   BMI 29.10 kg/m       Objective:   Physical Exam   General Mental Status- Alert. General Appearance- Not in acute distress.   Skin General: Color- Normal Color. Moisture- Normal Moisture.  Neck Carotid Arteries- Normal color. Moisture- Normal Moisture. No carotid bruits. No JVD.  Chest and Lung Exam Auscultation: Breath Sounds:-Normal.  Cardiovascular Auscultation:Rythm- Regular. Murmurs & Other Heart Sounds:Auscultation of the heart reveals- No Murmurs.   Abdomen Inspection:-Inspeection Normal. Palpation/Percussion:Note:No mass. Palpation and Percussion of the  abdomen reveal- Non Tender, Non Distended + BS, no rebound or guarding.   Neurologic Cranial Nerve exam:- CN III-XII intact(No nystagmus), symmetric smile. Strength:- 5/5 equal and symmetric strength both upper and lower extremities.      Assessment & Plan:  You are presently in normal sinus rhythm on exam.  No atrial fibrillation heard.  Would continue with the cardiologist recommendations/continuing the Cardizem.  If you have any recurrent atrial fibrillation events then notify the atrial fibrillation clinic.  Hopefully on titrating up on the dose as they have done you will not have any further events.  For further events consider ablation  procedure.  Discuss further with specialist.  Today we will get CMP, CBC, TSH and A1c.  Follow-up date to be determined after lab review.

## 2018-04-23 ENCOUNTER — Ambulatory Visit (HOSPITAL_COMMUNITY)
Admission: RE | Admit: 2018-04-23 | Discharge: 2018-04-23 | Disposition: A | Discharge status: Home / Self Care | Payer: BC Managed Care – PPO | Source: Ambulatory Visit | Attending: Cardiovascular Disease | Admitting: Cardiovascular Disease

## 2018-04-23 DIAGNOSIS — I480 Paroxysmal atrial fibrillation: Secondary | ICD-10-CM | POA: Insufficient documentation

## 2018-04-23 LAB — MYOCARDIAL PERFUSION IMAGING
CHL CUP NUCLEAR SDS: 0
CHL CUP NUCLEAR SSS: 1
CSEPED: 7 min
CSEPEW: 8.3 METS
CSEPPHR: 142 {beats}/min
Exercise duration (sec): 41 s
LV dias vol: 99 mL (ref 46–106)
LV sys vol: 37 mL
MPHR: 162 {beats}/min
NUC STRESS TID: 1.26
Percent HR: 87 %
RPE: 18
Rest HR: 57 {beats}/min
SRS: 1

## 2018-04-23 MED ORDER — REGADENOSON 0.4 MG/5ML IV SOLN: 0.4000 mg | Freq: Once | INTRAVENOUS | Status: DC

## 2018-04-23 MED ORDER — TECHNETIUM TC 99M TETROFOSMIN IV KIT
31.1000 | PACK | Freq: Once | INTRAVENOUS | Status: AC | PRN
  Administered 2018-04-23: 31.1 via INTRAVENOUS
  Filled 2018-04-23: qty 32

## 2018-04-23 MED ORDER — TECHNETIUM TC 99M TETROFOSMIN IV KIT
10.1000 | PACK | Freq: Once | INTRAVENOUS | Status: AC | PRN
  Administered 2018-04-23: 10.1 via INTRAVENOUS
  Filled 2018-04-23: qty 11

## 2018-04-24 ENCOUNTER — Encounter: Payer: Self-pay | Admitting: Gastroenterology

## 2018-05-04 ENCOUNTER — Ambulatory Visit (HOSPITAL_BASED_OUTPATIENT_CLINIC_OR_DEPARTMENT_OTHER): Payer: BC Managed Care – PPO | Attending: Physician Assistant | Admitting: Cardiology

## 2018-05-04 DIAGNOSIS — R0683 Snoring: Secondary | ICD-10-CM | POA: Insufficient documentation

## 2018-05-04 DIAGNOSIS — I480 Paroxysmal atrial fibrillation: Secondary | ICD-10-CM | POA: Diagnosis not present

## 2018-05-05 NOTE — Procedures (Signed)
   Patient Name: Alexandria [name], Alexandria [name] Date:10/23/2017 05/04/2018 Gender: Female D.O.B: [birth year] Age (years): 31 Referring Provider: Melina Copa Height (inches): 58 Interpreting Physician: Fransico Him MD, ABSM Weight (lbs): 180 RPSGT: Earney Hamburg BMI: 28 MRN: [id number] Neck Size: 14.00  CLINICAL INFORMATION Sleep Study Type: NPSG  Indication for sleep study: Snoring  Epworth Sleepiness Score: 8  SLEEP STUDY TECHNIQUE As per the AASM Manual for the Scoring of Sleep and Associated Events v2.3 (April 2016) with a hypopnea requiring 4% desaturations.  The channels recorded and monitored were frontal, central and occipital EEG, electrooculogram (EOG), submentalis EMG (chin), nasal and oral airflow, thoracic and abdominal wall motion, anterior tibialis EMG, snore microphone, electrocardiogram, and pulse oximetry.  MEDICATIONS Medications self-administered by patient taken the night of the study : ATROVASTAT, DILITAZEM, [place] The study was initiated at 10:11:03 PM and ended at 4:27:39 AM.  Sleep onset time was 49.4 minutes and the sleep efficiency was 60.9%%. The total sleep time was 229.2 minutes.  Stage REM latency was 288.5 minutes.  The patient spent 3.1%% of the night in stage N1 sleep, 81.5%% in stage N2 sleep, 0.0%% in stage N3 and 15.49% in REM.  Alpha intrusion was absent.  Supine sleep was 58.33%.  RESPIRATORY PARAMETERS The overall apnea/hypopnea index (AHI) was 3.7 per hour. There were 8 total apneas, including 6 obstructive, 2 central and 0 mixed apneas. There were 6 hypopneas and 10 RERAs.  The AHI during Stage REM sleep was 22.0 per hour.  AHI while supine was 6.3 per hour.  The mean oxygen saturation was 92.4%. The minimum SpO2 during sleep was 88.0%.  moderate snoring was noted during this study.  CARDIAC DATA The 2 lead EKG demonstrated sinus rhythm. The mean heart rate was 57.0 beats per minute. Other EKG findings include:  None.  LEG MOVEMENT DATA The total PLMS were 0 with a resulting PLMS index of 0.0. Associated arousal with leg movement index was 0.0 .  IMPRESSIONS - No significant obstructive sleep apnea occurred during this study (AHI = 3.7/h). - No significant central sleep apnea occurred during this study (CAI = 0.5/h). - The patient had minimal or no oxygen desaturation during the study (Min O2 = 88.0%) - The patient snored with moderate snoring volume. - No cardiac abnormalities were noted during this study. - Clinically significant periodic limb movements did not occur during sleep. No significant associated arousals.  DIAGNOSIS - Normal Study  RECOMMENDATIONS - Avoid alcohol, sedatives and other CNS depressants that may worsen sleep apnea and disrupt normal sleep architecture. - Sleep hygiene should be reviewed to assess factors that may improve sleep quality. - Weight management and regular exercise should be initiated or continued if appropriate.  [Electronically signed] 05/05/2018 12:17 PM  Fransico Him MD, ABSM Diplomate, American Board of Sleep Medicine

## 2018-05-06 NOTE — Progress Notes (Signed)
Please inform patient normal study. Dayna Dunn PA-C

## 2018-05-07 ENCOUNTER — Telehealth: Payer: Self-pay | Admitting: *Deleted

## 2018-05-07 ENCOUNTER — Encounter (INDEPENDENT_AMBULATORY_CARE_PROVIDER_SITE_OTHER): Payer: Self-pay

## 2018-05-07 NOTE — Telephone Encounter (Signed)
Informed patient of sleep study results and patient understanding was verbalized. Patient understands her sleep study showed no significant sleep apnea.   Pt is aware and agreeable to normal results.  

## 2018-05-07 NOTE — Telephone Encounter (Signed)
-----   Message from Sueanne Margarita, MD sent at 05/06/2018  9:24 AM EDT ----- Regarding: RE: HELP Sorry I clicked the wrong smart phrase.  Tell her she has no sleep apnea  Traci ----- Message ----- From: Freada Bergeron, CMA Sent: 05/06/2018   8:51 AM To: Sueanne Margarita, MD Subject: HELP                                           Not sure what to tell the patient.Marland KitchenMarland KitchenYes you passed your titration or you don't have sleep apnea. Please advise, Gae Bon ----- Message ----- From: Sueanne Margarita, MD Sent: 05/05/2018  12:20 PM To: Freada Bergeron, CMA  Please let patient know that they had a successful PAP titration and let DME know that orders are in EPIC.  Please set up 10 week OV with me. Please let patient know that sleep study showed no significant sleep apnea.

## 2018-05-09 NOTE — Progress Notes (Signed)
Electrophysiology Office Note Date: 05/10/2018  ID:  Alexandria [name], DOB [birth year], MRN [id number]  PCP: Carollee Herter, Alferd Apa, DO Electrophysiologist: Caryl Comes  CC: AF follow up  Alexandria [name] is a 57 y.o. female seen today for Dr Caryl Comes.   The patient was initially diagnosed with atrial fibrillation 04/04/18 after palpitations were identified as AF on Kardia app.  She had been having palpitations and in the past had NSVT so obtained Kardia to determine if recurrent palpitations were NSVT.  She went to the hospital for evaluation and converted to SR on Diltiazem.  At office visit with me in AF clinic, myoview and sleep study were ordered. Myoview low risk. Sleep study was negative.  Since last being seen by me in the AF clinic, she has had 4 episodes of AF, the longest lasted 18 hours. She took extra Diltiazem but it did not terminate the episode and just made her feel poorly. She is interested in ablation.   She denies chest pain, dyspnea, PND, orthopnea, nausea, vomiting, dizziness, syncope, edema, weight gain, or early satiety.  Past Medical History:  Diagnosis Date  . Anxiety   . Graves disease   . Hiatal hernia    a. seen on CT 12/2016.  Marland Kitchen Hyperlipidemia   . Hypertension   . NSVT (nonsustained ventricular tachycardia) ([place])   . Pulmonary nodules    a. seen on CT 12/2016.  . Stroke ([place])   . TIA (transient ischemic attack) 01/2017   a. tx at [facility].   Past Surgical History:  Procedure Laterality Date  . CARPAL TUNNEL RELEASE      Current Outpatient Medications  Medication Sig Dispense Refill  . acetaminophen (TYLENOL) 325 MG tablet Take 2 tablets (650 mg total) by mouth every 4 (four) hours as needed for headache or mild pain. 30 tablet 0  . ALPRAZolam (XANAX) 0.5 MG tablet TAKE 1/2 TO 1 TABLET BY MOUTH TWICE A DAY AS NEEDED 30 tablet o  . apixaban (ELIQUIS) 5 MG TABS tablet Take 1 tablet (5 mg total) by mouth 2 (two) times daily. 60 tablet 5  . atorvastatin (LIPITOR) 40 MG  tablet TAKE 1 TABLET (40 MG TOTAL) BY MOUTH DAILY. 90 tablet 3  . diltiazem (CARDIZEM CD) 240 MG 24 hr capsule Take 1 capsule (240 mg total) by mouth daily. 30 capsule 3  . diltiazem (CARDIZEM) 30 MG tablet Take 1 tablet (30 mg total) by mouth daily as needed. For palpitations/heart rate above 110bpm 20 tablet 1  . levothyroxine (SYNTHROID, LEVOTHROID) 112 MCG tablet Take 112 mcg by mouth daily.  6  . flecainide (TAMBOCOR) 50 MG tablet Take 1 tablet (50 mg total) by mouth 2 (two) times daily. 180 tablet 3   No current facility-administered medications for this visit.     Allergies:   Peanut-containing drug products   Social History: Social History   Socioeconomic History  . Marital status: Divorced    Spouse name: Not on file  . Number of children: Not on file  . Years of education: Not on file  . Highest education level: Not on file  Occupational History  . Not on file  Social Needs  . Financial resource strain: Not on file  . Food insecurity:    Worry: Not on file    Inability: Not on file  . Transportation needs:    Medical: Not on file    Non-medical: Not on file  Tobacco Use  . Smoking status: Never Smoker  .  Smokeless tobacco: Never Used  Substance and Sexual Activity  . Alcohol use: Yes    Alcohol/week: 0.6 oz    Types: 1 Glasses of wine per week    Comment: occasionally  . Drug use: No  . Sexual activity: Yes  Lifestyle  . Physical activity:    Days per week: Not on file    Minutes per session: Not on file  . Stress: Not on file  Relationships  . Social connections:    Talks on phone: Not on file    Gets together: Not on file    Attends religious service: Not on file    Active member of club or organization: Not on file    Attends meetings of clubs or organizations: Not on file    Relationship status: Not on file  . Intimate partner violence:    Fear of current or ex partner: Not on file    Emotionally abused: Not on file    Physically abused: Not on  file    Forced sexual activity: Not on file  Other Topics Concern  . Not on file  Social History Narrative  . Not on file    Family History: Family History  Problem Relation Age of Onset  . Coronary artery disease Father   . Atrial fibrillation Father   . Diabetes Maternal Grandmother   . Hyperlipidemia Mother   . Dementia Mother   . Coronary artery disease Paternal Grandfather   . Coronary artery disease Paternal Uncle     Review of Systems: All other systems reviewed and are otherwise negative except as noted above.   Physical Exam: VS:  BP 110/76   Pulse (!) 57   Ht 5\' 7"  (1.702 m)   Wt 184 lb (83.5 kg)   LMP 01/26/2012   SpO2 97%   BMI 28.82 kg/m  , BMI Body mass index is 28.82 kg/m. Wt Readings from Last 3 Encounters:  05/10/18 184 lb (83.5 kg)  05/04/18 180 lb (81.6 kg)  04/23/18 185 lb (83.9 kg)    GEN- The patient is well appearing, alert and oriented x 3 today.   HEENT: normocephalic, atraumatic; sclera clear, conjunctiva pink; hearing intact; oropharynx clear; neck supple  Lungs- Clear to ausculation bilaterally, normal work of breathing.  No wheezes, rales, rhonchi Heart- Regular rate and rhythm  GI- soft, non-tender, non-distended, bowel sounds present  Extremities- no clubbing, cyanosis, or edema  MS- no significant deformity or atrophy Skin- warm and dry, no rash or lesion  Psych- euthymic mood, full affect Neuro- strength and sensation are intact   EKG:  EKG is ordered today. The ekg ordered today shows sinus bradycardia, rate 57, normal intervals  Recent Labs: 04/04/2018: Hemoglobin 14.1; Platelets 292 04/05/2018: B Natriuretic Peptide 120.4 04/22/2018: ALT 17; BUN 19; Creatinine, Ser 0.80; Potassium 4.3; Sodium 140; TSH 0.65    Assessment and Plan: 1.  Paroxysmal atrial fibrillation Continue Eliquis for CHADS2VASC of at least 4 Recent myoview low risk.  With recurrent AF episodes, will start Flecainide 50mg  twice daily for rhythm control.  Will go ahead and refer to Dr Rayann Heman for ablation discussion. She is interested in being considered for early ablation.  She has colonoscopy scheduled in the next few weeks and has been asked to hold Eliquis for 5 days. She is very nervous about holding [place] - we discussed today. She will discuss with GI and call us back with concerns.   2.  NSVT No recurrence Myoview low risk   Current  medicines are reviewed at length with the patient today.   The patient does not have concerns regarding her medicines.  The following changes were made today:  Start Flecainide 50mg  twice daily  Labs/ tests ordered today include: none Orders Placed This Encounter  Procedures  . EKG 12-Lead     Disposition:   Follow up with EKG in 1 week, Dr Rayann Heman 4 weeks.    Signed, Chanetta Marshall, NP 05/10/2018 9:20 AM   [facility] [address] [place] [address] [phone] (office) [phone] (fax)

## 2018-05-10 ENCOUNTER — Ambulatory Visit: Payer: BC Managed Care – PPO | Admitting: Nurse Practitioner

## 2018-05-10 ENCOUNTER — Encounter: Payer: Self-pay | Admitting: Nurse Practitioner

## 2018-05-10 VITALS — BP 110/76 | HR 57 | Ht 67.0 in | Wt 184.0 lb

## 2018-05-10 DIAGNOSIS — I472 Ventricular tachycardia: Secondary | ICD-10-CM

## 2018-05-10 DIAGNOSIS — I480 Paroxysmal atrial fibrillation: Secondary | ICD-10-CM | POA: Diagnosis not present

## 2018-05-10 DIAGNOSIS — I4729 Other ventricular tachycardia: Secondary | ICD-10-CM

## 2018-05-10 MED ORDER — FLECAINIDE ACETATE 50 MG PO TABS: 50.0000 mg | ORAL_TABLET | Freq: Two times a day (BID) | ORAL | 3 refills | Status: DC

## 2018-05-10 NOTE — Patient Instructions (Addendum)
Medication Instructions:   START TAKING FLECAINIDE  50 MG TWICE DAY    If you need a refill on your cardiac medications before your next appointment, please call your pharmacy.  Labwork: NONE ORDERED  TODAY    Testing/Procedures: NONE ORDERED  TODAY    Follow-Up:  WEEK AFTER [place]  NURSE VISIT WITH EKG  NEW MEDICATION START ?( PATIENT KNOWS DATE)    With  ALLRED IN 4 WEEKS ( CALL NURSE IF NOT AVAILABLE)   Any Other Special Instructions Will Be Listed Below (If Applicable).

## 2018-05-20 ENCOUNTER — Ambulatory Visit (INDEPENDENT_AMBULATORY_CARE_PROVIDER_SITE_OTHER): Payer: BC Managed Care – PPO

## 2018-05-20 VITALS — BP 121/77 | HR 66 | Ht 67.0 in | Wt 184.0 lb

## 2018-05-20 DIAGNOSIS — I480 Paroxysmal atrial fibrillation: Secondary | ICD-10-CM

## 2018-05-20 NOTE — Progress Notes (Signed)
1.) Reason for visit:  EKG s/p flecainide start  2.) Name of MD requesting visit: Chanetta Marshall, NP  3.) H&P: Pt with visit with AS on 05/10/2018.  AS had ordered Pt to start flecainide 50 mg BID at that time.  Per Pt she took first dose that same night and had palpitations and was up all night feeling bad.  She stopped taking flecainide after that dose.  Then she went in to Afib on May 15, 2018 and was in it for a few hours and decided to try the flecainide again.  She said it stopped her afib and she has been taking it BID since then with no further palpitations.  4.) ROS related to problem: 12 lead EKG s/p starting flecainide.    5.) Assessment and plan per MD:  EKG reviewed by DOD.  No changes today.  Pt continues to be in sinus.  QRS 90.  Pt has follow up with Dr. Rayann Heman to discuss ablation.

## 2018-05-20 NOTE — Patient Instructions (Signed)
Medication Instructions:  Your physician recommends that you continue on your current medications as directed. Please refer to the Current Medication list given to you today.  Labwork: None ordered.  Testing/Procedures: None ordered.  Follow-Up: With Dr. Rayann Heman as scheduled.  Any Other Special Instructions Will Be Listed Below (If Applicable).  If you need a refill on your cardiac medications before your next appointment, please call your pharmacy.

## 2018-05-31 ENCOUNTER — Ambulatory Visit: Payer: BC Managed Care – PPO | Admitting: Internal Medicine

## 2018-06-01 ENCOUNTER — Telehealth: Payer: Self-pay | Admitting: Physician Assistant

## 2018-06-01 NOTE — Telephone Encounter (Signed)
Pt is on vacation. Called stating she accidentally took her 240 mg cardizem this morning after taking it last night. The Cardizem is in her "night" pills and she accidentally took her night pills this morning. We discussed that her heart rate my go low and her pressure might be low. She has kardia and I asked her to check her heart rate. She just took the medicine 15 min ago and was trying to make herself vomit. I discouraged vomiting. We discussed the dangers of her falling or passing out from bradycardia or hypotension while on eliquis. She will be cautious today and [place] not go in the ocean. We discussed when she should go to the ER today. She expressed understanding of the plan.

## 2018-06-10 ENCOUNTER — Ambulatory Visit: Payer: BC Managed Care – PPO | Admitting: Internal Medicine

## 2018-06-20 ENCOUNTER — Encounter: Payer: BC Managed Care – PPO | Admitting: Gastroenterology

## 2018-06-27 ENCOUNTER — Encounter: Payer: BC Managed Care – PPO | Admitting: Gastroenterology

## 2018-07-04 ENCOUNTER — Encounter: Payer: Self-pay | Admitting: Medical

## 2018-07-08 ENCOUNTER — Encounter: Payer: Self-pay | Admitting: Internal Medicine

## 2018-07-12 ENCOUNTER — Other Ambulatory Visit (HOSPITAL_COMMUNITY): Payer: Self-pay | Admitting: Nurse Practitioner

## 2018-07-25 ENCOUNTER — Encounter: Payer: Self-pay | Admitting: Internal Medicine

## 2018-07-25 ENCOUNTER — Ambulatory Visit: Payer: BC Managed Care – PPO | Admitting: Internal Medicine

## 2018-07-25 VITALS — BP 124/78 | HR 51 | Ht 67.0 in | Wt 182.0 lb

## 2018-07-25 DIAGNOSIS — I472 Ventricular tachycardia: Secondary | ICD-10-CM

## 2018-07-25 DIAGNOSIS — I480 Paroxysmal atrial fibrillation: Secondary | ICD-10-CM | POA: Diagnosis not present

## 2018-07-25 DIAGNOSIS — I4729 Other ventricular tachycardia: Secondary | ICD-10-CM

## 2018-07-25 MED ORDER — METOPROLOL TARTRATE 50 MG PO TABS: 50.0000 mg | ORAL_TABLET | Freq: Once | ORAL | 0 refills | Status: DC

## 2018-07-25 NOTE — Patient Instructions (Addendum)
Medication Instructions:  Your physician recommends that you continue on your current medications as directed. Please refer to the Current Medication list given to you today. If you need a refill on your cardiac medications before your next appointment, please call your pharmacy.  Labwork: You will get lab work within 30 days of your procedure:  BMP and CBC.  Please schedule lab work within 30 days of September 12, 2018  Testing/Procedures:  Your physician has requested that you have cardiac CT. Cardiac computed tomography (CT) is a painless test that uses an x-ray machine to take clear, detailed pictures of your heart. For further information please visit [email or web address]. Please follow instruction sheet as given.  You will get a call from our office to schedule the date for this test.  Your physician has recommended that you have an ablation. Catheter ablation is a medical procedure used to treat some cardiac arrhythmias (irregular heartbeats). During catheter ablation, a long, thin, flexible tube is put into a blood vessel in your groin (upper thigh), or neck. This tube is called an ablation catheter. It is then guided to your heart through the blood vessel. Radio frequency waves destroy small areas of heart tissue where abnormal heartbeats may cause an arrhythmia to start. Please see the instruction sheet given to you today.  Follow-Up: You will follow up with Roderic Palau, NP with the Atrial fibrillation (Afib) clinic 4 weeks after your ablation.  You will follow up with Dr. Rayann Heman 3 months after your procedure.    CARDIAC CT INSTRUCTIONS:  Please arrive at the [facility] main entrance of [facility] [place], [place] [address] [phone]  Proceed to the [facility] Radiology Department (First Floor).  Please follow these instructions carefully (unless otherwise directed):  On the Night Before the Test: . Drink plenty of  water. . Do not consume any caffeinated/decaffeinated beverages or chocolate 12 hours prior to your test. . Do not take any antihistamines 12 hours prior to your test.  On the Day of the Test: . Drink plenty of water. Do not drink any water within one hour of the test. . Do not eat any food 4 hours prior to the test. . You may take your regular medications prior to the test. . IF NOT ON A BETA BLOCKER - Take 50 mg of lopressor (metoprolol) one hour before the test.    After the Test: . Drink plenty of water. . After receiving IV contrast, you may experience a mild flushed feeling. This is normal. . On occasion, you may experience a mild rash up to 24 hours after the test. This is not dangerous. If this occurs, you can take Benadryl 25 mg and increase your fluid intake. . If you experience trouble breathing, this can be serious. If it is severe call 911 IMMEDIATELY. If it is mild, please call our office. . If you take any of these medications: Glipizide/Metformin, Avandament, Glucavance, please do not take 48 hours after completing test.    ABLATION INSTRUCTIONS:  Please arrive at the [facility] main entrance of [facility] hospital at: 5:30 am on September 12, 2018 Do not eat or drink after midnight prior to procedure On the morning of your procedure do not take any medications (no Eliquis morning of only) Do NOT miss any doses of your blood thinner Plan for one night stay.   You will need someone to drive you home at discharge.     Cardiac Ablation  Cardiac ablation is a procedure to disable (ablate) a small amount of heart tissue in very specific places. The heart has many electrical connections. Sometimes these connections are abnormal and can cause the heart to beat very fast or irregularly. Ablating some of the problem areas can improve the heart rhythm or return it to normal. Ablation may be done for people who:  Have Wolff-Parkinson-White syndrome.  Have fast heart rhythms  (tachycardia).  Have taken medicines for an abnormal heart rhythm (arrhythmia) that were not effective or caused side effects.  Have a high-risk heartbeat that may be life-threatening.  During the procedure, a small incision is made in the neck or the groin, and a long, thin, flexible tube (catheter) is inserted into the incision and moved to the heart. Small devices (electrodes) on the tip of the catheter will send out electrical currents. A type of X-ray (fluoroscopy) will be used to help guide the catheter and to provide images of the heart. Tell a health care provider about:  Any allergies you have.  All medicines you are taking, including vitamins, herbs, eye drops, creams, and over-the-counter medicines.  Any problems you or family members have had with anesthetic medicines.  Any blood disorders you have.  Any surgeries you have had.  Any medical conditions you have, such as kidney failure.  Whether you are pregnant or may be pregnant. What are the risks? Generally, this is a safe procedure. However, problems may occur, including:  Infection.  Bruising and bleeding at the catheter insertion site.  Bleeding into the chest, especially into the sac that surrounds the heart. This is a serious complication.  Stroke or blood clots.  Damage to other structures or organs.  Allergic reaction to medicines or dyes.  Need for a permanent pacemaker if the normal electrical system is damaged. A pacemaker is a small computer that sends electrical signals to the heart and helps your heart beat normally.  The procedure not being fully effective. This may not be recognized until months later. Repeat ablation procedures are sometimes required.  What happens before the procedure?  Follow instructions from your health care provider about eating or drinking restrictions.  Ask your health care provider about: ? Changing or stopping your regular medicines. This is especially important if  you are taking diabetes medicines or blood thinners. ? Taking medicines such as aspirin and ibuprofen. These medicines can thin your blood. Do not take these medicines before your procedure if your health care provider instructs you not to.  Plan to have someone take you home from the hospital or clinic.  If you will be going home right after the procedure, plan to have someone with you for 24 hours. What happens during the procedure?  To lower your risk of infection: ? Your health care team will wash or sanitize their hands. ? Your skin will be washed with soap. ? Hair may be removed from the incision area.  An IV tube will be inserted into one of your veins.  You will be given a medicine to help you relax (sedative).  The skin on your neck or groin will be numbed.  An incision will be made in your neck or your groin.  A needle will be inserted through the incision and into a large vein in your neck or groin.  A catheter will be inserted into the needle and moved to your heart.  Dye may be injected through the catheter to help your surgeon see the area of the  heart that needs treatment.  Electrical currents will be sent from the catheter to ablate heart tissue in desired areas. There are three types of energy that may be used to ablate heart tissue: ? Heat (radiofrequency energy). ? Laser energy. ? Extreme cold (cryoablation).  When the necessary tissue has been ablated, the catheter will be removed.  Pressure will be held on the catheter insertion area to prevent excessive bleeding.  A bandage (dressing) will be placed over the catheter insertion area. The procedure may vary among health care providers and hospitals. What happens after the procedure?  Your blood pressure, heart rate, breathing rate, and blood oxygen level will be monitored until the medicines you were given have worn off.  Your catheter insertion area will be monitored for bleeding. You will need to lie  still for a few hours to ensure that you do not bleed from the catheter insertion area.  Do not drive for 24 hours or as long as directed by your health care provider. Summary  Cardiac ablation is a procedure to disable (ablate) a small amount of heart tissue in very specific places. Ablating some of the problem areas can improve the heart rhythm or return it to normal.  During the procedure, electrical currents will be sent from the catheter to ablate heart tissue in desired areas. This information is not intended to replace advice given to you by your health care provider. Make sure you discuss any questions you have with your health care provider. Document Released: 03/11/2009 Document Revised: 09/11/2016 Document Reviewed: 09/11/2016 Elsevier Interactive Patient Education  [employer].

## 2018-07-25 NOTE — Progress Notes (Signed)
PCP: [name], DO  Primary EP: Dr Lillie Fragmin is a 57 y.o. female who presents today for routine electrophysiology followup.  Since last being seen in our clinic, the patient reports doing very well. she continues to have afib.  She reports palpitations and fatigue with afib.  I have reviewed her Jodelle Red today which reveals AF.  She is unaware of triggers/ precipitants.  She does feel that her afib has improved with flecainide, exercise, and ETOH avoidance.  She feels "horrible" in afib and is fearful of recurrence. Today, she denies symptoms of chest pain, shortness of breath,  lower extremity edema, dizziness, presyncope, or syncope.  The patient is otherwise without complaint today.   Past Medical History:  Diagnosis Date  . Anxiety   . Graves disease   . Hiatal hernia    a. seen on CT 12/2016.  Marland Kitchen Hyperlipidemia   . Hypertension   . NSVT (nonsustained ventricular tachycardia) ([place])   . Pulmonary nodules    a. seen on CT 12/2016.  . Stroke ([place])   . TIA (transient ischemic attack) 01/2017   a. tx at [facility].   Past Surgical History:  Procedure Laterality Date  . CARPAL TUNNEL RELEASE      ROS- all systems are reviewed and negatives except as per HPI above  Current Outpatient Medications  Medication Sig Dispense Refill  . acetaminophen (TYLENOL) 325 MG tablet Take 2 tablets (650 mg total) by mouth every 4 (four) hours as needed for headache or mild pain. 30 tablet 0  . ALPRAZolam (XANAX) 0.5 MG tablet TAKE 1/2 TO 1 TABLET BY MOUTH TWICE A DAY AS NEEDED 30 tablet o  . apixaban (ELIQUIS) 5 MG TABS tablet Take 1 tablet (5 mg total) by mouth 2 (two) times daily. 60 tablet 5  . atorvastatin (LIPITOR) 40 MG tablet TAKE 1 TABLET (40 MG TOTAL) BY MOUTH DAILY. 90 tablet 3  . diltiazem (CARDIZEM CD) 240 MG 24 hr capsule TAKE 1 CAPSULE BY MOUTH EVERY DAY 90 capsule 2  . diltiazem (CARDIZEM) 30 MG tablet Take 1 tablet (30 mg total) by mouth daily as needed. For  palpitations/heart rate above 110bpm 20 tablet 1  . flecainide (TAMBOCOR) 50 MG tablet Take 1 tablet (50 mg total) by mouth 2 (two) times daily. 180 tablet 3  . levothyroxine (SYNTHROID, LEVOTHROID) 112 MCG tablet Take 112 mcg by mouth daily.  6   No current facility-administered medications for this visit.     Physical Exam: Vitals:   07/25/18 1522  BP: 124/78  Pulse: (!) 51  SpO2: 98%  Weight: 182 lb (82.6 kg)  Height: 5\' 7"  (1.702 m)    GEN- The patient is well appearing, alert and oriented x 3 today.   Head- normocephalic, atraumatic Eyes-  Sclera clear, conjunctiva pink Ears- hearing intact Oropharynx- clear Lungs- Clear to ausculation bilaterally, normal work of breathing Heart- Regular rate and rhythm, no murmurs, rubs or gallops, PMI not laterally displaced GI- soft, NT, ND, + BS Extremities- no clubbing, cyanosis, or edema  Wt Readings from Last 3 Encounters:  07/25/18 182 lb (82.6 kg)  05/20/18 184 lb (83.5 kg)  05/10/18 184 lb (83.5 kg)    EKG tracing ordered today is personally reviewed and shows sinus rhythm 51 bpm, PR 208 msec, QRS 94 msec, QTc 412 msec  Echo 04/05/18 reveals EF 60%, mild MR, mild LA enlargement  Assessment and Plan:  1. Paroxysmal atrial fibrillation The patient has symptomatic,  recurrent paroxysmal atrial fibrillation. she has failed medical therapy with flecainide. Chads2vasc score is 4.  she is anticoagulated with eliquis . Therapeutic strategies for afib including medicine and ablation were discussed in detail with the patient today. Risk, benefits, and alternatives to EP study and radiofrequency ablation for afib were also discussed in detail today. These risks include but are not limited to stroke, bleeding, vascular damage, tamponade, perforation, damage to the esophagus, lungs, and other structures, pulmonary vein stenosis, worsening renal function, and death. The patient understands these risk and wishes to proceed.  We will therefore  proceed with catheter ablation at the next available time.  Carto, ICE, anesthesia are requested for the procedure.  Will also obtain prior to the procedure to exclude LAA thrombus and further evaluate atrial anatomy.    Thompson Grayer MD, [facility] 07/25/2018 4:08 PM

## 2018-08-26 ENCOUNTER — Encounter (HOSPITAL_COMMUNITY): Payer: Self-pay | Admitting: Nurse Practitioner

## 2018-08-26 ENCOUNTER — Ambulatory Visit (HOSPITAL_COMMUNITY)
Admission: RE | Admit: 2018-08-26 | Discharge: 2018-08-26 | Disposition: A | Discharge status: Home / Self Care | Payer: BC Managed Care – PPO | Source: Ambulatory Visit | Attending: Nurse Practitioner | Admitting: Nurse Practitioner

## 2018-08-26 VITALS — BP 108/66 | HR 56 | Ht 67.0 in | Wt 185.0 lb

## 2018-08-26 DIAGNOSIS — I10 Essential (primary) hypertension: Secondary | ICD-10-CM | POA: Diagnosis not present

## 2018-08-26 DIAGNOSIS — I480 Paroxysmal atrial fibrillation: Secondary | ICD-10-CM | POA: Insufficient documentation

## 2018-08-26 DIAGNOSIS — E785 Hyperlipidemia, unspecified: Secondary | ICD-10-CM | POA: Diagnosis not present

## 2018-08-26 DIAGNOSIS — E0500 Thyrotoxicosis with diffuse goiter without thyrotoxic crisis or storm: Secondary | ICD-10-CM | POA: Insufficient documentation

## 2018-08-26 DIAGNOSIS — Z7901 Long term (current) use of anticoagulants: Secondary | ICD-10-CM | POA: Insufficient documentation

## 2018-08-26 DIAGNOSIS — K449 Diaphragmatic hernia without obstruction or gangrene: Secondary | ICD-10-CM | POA: Diagnosis not present

## 2018-08-26 DIAGNOSIS — Z79899 Other long term (current) drug therapy: Secondary | ICD-10-CM | POA: Diagnosis not present

## 2018-08-26 DIAGNOSIS — F419 Anxiety disorder, unspecified: Secondary | ICD-10-CM | POA: Insufficient documentation

## 2018-08-26 DIAGNOSIS — Z8673 Personal history of transient ischemic attack (TIA), and cerebral infarction without residual deficits: Secondary | ICD-10-CM | POA: Diagnosis not present

## 2018-08-26 NOTE — H&P (View-Only) (Signed)
Primary Care Physician: Carollee Herter, Alferd Apa, DO Referring Physician: Dr. Domingo Sep is a 57 y.o. female with a h/o paroxysmal afib that is in the afib clinic for f/u of afib over the  week end for 20 hours. She is pending ablation 11/4. She was using several doses of the prn 30 mg Cardizem and was not checking her BP. She does not have a cuff. She was back in rhythm this am but felt so washed out she asked to ber seen. Her BP now is 841 systolic and she is back in SR. AShe ddi not go to work as she felt so lightheaded.  Today, she denies symptoms of palpitations, chest pain, shortness of breath, orthopnea, PND, lower extremity edema, dizziness, presyncope, syncope, or neurologic sequela. The patient is tolerating medications without difficulties and is otherwise without complaint today.   Past Medical History:  Diagnosis Date  . Anxiety   . Graves disease   . Hiatal hernia    a. seen on CT 12/2016.  Marland Kitchen Hyperlipidemia   . Hypertension   . NSVT (nonsustained ventricular tachycardia) ([place])   . Pulmonary nodules    a. seen on CT 12/2016.  . Stroke ([place])   . TIA (transient ischemic attack) 01/2017   a. tx at [facility].   Past Surgical History:  Procedure Laterality Date  . CARPAL TUNNEL RELEASE      Current Outpatient Medications  Medication Sig Dispense Refill  . acetaminophen (TYLENOL) 325 MG tablet Take 2 tablets (650 mg total) by mouth every 4 (four) hours as needed for headache or mild pain. 30 tablet 0  . ALPRAZolam (XANAX) 0.5 MG tablet TAKE 1/2 TO 1 TABLET BY MOUTH TWICE A DAY AS NEEDED 30 tablet o  . apixaban (ELIQUIS) 5 MG TABS tablet Take 1 tablet (5 mg total) by mouth 2 (two) times daily. 60 tablet 5  . atorvastatin (LIPITOR) 40 MG tablet TAKE 1 TABLET (40 MG TOTAL) BY MOUTH DAILY. 90 tablet 3  . diltiazem (CARDIZEM CD) 240 MG 24 hr capsule TAKE 1 CAPSULE BY MOUTH EVERY DAY 90 capsule 2  . diltiazem (CARDIZEM) 30 MG tablet Take 1 tablet (30 mg total) by mouth  daily as needed. For palpitations/heart rate above 110bpm 20 tablet 1  . flecainide (TAMBOCOR) 50 MG tablet Take 1 tablet (50 mg total) by mouth 2 (two) times daily. 180 tablet 3  . levothyroxine (SYNTHROID, LEVOTHROID) 112 MCG tablet Take 112 mcg by mouth daily.  6  . metoprolol tartrate (LOPRESSOR) 50 MG tablet Take 1 tablet (50 mg total) by mouth once for 1 dose. Take one tablet by mouth once one hour prior to cardiac CT (Patient not taking: Reported on 08/26/2018) 1 tablet 0   No current facility-administered medications for this encounter.     Allergies  Allergen Reactions  . Peanut-Containing Drug Products Swelling    Social History   Socioeconomic History  . Marital status: Divorced    Spouse name: Not on file  . Number of children: Not on file  . Years of education: Not on file  . Highest education level: Not on file  Occupational History  . Not on file  Social Needs  . Financial resource strain: Not on file  . Food insecurity:    Worry: Not on file    Inability: Not on file  . Transportation needs:    Medical: Not on file    Non-medical: Not on file  Tobacco Use  .  Smoking status: Never Smoker  . Smokeless tobacco: Never Used  Substance and Sexual Activity  . Alcohol use: Yes    Alcohol/week: 1.0 standard drinks    Types: 1 Glasses of wine per week    Comment: occasionally  . Drug use: No  . Sexual activity: Yes  Lifestyle  . Physical activity:    Days per week: Not on file    Minutes per session: Not on file  . Stress: Not on file  Relationships  . Social connections:    Talks on phone: Not on file    Gets together: Not on file    Attends religious service: Not on file    Active member of club or organization: Not on file    Attends meetings of clubs or organizations: Not on file    Relationship status: Not on file  . Intimate partner violence:    Fear of current or ex partner: Not on file    Emotionally abused: Not on file    Physically abused: Not  on file    Forced sexual activity: Not on file  Other Topics Concern  . 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 Coronary artery disease Father   . Atrial fibrillation Father   . Diabetes Maternal Grandmother   . Hyperlipidemia Mother   . Dementia Mother   . Coronary artery disease Paternal Grandfather   . Coronary artery disease Paternal Uncle     ROS- All systems are reviewed and negative except as per the HPI above  Physical Exam: Vitals:   08/26/18 1501  BP: 108/66  Pulse: (!) 56  Weight: 83.9 kg  Height: 5\' 7"  (1.702 m)   Wt Readings from Last 3 Encounters:  08/26/18 83.9 kg  07/25/18 82.6 kg  05/20/18 83.5 kg    Labs: Lab Results  Component Value Date   NA 140 04/22/2018   K 4.3 04/22/2018   CL 104 04/22/2018   CO2 29 04/22/2018   GLUCOSE 103 (H) 04/22/2018   BUN 19 04/22/2018   CREATININE 0.80 04/22/2018   CALCIUM 9.3 04/22/2018   No results found for: INR Lab Results  Component Value Date   CHOL 169 11/27/2011   HDL 36.20 (L) 11/27/2011   LDLCALC 97 11/27/2011   TRIG 180.0 (H) 11/27/2011     GEN- The patient is well appearing, alert and oriented x 3 today.   Head- normocephalic, atraumatic Eyes-  Sclera clear, conjunctiva pink Ears- hearing intact Oropharynx- clear Neck- supple, no JVP Lymph- no cervical lymphadenopathy Lungs- Clear to ausculation bilaterally, normal work of breathing Heart- Regular rate and rhythm, no murmurs, rubs or gallops, PMI not laterally displaced GI- soft, NT, ND, + BS Extremities- no clubbing, cyanosis, or edema MS- no significant deformity or atrophy Skin- no rash or lesion Psych- euthymic mood, full affect Neuro- strength and sensation are intact  EKG- Sinus brady at 56 bpm, Pr int 192 ms, qrs int 92 ms, qtc 411 ms Epic records reviewed    Assessment and Plan: 1. Paroxysmal afib  20 hours over the  weekend but now in [place] very lightheaded after returning to SR possibly from hypotension from extra Cardizem She was encouraged to get a BP cuff Offered to increase flecainide to 100 mg bid but she states that she only gets epiosodes about q 6 weeks so she thinks she will  be fine until ablation Work note written for her today  Geroge Baseman. Daylyn Azbill, [facility] [address] [place], [place] [address] [phone]

## 2018-08-26 NOTE — Progress Notes (Signed)
Primary Care Physician: Carollee Herter, Alferd Apa, DO Referring Physician: Dr. Domingo Sep is a 57 y.o. female with a h/o paroxysmal afib that is in the afib clinic for f/u of afib over the  week end for 20 hours. She is pending ablation 11/4. She was using several doses of the prn 30 mg Cardizem and was not checking her BP. She does not have a cuff. She was back in rhythm this am but felt so washed out she asked to ber seen. Her BP now is 409 systolic and she is back in SR. AShe ddi not go to work as she felt so lightheaded.  Today, she denies symptoms of palpitations, chest pain, shortness of breath, orthopnea, PND, lower extremity edema, dizziness, presyncope, syncope, or neurologic sequela. The patient is tolerating medications without difficulties and is otherwise without complaint today.   Past Medical History:  Diagnosis Date  . Anxiety   . Graves disease   . Hiatal hernia    a. seen on CT 12/2016.  Marland Kitchen Hyperlipidemia   . Hypertension   . NSVT (nonsustained ventricular tachycardia) ([place])   . Pulmonary nodules    a. seen on CT 12/2016.  . Stroke ([place])   . TIA (transient ischemic attack) 01/2017   a. tx at [facility].   Past Surgical History:  Procedure Laterality Date  . CARPAL TUNNEL RELEASE      Current Outpatient Medications  Medication Sig Dispense Refill  . acetaminophen (TYLENOL) 325 MG tablet Take 2 tablets (650 mg total) by mouth every 4 (four) hours as needed for headache or mild pain. 30 tablet 0  . ALPRAZolam (XANAX) 0.5 MG tablet TAKE 1/2 TO 1 TABLET BY MOUTH TWICE A DAY AS NEEDED 30 tablet o  . apixaban (ELIQUIS) 5 MG TABS tablet Take 1 tablet (5 mg total) by mouth 2 (two) times daily. 60 tablet 5  . atorvastatin (LIPITOR) 40 MG tablet TAKE 1 TABLET (40 MG TOTAL) BY MOUTH DAILY. 90 tablet 3  . diltiazem (CARDIZEM CD) 240 MG 24 hr capsule TAKE 1 CAPSULE BY MOUTH EVERY DAY 90 capsule 2  . diltiazem (CARDIZEM) 30 MG tablet Take 1 tablet (30 mg total) by mouth  daily as needed. For palpitations/heart rate above 110bpm 20 tablet 1  . flecainide (TAMBOCOR) 50 MG tablet Take 1 tablet (50 mg total) by mouth 2 (two) times daily. 180 tablet 3  . levothyroxine (SYNTHROID, LEVOTHROID) 112 MCG tablet Take 112 mcg by mouth daily.  6  . metoprolol tartrate (LOPRESSOR) 50 MG tablet Take 1 tablet (50 mg total) by mouth once for 1 dose. Take one tablet by mouth once one hour prior to cardiac CT (Patient not taking: Reported on 08/26/2018) 1 tablet 0   No current facility-administered medications for this encounter.     Allergies  Allergen Reactions  . Peanut-Containing Drug Products Swelling    Social History   Socioeconomic History  . Marital status: Divorced    Spouse name: Not on file  . Number of children: Not on file  . Years of education: Not on file  . Highest education level: Not on file  Occupational History  . Not on file  Social Needs  . Financial resource strain: Not on file  . Food insecurity:    Worry: Not on file    Inability: Not on file  . Transportation needs:    Medical: Not on file    Non-medical: Not on file  Tobacco Use  .  Smoking status: Never Smoker  . Smokeless tobacco: Never Used  Substance and Sexual Activity  . Alcohol use: Yes    Alcohol/week: 1.0 standard drinks    Types: 1 Glasses of wine per week    Comment: occasionally  . Drug use: No  . Sexual activity: Yes  Lifestyle  . Physical activity:    Days per week: Not on file    Minutes per session: Not on file  . Stress: Not on file  Relationships  . Social connections:    Talks on phone: Not on file    Gets together: Not on file    Attends religious service: Not on file    Active member of club or organization: Not on file    Attends meetings of clubs or organizations: Not on file    Relationship status: Not on file  . Intimate partner violence:    Fear of current or ex partner: Not on file    Emotionally abused: Not on file    Physically abused: Not  on file    Forced sexual activity: Not on file  Other Topics Concern  . 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 Coronary artery disease Father   . Atrial fibrillation Father   . Diabetes Maternal Grandmother   . Hyperlipidemia Mother   . Dementia Mother   . Coronary artery disease Paternal Grandfather   . Coronary artery disease Paternal Uncle     ROS- All systems are reviewed and negative except as per the HPI above  Physical Exam: Vitals:   08/26/18 1501  BP: 108/66  Pulse: (!) 56  Weight: 83.9 kg  Height: 5\' 7"  (1.702 m)   Wt Readings from Last 3 Encounters:  08/26/18 83.9 kg  07/25/18 82.6 kg  05/20/18 83.5 kg    Labs: Lab Results  Component Value Date   NA 140 04/22/2018   K 4.3 04/22/2018   CL 104 04/22/2018   CO2 29 04/22/2018   GLUCOSE 103 (H) 04/22/2018   BUN 19 04/22/2018   CREATININE 0.80 04/22/2018   CALCIUM 9.3 04/22/2018   No results found for: INR Lab Results  Component Value Date   CHOL 169 11/27/2011   HDL 36.20 (L) 11/27/2011   LDLCALC 97 11/27/2011   TRIG 180.0 (H) 11/27/2011     GEN- The patient is well appearing, alert and oriented x 3 today.   Head- normocephalic, atraumatic Eyes-  Sclera clear, conjunctiva pink Ears- hearing intact Oropharynx- clear Neck- supple, no JVP Lymph- no cervical lymphadenopathy Lungs- Clear to ausculation bilaterally, normal work of breathing Heart- Regular rate and rhythm, no murmurs, rubs or gallops, PMI not laterally displaced GI- soft, NT, ND, + BS Extremities- no clubbing, cyanosis, or edema MS- no significant deformity or atrophy Skin- no rash or lesion Psych- euthymic mood, full affect Neuro- strength and sensation are intact  EKG- Sinus brady at 56 bpm, Pr int 192 ms, qrs int 92 ms, qtc 411 ms Epic records reviewed    Assessment and Plan: 1. Paroxysmal afib  20 hours over the  weekend but now in [place] very lightheaded after returning to SR possibly from hypotension from extra Cardizem She was encouraged to get a BP cuff Offered to increase flecainide to 100 mg bid but she states that she only gets epiosodes about q 6 weeks so she thinks she will  be fine until ablation Work note written for her today  Geroge Baseman. [place], [facility] [address] [place], [place] [address] [phone]

## 2018-09-05 ENCOUNTER — Ambulatory Visit (HOSPITAL_COMMUNITY)
Admission: RE | Admit: 2018-09-05 | Discharge: 2018-09-05 | Disposition: A | Discharge status: Home / Self Care | Payer: BC Managed Care – PPO | Source: Ambulatory Visit | Attending: Internal Medicine | Admitting: Internal Medicine

## 2018-09-05 ENCOUNTER — Other Ambulatory Visit: Payer: Self-pay | Admitting: Internal Medicine

## 2018-09-05 ENCOUNTER — Ambulatory Visit (HOSPITAL_COMMUNITY): Payer: BC Managed Care – PPO

## 2018-09-05 DIAGNOSIS — K449 Diaphragmatic hernia without obstruction or gangrene: Secondary | ICD-10-CM | POA: Diagnosis not present

## 2018-09-05 DIAGNOSIS — I480 Paroxysmal atrial fibrillation: Secondary | ICD-10-CM

## 2018-09-05 MED ORDER — IOPAMIDOL (ISOVUE-370) INJECTION 76%
100.0000 mL | Freq: Once | INTRAVENOUS | Status: AC | PRN
  Administered 2018-09-05: 100 mL via INTRAVENOUS

## 2018-09-06 LAB — BASIC METABOLIC PANEL
BUN/Creatinine Ratio: 21 (ref 9–23)
BUN: 16 mg/dL (ref 6–24)
CALCIUM: 9.1 mg/dL (ref 8.7–10.2)
CO2: 24 mmol/L (ref 20–29)
Chloride: 103 mmol/L (ref 96–106)
Creatinine, Ser: 0.75 mg/dL (ref 0.57–1.00)
GFR calc Af Amer: 101 mL/min/{1.73_m2} (ref >59)
GFR, EST NON AFRICAN AMERICAN: 88 mL/min/{1.73_m2} (ref >59)
Glucose: 86 mg/dL (ref 65–99)
POTASSIUM: 3.9 mmol/L (ref 3.5–5.2)
Sodium: 142 mmol/L (ref 134–144)

## 2018-09-06 LAB — CBC/DIFF AMBIGUOUS DEFAULT
BASOS: 1 %
Basophils Absolute: 0.1 10*3/uL (ref 0.0–0.2)
EOS (ABSOLUTE): 0.1 10*3/uL (ref 0.0–0.4)
EOS: 2 %
HEMATOCRIT: 38 % (ref 34.0–46.6)
Hemoglobin: 12.7 g/dL (ref 11.1–15.9)
IMMATURE GRANS (ABS): 0.1 10*3/uL (ref 0.0–0.1)
IMMATURE GRANULOCYTES: 1 %
LYMPHS: 20 %
Lymphocytes Absolute: 1.6 10*3/uL (ref 0.7–3.1)
MCH: 29.7 pg (ref 26.6–33.0)
MCHC: 33.4 g/dL (ref 31.5–35.7)
MCV: 89 fL (ref 79–97)
MONOCYTES: 8 %
Monocytes Absolute: 0.6 10*3/uL (ref 0.1–0.9)
NEUTROS PCT: 68 %
Neutrophils Absolute: 5.4 10*3/uL (ref 1.4–7.0)
Platelets: 308 10*3/uL (ref 150–450)
RBC: 4.27 x10E6/uL (ref 3.77–5.28)
RDW: 13.1 % (ref 12.3–15.4)
WBC: 7.8 10*3/uL (ref 3.4–10.8)

## 2018-09-06 LAB — SPECIMEN STATUS REPORT

## 2018-09-09 ENCOUNTER — Telehealth: Payer: Self-pay | Admitting: Internal Medicine

## 2018-09-09 NOTE — Telephone Encounter (Signed)
  Patient is having a catheterization done on 09/12/18 by Dr Rayann Heman and would like to ask a few questions regarding procedure. 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

## 2018-09-09 NOTE — Telephone Encounter (Signed)
All questions answered to pt's satisfaction at this time ./cy

## 2018-09-12 ENCOUNTER — Ambulatory Visit (HOSPITAL_COMMUNITY): Payer: BC Managed Care – PPO | Admitting: Certified Registered Nurse Anesthetist

## 2018-09-12 ENCOUNTER — Encounter (HOSPITAL_COMMUNITY)
Admission: RE | Disposition: A | Discharge status: Home / Self Care | Payer: Self-pay | Source: Ambulatory Visit | Attending: Internal Medicine

## 2018-09-12 ENCOUNTER — Ambulatory Visit (HOSPITAL_COMMUNITY)
Admission: RE | Admit: 2018-09-12 | Discharge: 2018-09-12 | Disposition: A | Discharge status: Home / Self Care | Payer: BC Managed Care – PPO | Source: Ambulatory Visit | Attending: Internal Medicine | Admitting: Internal Medicine

## 2018-09-12 ENCOUNTER — Encounter (HOSPITAL_COMMUNITY): Payer: Self-pay | Admitting: Anesthesiology

## 2018-09-12 ENCOUNTER — Other Ambulatory Visit: Payer: Self-pay

## 2018-09-12 DIAGNOSIS — Z7901 Long term (current) use of anticoagulants: Secondary | ICD-10-CM | POA: Diagnosis not present

## 2018-09-12 DIAGNOSIS — Z79890 Hormone replacement therapy (postmenopausal): Secondary | ICD-10-CM | POA: Insufficient documentation

## 2018-09-12 DIAGNOSIS — Z8673 Personal history of transient ischemic attack (TIA), and cerebral infarction without residual deficits: Secondary | ICD-10-CM | POA: Diagnosis not present

## 2018-09-12 DIAGNOSIS — R918 Other nonspecific abnormal finding of lung field: Secondary | ICD-10-CM | POA: Insufficient documentation

## 2018-09-12 DIAGNOSIS — Z91010 Allergy to peanuts: Secondary | ICD-10-CM | POA: Diagnosis not present

## 2018-09-12 DIAGNOSIS — I472 Ventricular tachycardia: Secondary | ICD-10-CM | POA: Diagnosis not present

## 2018-09-12 DIAGNOSIS — Z8249 Family history of ischemic heart disease and other diseases of the circulatory system: Secondary | ICD-10-CM | POA: Diagnosis not present

## 2018-09-12 DIAGNOSIS — F419 Anxiety disorder, unspecified: Secondary | ICD-10-CM | POA: Diagnosis not present

## 2018-09-12 DIAGNOSIS — I10 Essential (primary) hypertension: Secondary | ICD-10-CM | POA: Diagnosis not present

## 2018-09-12 DIAGNOSIS — I480 Paroxysmal atrial fibrillation: Secondary | ICD-10-CM

## 2018-09-12 DIAGNOSIS — Z79899 Other long term (current) drug therapy: Secondary | ICD-10-CM | POA: Insufficient documentation

## 2018-09-12 DIAGNOSIS — E0500 Thyrotoxicosis with diffuse goiter without thyrotoxic crisis or storm: Secondary | ICD-10-CM | POA: Insufficient documentation

## 2018-09-12 DIAGNOSIS — Z9889 Other specified postprocedural states: Secondary | ICD-10-CM | POA: Diagnosis not present

## 2018-09-12 DIAGNOSIS — E785 Hyperlipidemia, unspecified: Secondary | ICD-10-CM | POA: Insufficient documentation

## 2018-09-12 HISTORY — PX: ATRIAL FIBRILLATION ABLATION: EP1191

## 2018-09-12 LAB — POCT ACTIVATED CLOTTING TIME
ACTIVATED CLOTTING TIME: 246 s
ACTIVATED CLOTTING TIME: 301 s
Activated Clotting Time: 180 seconds

## 2018-09-12 SURGERY — ATRIAL FIBRILLATION ABLATION
Anesthesia: General

## 2018-09-12 MED ORDER — SODIUM CHLORIDE 0.9 % IV SOLN
INTRAVENOUS | Status: DC | PRN
  Administered 2018-09-12: 25 ug/min via INTRAVENOUS

## 2018-09-12 MED ORDER — ONDANSETRON HCL 4 MG/2ML IJ SOLN: 4.0000 mg | Freq: Four times a day (QID) | INTRAMUSCULAR | Status: DC | PRN

## 2018-09-12 MED ORDER — SODIUM CHLORIDE 0.9% FLUSH: 3.0000 mL | Freq: Two times a day (BID) | INTRAVENOUS | Status: DC

## 2018-09-12 MED ORDER — ISOPROTERENOL HCL 0.2 MG/ML IJ SOLN
INTRAMUSCULAR | Status: AC
  Filled 2018-09-12: qty 5

## 2018-09-12 MED ORDER — APIXABAN 5 MG PO TABS
5.0000 mg | ORAL_TABLET | Freq: Once | ORAL | Status: AC
  Administered 2018-09-12: 5 mg via ORAL
  Filled 2018-09-12 (×2): qty 1

## 2018-09-12 MED ORDER — HEPARIN SODIUM (PORCINE) 1000 UNIT/ML IJ SOLN
INTRAMUSCULAR | Status: AC
  Filled 2018-09-12: qty 1

## 2018-09-12 MED ORDER — BUPIVACAINE HCL (PF) 0.25 % IJ SOLN
INTRAMUSCULAR | Status: DC | PRN
  Administered 2018-09-12: 30 mL

## 2018-09-12 MED ORDER — PROPOFOL 10 MG/ML IV BOLUS
INTRAVENOUS | Status: DC | PRN
  Administered 2018-09-12: 150 mg via INTRAVENOUS

## 2018-09-12 MED ORDER — SODIUM CHLORIDE 0.9% FLUSH: 3.0000 mL | INTRAVENOUS | Status: DC | PRN

## 2018-09-12 MED ORDER — BUPIVACAINE HCL (PF) 0.25 % IJ SOLN
INTRAMUSCULAR | Status: AC
  Filled 2018-09-12: qty 30

## 2018-09-12 MED ORDER — LIDOCAINE HCL (CARDIAC) PF 100 MG/5ML IV SOSY
PREFILLED_SYRINGE | INTRAVENOUS | Status: DC | PRN
  Administered 2018-09-12: 100 mg via INTRAVENOUS

## 2018-09-12 MED ORDER — ACETAMINOPHEN 325 MG PO TABS: 650.0000 mg | ORAL_TABLET | ORAL | Status: DC | PRN

## 2018-09-12 MED ORDER — ROCURONIUM BROMIDE 100 MG/10ML IV SOLN
INTRAVENOUS | Status: DC | PRN
  Administered 2018-09-12: 40 mg via INTRAVENOUS

## 2018-09-12 MED ORDER — HEPARIN (PORCINE) IN NACL 1000-0.9 UT/500ML-% IV SOLN
INTRAVENOUS | Status: DC | PRN
  Administered 2018-09-12: 500 mL

## 2018-09-12 MED ORDER — PROTAMINE SULFATE 10 MG/ML IV SOLN
INTRAVENOUS | Status: DC | PRN
  Administered 2018-09-12: 40 mg via INTRAVENOUS

## 2018-09-12 MED ORDER — SODIUM CHLORIDE 0.9 % IV SOLN
INTRAVENOUS | Status: DC
  Administered 2018-09-12: 07:00:00 via INTRAVENOUS

## 2018-09-12 MED ORDER — HEPARIN SODIUM (PORCINE) 1000 UNIT/ML IJ SOLN
INTRAMUSCULAR | Status: DC | PRN
  Administered 2018-09-12: 5000 [IU] via INTRAVENOUS
  Administered 2018-09-12: 4000 [IU] via INTRAVENOUS

## 2018-09-12 MED ORDER — MIDAZOLAM HCL 2 MG/2ML IJ SOLN
INTRAMUSCULAR | Status: DC | PRN
  Administered 2018-09-12: 2 mg via INTRAVENOUS

## 2018-09-12 MED ORDER — ISOPROTERENOL HCL 0.2 MG/ML IJ SOLN
INTRAVENOUS | Status: DC | PRN
  Administered 2018-09-12: 20 ug/min via INTRAVENOUS

## 2018-09-12 MED ORDER — HEPARIN SODIUM (PORCINE) 1000 UNIT/ML IJ SOLN
INTRAMUSCULAR | Status: DC | PRN
  Administered 2018-09-12: 1000 [IU] via INTRAVENOUS
  Administered 2018-09-12: 12000 [IU] via INTRAVENOUS

## 2018-09-12 MED ORDER — PHENYLEPHRINE 40 MCG/ML (10ML) SYRINGE FOR IV PUSH (FOR BLOOD PRESSURE SUPPORT)
PREFILLED_SYRINGE | INTRAVENOUS | Status: DC | PRN
  Administered 2018-09-12: 80 ug via INTRAVENOUS

## 2018-09-12 MED ORDER — FENTANYL CITRATE (PF) 100 MCG/2ML IJ SOLN
INTRAMUSCULAR | Status: DC | PRN
  Administered 2018-09-12: 100 ug via INTRAVENOUS

## 2018-09-12 MED ORDER — SODIUM CHLORIDE 0.9 % IV SOLN: 250.0000 mL | INTRAVENOUS | Status: DC | PRN

## 2018-09-12 MED ORDER — DEXAMETHASONE SODIUM PHOSPHATE 10 MG/ML IJ SOLN
INTRAMUSCULAR | Status: DC | PRN
  Administered 2018-09-12: 10 mg via INTRAVENOUS

## 2018-09-12 MED ORDER — HEPARIN (PORCINE) IN NACL 1000-0.9 UT/500ML-% IV SOLN
INTRAVENOUS | Status: AC
  Filled 2018-09-12: qty 500

## 2018-09-12 MED ORDER — HYDROCODONE-ACETAMINOPHEN 5-325 MG PO TABS: 1.0000 | ORAL_TABLET | ORAL | Status: DC | PRN

## 2018-09-12 MED ORDER — SUGAMMADEX SODIUM 200 MG/2ML IV SOLN
INTRAVENOUS | Status: DC | PRN
  Administered 2018-09-12: 160 mg via INTRAVENOUS

## 2018-09-12 MED ORDER — ONDANSETRON HCL 4 MG/2ML IJ SOLN
INTRAMUSCULAR | Status: DC | PRN
  Administered 2018-09-12: 4 mg via INTRAVENOUS

## 2018-09-12 SURGICAL SUPPLY — 19 items
BLANKET WARM UNDERBOD FULL ACC (MISCELLANEOUS) ×2 IMPLANT
CATH MAPPNG PENTARAY F 2-6-2MM (CATHETERS) IMPLANT
CATH NAVISTAR SMARTTOUCH DF (ABLATOR) ×2 IMPLANT
CATH SOUNDSTAR 3D IMAGING (CATHETERS) ×2 IMPLANT
CATH WEBSTER BI DIR CS D-F CRV (CATHETERS) ×2 IMPLANT
COVER SWIFTLINK CONNECTOR (BAG) ×2 IMPLANT
NDL BAYLIS TRANSSEPTAL 71CM (NEEDLE) IMPLANT
NEEDLE BAYLIS TRANSSEPTAL 71CM (NEEDLE) ×3 IMPLANT
PACK EP LATEX FREE (CUSTOM PROCEDURE TRAY) ×3
PACK EP LF (CUSTOM PROCEDURE TRAY) ×1 IMPLANT
PAD PRO RADIOLUCENT 2001M-C (PAD) ×3 IMPLANT
PATCH CARTO3 (PAD) ×2 IMPLANT
PENTARAY F 2-6-2MM (CATHETERS) ×3
SHEATH AVANTI 11F 11CM (SHEATH) ×2 IMPLANT
SHEATH PINNACLE 7F 10CM (SHEATH) ×4 IMPLANT
SHEATH PINNACLE 9F 10CM (SHEATH) ×2 IMPLANT
SHEATH SWARTZ TS SL2 63CM 8.5F (SHEATH) ×2 IMPLANT
TUBING SMART ABLATE COOLFLOW (TUBING) ×2 IMPLANT
WIRE GUIDERIGHT .032X180 (WIRE) ×2 IMPLANT

## 2018-09-12 NOTE — Anesthesia Postprocedure Evaluation (Signed)
Anesthesia Post Note  Patient: Alexandria [name]  Procedure(s) Performed: ATRIAL FIBRILLATION ABLATION (N/A )     Patient location during evaluation: Cath Lab Anesthesia Type: General Level of consciousness: awake and sedated Pain management: pain level controlled Vital Signs Assessment: post-procedure vital signs reviewed and stable Respiratory status: spontaneous breathing Cardiovascular status: stable Postop Assessment: no apparent nausea or vomiting Anesthetic complications: no    Last Vitals:  Vitals:   09/12/18 1025 09/12/18 1027  BP: 136/74   Pulse: (!) 56   Resp: 10   Temp:  36.5 C  SpO2: 98%     Last Pain:  Vitals:   09/12/18 1027  TempSrc: Temporal  PainSc:    Pain Goal:                 Huston Foley

## 2018-09-12 NOTE — Discharge Instructions (Signed)
You have an appointment set up with the [facility].  Multiple studies have shown that being followed by a dedicated atrial fibrillation clinic in addition to the standard care you receive from your other physicians improves health. We believe that enrollment in the atrial fibrillation clinic will allow [place] to better care for you.   The phone number to the [facility] is [phone]. The clinic is staffed Monday through Friday from 8:30am to 5pm.  Parking Directions: The clinic is located in the Heart and Vascular Building connected to [facility]. 1)From [address] turn on to [address] and go to the 3rd entrance  (Heart and Vascular entrance) on the right. 2)Look to the right for Heart &Vascular Parking Garage. 3)A code for the entrance is required please call the clinic to receive this.   4)Take the elevators to the 1st floor. Registration is in the room with the glass walls at the end of the hallway.  If you have any trouble parking or locating the clinic, please dont hesitate to call [phone].   Femoral Site Care Refer to this sheet in the next few weeks. These instructions provide you with information about caring for yourself after your procedure. Your health care provider may also give you more specific instructions. Your treatment has been planned according to current medical practices, but problems sometimes occur. Call your health care provider if you have any problems or questions after your procedure. What can I expect after the procedure? After your procedure, it is typical to have the following:  Bruising at the site that usually fades within 1-2 weeks.  Blood collecting in the tissue (hematoma) that may be painful to the touch. It should usually decrease in size and tenderness within 1-2 weeks.  Follow these instructions at home:  Take medicines only as directed by your health care provider.  You may shower 24-48 hours  after the procedure or as directed by your health care provider. Remove the bandage (dressing) and gently wash the site with plain soap and water. Pat the area dry with a clean towel. Do not rub the site, because this may cause bleeding.  Do not take baths, swim, or use a hot tub until your health care provider approves.  Check your insertion site every day for redness, swelling, or drainage.  Do not apply powder or lotion to the site.  Limit use of stairs to twice a day for the first 2-3 days or as directed by your health care provider.  Do not squat for the first 2-3 days or as directed by your health care provider.  Do not lift over 10 lb (4.5 kg) for 5 days after your procedure or as directed by your health care provider.  Ask your health care provider when it is okay to: ? Return to work or school. ? Resume usual physical activities or sports. ? Resume sexual activity.  Do not drive home if you are discharged the same day as the procedure. Have someone else drive you.  You may drive 24 hours after the procedure unless otherwise instructed by your health care provider.  Do not operate machinery or power tools for 24 hours after the procedure or as directed by your health care provider.  If your procedure was done as an outpatient procedure, which means that you went home the same day as your procedure, a responsible adult should be with you for the first 24 hours after you arrive home.  Keep all follow-up  visits as directed by your health care provider. This is important. Contact a health care provider if:  You have a fever.  You have chills.  You have increased bleeding from the site. Hold pressure on the site. Get help right away if:  You have unusual pain at the site.  You have redness, warmth, or swelling at the site.  You have drainage (other than a small amount of blood on the dressing) from the site.  The site is bleeding, and the bleeding does not stop after 30  minutes of holding steady pressure on the site.  Your leg or foot becomes pale, cool, tingly, or numb. This information is not intended to replace advice given to you by your health care provider. Make sure you discuss any questions you have with your health care provider. Document Released: 06/26/2014 Document Revised: 03/30/2016 Document Reviewed: 05/12/2014 Elsevier Interactive Patient Education  2018 [employer].   No driving for 4 days. No lifting over 5 lbs for 1 week. No sexual activity for 1 week. You may return to work in 1 week. Keep procedure site clean & dry. If you notice increased pain, swelling, bleeding or pus, call/return!  You may shower, but no soaking baths/hot tubs/pools for 1 week.    Cardiac Ablation, Care After This sheet gives you information about how to care for yourself after your procedure. Your health care provider may also give you more specific instructions. If you have problems or questions, contact your health care provider. What can I expect after the procedure? After the procedure, it is common to have:  Bruising around your puncture site.  Tenderness around your puncture site.  Skipped heartbeats.  Tiredness (fatigue).  Follow these instructions at home: Puncture site care  Follow instructions from your health care provider about how to take care of your puncture site. Make sure you: ? Wash your hands with soap and water before you change your bandage (dressing). If soap and water are not available, use hand sanitizer. ? Change your dressing as told by your health care provider. ? Leave stitches (sutures), skin glue, or adhesive strips in place. These skin closures may need to stay in place for up to 2 weeks. If adhesive strip edges start to loosen and curl up, you may trim the loose edges. Do not remove adhesive strips completely unless your health care provider tells you to do that.  Check your puncture site every day for signs of infection.  Check for: ? Redness, swelling, or pain. ? Fluid or blood. If your puncture site starts to bleed, lie down on your back, apply firm pressure to the area, and contact your health care provider. ? Warmth. ? Pus or a bad smell. Driving  Ask your health care provider when it is safe for you to drive again after the procedure.  Do not drive or use heavy machinery while taking prescription pain medicine.  Do not drive for 24 hours if you were given a medicine to help you relax (sedative) during your procedure. Activity  Avoid activities that take a lot of effort for at least 3 days after your procedure.  Do not lift anything that is heavier than 10 lb (4.5 kg), or the limit that you are told, until your health care provider says that it is safe.  Return to your normal activities as told by your health care provider. Ask your health care provider what activities are safe for you. General instructions  Take over-the-counter and prescription medicines  only as told by your health care provider.  Do not use any products that contain nicotine or tobacco, such as cigarettes and e-cigarettes. If you need help quitting, ask your health care provider.  Do not take baths, swim, or use a hot tub until your health care provider approves.  Do not drink alcohol for 24 hours after your procedure.  Keep all follow-up visits as told by your health care provider. This is important. Contact a health care provider if:  You have redness, mild swelling, or pain around your puncture site.  You have fluid or blood coming from your puncture site that stops after applying firm pressure to the area.  Your puncture site feels warm to the touch.  You have pus or a bad smell coming from your puncture site.  You have a fever.  You have chest pain or discomfort that spreads to your neck, jaw, or arm.  You are sweating a lot.  You feel nauseous.  You have a fast or irregular heartbeat.  You have shortness  of breath.  You are dizzy or light-headed and feel the need to lie down.  You have pain or numbness in the arm or leg closest to your puncture site. Get help right away if:  Your puncture site suddenly swells.  Your puncture site is bleeding and the bleeding does not stop after applying firm pressure to the area. These symptoms may represent a serious problem that is an emergency. Do not wait to see if the symptoms will go away. Get medical help right away. Call your local emergency services (911 in the [place] Do not drive yourself to the hospital. Summary  After the procedure, it is normal to have bruising and tenderness at the puncture site in your groin, neck, or forearm.  Check your puncture site every day for signs of infection.  Get help right away if your puncture site is bleeding and the bleeding does not stop after applying firm pressure to the area. This is a medical emergency. This information is not intended to replace advice given to you by your health care provider. Make sure you discuss any questions you have with your health care provider. Document Released: 02/01/2017 Document Revised: 02/01/2017 Document Reviewed: 02/01/2017 Elsevier Interactive Patient Education  2018 [employer].

## 2018-09-12 NOTE — Anesthesia Procedure Notes (Signed)
Procedure Name: Intubation Date/Time: 09/12/2018 7:45 AM Performed by: Raenette Rover, CRNA Pre-anesthesia Checklist: Patient identified, Emergency Drugs available, Suction available and Patient being monitored Patient Re-evaluated:Patient Re-evaluated prior to induction Oxygen Delivery Method: Circle system utilized Preoxygenation: Pre-oxygenation with 100% oxygen Induction Type: IV induction Ventilation: Mask ventilation without difficulty Laryngoscope Size: Mac and 3 Grade View: Grade I Tube type: Oral Tube size: 7.0 mm Number of attempts: 1 Airway Equipment and Method: Stylet Placement Confirmation: ETT inserted through vocal cords under direct vision,  positive ETCO2,  CO2 detector and breath sounds checked- equal and bilateral Secured at: 21 cm Tube secured with: Tape Dental Injury: Teeth and Oropharynx as per pre-operative assessment

## 2018-09-12 NOTE — Anesthesia Preprocedure Evaluation (Signed)
Anesthesia Evaluation  Patient identified by MRN, date of birth, ID band Patient awake    Reviewed: Allergy & Precautions, NPO status , Patient's Chart, lab work & pertinent test results  Airway Mallampati: I       Dental no notable dental hx. (+) Teeth Intact   Pulmonary neg pulmonary ROS,    Pulmonary exam normal breath sounds clear to auscultation       Cardiovascular hypertension, Pt. on medications Normal cardiovascular exam Rhythm:Irregular Rate:Normal     Neuro/Psych Anxiety    GI/Hepatic Neg liver ROS, hiatal hernia,   Endo/Other  Hyperthyroidism   Renal/GU negative Renal ROS  negative genitourinary   Musculoskeletal   Abdominal Normal abdominal exam  (+)   Peds  Hematology   Anesthesia Other Findings Alexandria [name]  GATED SPECT [facility] PERF W/EXERCISE STRESS 1D  Order# [id number]  Reading physician: Larey Dresser, MD Ordering physician: Patsey Berthold, NP Study date: 04/23/18 Patient Information   Name MRN Description Alexandria [name] [id number] 57 y.o. female Result Notes for MYOCARDIAL PERFUSION IMAGING   Notes recorded by Juluis Mire, RN on 04/24/2018 at 11:27 AM EDT Pt notified. Pt will call if afib burden increases and will start flecainide at that time. ------  Notes recorded by Patsey Berthold, NP on 04/24/2018 at 7:23 AM EDT Low risk myoview   Vitals   Height Weight BMI (Calculated) 5\' 7"  (1.702 m) 83.5 kg 28.81 Study Highlights     Nuclear stress EF: 63%.  The left ventricular ejection fraction is normal (55-65%).  Blood pressure demonstrated a normal response to exercise.  Upsloping ST segment depression ST segment depression of 1 mm was noted during stress in the II, III, aVF, V4 and V5 leads, and returning to baseline after less than 1 minute of recovery.  This is a low risk study.   1. EF 63%, normal wall motion.  2. No evidence for ischemia or infarction on perfusion images.    3. Nonspecific upsloping ST depression in several leads.    Low risk study based on perfusion images.      Reproductive/Obstetrics                             Anesthesia Physical Anesthesia Plan  ASA: II  Anesthesia Plan: General   Post-op Pain Management:    Induction: Intravenous  PONV Risk Score and Plan: 2 and Ondansetron  Airway Management Planned: Oral ETT  Additional Equipment:   Intra-op Plan:   Post-operative Plan:   Informed Consent: I have reviewed the patients History and Physical, chart, labs and discussed the procedure including the risks, benefits and alternatives for the proposed anesthesia with the patient or authorized representative who has indicated his/her understanding and acceptance.   Dental advisory given  Plan Discussed with: CRNA and Surgeon  Anesthesia Plan Comments:         Anesthesia Quick Evaluation

## 2018-09-12 NOTE — Interval H&P Note (Signed)
History and Physical Interval Note:  09/12/2018 7:14 AM  Alexandria [name]  has presented today for surgery, with the diagnosis of afib  The various methods of treatment have been discussed with the patient and family. After consideration of risks, benefits and other options for treatment, the patient has consented to  Procedure(s): ATRIAL FIBRILLATION ABLATION (N/A) as a surgical intervention .  The patient's history has been reviewed, patient examined, no change in status, stable for surgery.  I have reviewed the patient's chart and labs.  Questions were answered to the patient's satisfaction.    The patient has symptomatic, recurrent paroxysmal atrial fibrillation.  she is anticoagulated with eliquis.  She reports compliance with eliquis without interruption.  Cardiac CT reviewed with her today.  Labs also reviewed with her from lab cor.  She is aware of hiatal hernia found on CT.  She reports that she has been struggling with nightly reflux/ indigestion and has planned to have this evaluated already.   Therapeutic strategies for afib including medicine and ablation were discussed in detail with the patient today. Risk, benefits, and alternatives to EP study and radiofrequency ablation for afib were also discussed in detail today. These risks include but are not limited to stroke, bleeding, vascular damage, tamponade, perforation, damage to the esophagus, lungs, and other structures, pulmonary vein stenosis, worsening renal function, and death. The patient understands these risk and wishes to proceed.   Thompson Grayer MD, [facility] 09/12/2018 7:15 AM

## 2018-09-12 NOTE — Transfer of Care (Signed)
Immediate Anesthesia Transfer of Care Note  Patient: Alexandria [name]  Procedure(s) Performed: ATRIAL FIBRILLATION ABLATION (N/A )  Patient Location: PACU  Anesthesia Type:General  Level of Consciousness: awake, alert , oriented and patient cooperative  Airway & Oxygen Therapy: Patient Spontanous Breathing and Patient connected to face mask oxygen  Post-op Assessment: Report given to RN and Post -op Vital signs reviewed and stable  Post vital signs: Reviewed and stable  Last Vitals:  Vitals Value Taken Time  BP 137/73 09/12/2018  9:55 AM  Temp    Pulse 75 09/12/2018  9:56 AM  Resp 14 09/12/2018  9:56 AM  SpO2 97 % 09/12/2018  9:56 AM  Vitals shown include unvalidated device data.  Last Pain:  Vitals:   09/12/18 8335  TempSrc:   PainSc: 0-No pain         Complications: No apparent anesthesia complications

## 2018-09-12 NOTE — Progress Notes (Signed)
Site area: rt groin 3 fv sheaths Site Prior to Removal:  Level 0 Pressure Applied For:  20 minutes Manual:   yes Patient Status During Pull:  stable Post Pull Site:  Level  0 Post Pull Instructions Given:  yes Post Pull Pulses Present:  Rt dp palpable Dressing Applied:  Gauze and tegaderm Bedrest begins @ 9826 Comments:  IV saline locked

## 2018-09-13 ENCOUNTER — Encounter (HOSPITAL_COMMUNITY): Payer: Self-pay | Admitting: Internal Medicine

## 2018-09-16 ENCOUNTER — Encounter: Payer: Self-pay | Admitting: Family Medicine

## 2018-09-16 ENCOUNTER — Ambulatory Visit: Payer: BC Managed Care – PPO | Admitting: Family Medicine

## 2018-09-16 VITALS — BP 104/50 | HR 58 | Temp 99.2°F | Resp 16 | Ht 67.0 in | Wt 187.6 lb

## 2018-09-16 DIAGNOSIS — K449 Diaphragmatic hernia without obstruction or gangrene: Secondary | ICD-10-CM

## 2018-09-16 DIAGNOSIS — Z23 Encounter for immunization: Secondary | ICD-10-CM

## 2018-09-16 DIAGNOSIS — K219 Gastro-esophageal reflux disease without esophagitis: Secondary | ICD-10-CM

## 2018-09-16 DIAGNOSIS — E663 Overweight: Secondary | ICD-10-CM | POA: Diagnosis not present

## 2018-09-16 MED ORDER — DEXLANSOPRAZOLE 60 MG PO CPDR: 60.0000 mg | DELAYED_RELEASE_CAPSULE | Freq: Every day | ORAL | 5 refills | Status: DC

## 2018-09-16 NOTE — Patient Instructions (Signed)
Hiatal Hernia  A hiatal hernia occurs when part of the stomach slides above the muscle that separates the abdomen from the chest (diaphragm). A person can be born with a hiatal hernia (congenital), or it may develop over time. In almost all cases of hiatal hernia, only the top part of the stomach pushes through the diaphragm.  Many people have a hiatal hernia with no symptoms. The larger the hernia, the more likely it is that you will have symptoms. In some cases, a hiatal hernia allows stomach acid to flow back into the tube that carries food from your mouth to your stomach (esophagus). This may cause heartburn symptoms. Severe heartburn symptoms may mean that you have developed a condition called gastroesophageal reflux disease (GERD).  What are the causes?  This condition is caused by a weakness in the opening (hiatus) where the esophagus passes through the diaphragm to attach to the upper part of the stomach. A person may be born with a weakness in the hiatus, or a weakness can develop over time.  What increases the risk?  This condition is more likely to develop in:  · Older people. Age is a major risk factor for a hiatal hernia, especially if you are over the age of 50.  · Pregnant women.  · People who are overweight.  · People who have frequent constipation.    What are the signs or symptoms?  Symptoms of this condition usually develop in the form of GERD symptoms. Symptoms include:  · Heartburn.  · Belching.  · Indigestion.  · Trouble swallowing.  · Coughing or wheezing.  · Sore throat.  · Hoarseness.  · Chest pain.  · Nausea and vomiting.    How is this diagnosed?  This condition may be diagnosed during testing for GERD. Tests that may be done include:  · X-rays of your stomach or chest.  · An upper gastrointestinal (GI) series. This is an X-ray exam of your GI tract that is taken after you swallow a chalky liquid that shows up clearly on the X-ray.  · Endoscopy. This is a procedure to look into your  stomach using a thin, flexible tube that has a tiny camera and light on the end of it.    How is this treated?  This condition may be treated by:  · Dietary and lifestyle changes to help reduce GERD symptoms.  · Medicines. These may include:  ? Over-the-counter antacids.  ? Medicines that make your stomach empty more quickly.  ? Medicines that block the production of stomach acid (H2 blockers).  ? Stronger medicines to reduce stomach acid (proton pump inhibitors).  · Surgery to repair the hernia, if other treatments are not helping.    If you have no symptoms, you may not need treatment.  Follow these instructions at home:  Lifestyle and activity  · Do not use any products that contain nicotine or tobacco, such as cigarettes and e-cigarettes. If you need help quitting, ask your health care provider.  · Try to achieve and maintain a healthy body weight.  · Avoid putting pressure on your abdomen. Anything that puts pressure on your abdomen increases the amount of acid that may be pushed up into your esophagus.  ? Avoid bending over, especially after eating.  ? Raise the head of your bed by putting blocks under the legs. This keeps your head and esophagus higher than your stomach.  ? Do not wear tight clothing around your chest or stomach.  ?   Try not to strain when having a bowel movement, when urinating, or when lifting heavy objects.  Eating and drinking  · Avoid foods that can worsen GERD symptoms. These may include:  ? Fatty foods, like fried foods.  ? Citrus fruits, like oranges or lemon.  ? Other foods and drinks that contain acid, like orange juice or tomatoes.  ? Spicy food.  ? Chocolate.  · Eat frequent small meals instead of three large meals a day. This helps prevent your stomach from getting too full.  ? Eat slowly.  ? Do not lie down right after eating.  ? Do not eat 1-2 hours before bed.  · Do not drink beverages with caffeine. These include cola, coffee, cocoa, and tea.  · Do not drink alcohol.  General  instructions  · Take over-the-counter and prescription medicines only as told by your health care provider.  · Keep all follow-up visits as told by your health care provider. This is important.  Contact a health care provider if:  · Your symptoms are not controlled with medicines or lifestyle changes.  · You are having trouble swallowing.  · You have coughing or wheezing that will not go away.  Get help right away if:  · Your pain is getting worse.  · Your pain spreads to your arms, neck, jaw, teeth, or back.  · You have shortness of breath.  · You sweat for no reason.  · You feel sick to your stomach (nauseous) or you vomit.  · You vomit blood.  · You have bright red blood in your stools.  · You have black, tarry stools.  This information is not intended to replace advice given to you by your health care provider. Make sure you discuss any questions you have with your health care provider.  Document Released: 01/13/2004 Document Revised: 10/16/2016 Document Reviewed: 10/16/2016  Elsevier Interactive Patient Education © 2018 [employer]

## 2018-09-16 NOTE — Progress Notes (Signed)
Patient ID: Alexandria [name], female    DOB: [birth year]  Age: 57 y.o. MRN: [id number]    Subjective:  Subjective  HPI Alexandria [name] presents for f/u CTA done by cardiology.  Hiatal hernia was seen so she was instructed to f/u pcp.  Pt has had some epigastric burning with eating.  Has not tried otc meds.  No other complaints.    cta reviewed   Review of Systems  Constitutional: Negative for appetite change, diaphoresis, fatigue and unexpected weight change.  Eyes: Negative for pain, redness and visual disturbance.  Respiratory: Negative for cough, chest tightness, shortness of breath and wheezing.   Cardiovascular: Negative for chest pain, palpitations and leg swelling.  Gastrointestinal: Positive for abdominal pain. Negative for anal bleeding, blood in stool, constipation, diarrhea and nausea.  Endocrine: Negative for cold intolerance, heat intolerance, polydipsia, polyphagia and polyuria.  Genitourinary: Negative for difficulty urinating, dysuria and frequency.  Neurological: Negative for dizziness, light-headedness, numbness and headaches.    History Past Medical History:  Diagnosis Date  . Anxiety   . Graves disease   . Hiatal hernia    a. seen on CT 12/2016.  Marland Kitchen Hyperlipidemia   . Hypertension   . NSVT (nonsustained ventricular tachycardia) ([place])   . Pulmonary nodules    a. seen on CT 12/2016.  . Stroke ([place])   . TIA (transient ischemic attack) 01/2017   a. tx at [facility].    She has a past surgical history that includes Carpal tunnel release and ATRIAL FIBRILLATION ABLATION (N/A, 09/12/2018).   Her family history includes Atrial fibrillation in her father; Coronary artery disease in her father, paternal grandfather, and paternal uncle; Dementia in her mother; Diabetes in her maternal grandmother; Hyperlipidemia in her mother.She reports that she has never smoked. She has never used smokeless tobacco. She reports that she drinks about 1.0 standard drinks of alcohol per week. She  reports that she does not use drugs.  Current Outpatient Medications on File Prior to Visit  Medication Sig Dispense Refill  . acetaminophen (TYLENOL) 500 MG tablet Take 500 mg by mouth 2 (two) times daily as needed for moderate pain or headache.    . ALPRAZolam (XANAX) 0.5 MG tablet TAKE 1/2 TO 1 TABLET BY MOUTH TWICE A DAY AS NEEDED (Patient taking differently: Take 0.25-0.5 mg by mouth 2 (two) times daily as needed for anxiety. ) 30 tablet o  . apixaban (ELIQUIS) 5 MG TABS tablet Take 1 tablet (5 mg total) by mouth 2 (two) times daily. 60 tablet 5  . atorvastatin (LIPITOR) 40 MG tablet TAKE 1 TABLET (40 MG TOTAL) BY MOUTH DAILY. (Patient taking differently: Take 40 mg by mouth every evening. ) 90 tablet 3  . diltiazem (CARDIZEM CD) 240 MG 24 hr capsule TAKE 1 CAPSULE BY MOUTH EVERY DAY (Patient taking differently: Take 240 mg by mouth every evening. ) 90 capsule 2  . diltiazem (CARDIZEM) 30 MG tablet Take 1 tablet (30 mg total) by mouth daily as needed. For palpitations/heart rate above 110bpm (Patient taking differently: Take 30 mg by mouth every 6 (six) hours as needed (For palpitations/heart rate above 110bpm). ) 20 tablet 1  . famotidine (PEPCID) 20 MG tablet Take 20 mg by mouth every evening.    . flecainide (TAMBOCOR) 50 MG tablet Take 1 tablet (50 mg total) by mouth 2 (two) times daily. 180 tablet 3  . levothyroxine (SYNTHROID, LEVOTHROID) 112 MCG tablet Take 112 mcg by mouth daily.  6  . omeprazole (PRILOSEC  OTC) 20 MG tablet Take 20 mg by mouth every evening.    Marland Kitchen oxymetazoline (AFRIN) 0.05 % nasal spray Place 1 spray into both nostrils daily as needed for congestion.    . Tetrahydrozoline HCl (VISINE OP) Place 1 drop into both eyes daily as needed (dry eyes).     No current facility-administered medications on file prior to visit.      Objective:  Objective  Physical Exam  Constitutional: She is oriented to person, place, and time. She appears well-developed and well-nourished.    HENT:  Head: Normocephalic and atraumatic.  Eyes: Conjunctivae and EOM are normal.  Neck: Normal range of motion. Neck supple. No JVD present. Carotid bruit is not present. No thyromegaly present.  Cardiovascular: Normal rate, regular rhythm and normal heart sounds.  No murmur heard. Pulmonary/Chest: Effort normal and breath sounds normal. No respiratory distress. She has no wheezes. She has no rales. She exhibits no tenderness.  Abdominal: Soft. She exhibits no distension and no mass. There is tenderness in the epigastric area. There is no rebound and no guarding. No hernia.  Musculoskeletal: She exhibits no edema.  Neurological: She is alert and oriented to person, place, and time.  Psychiatric: She has a normal mood and affect.  Nursing note and vitals reviewed.  BP (!) 104/50 (BP Location: Right Arm, Cuff Size: Normal)   Pulse (!) 58   Temp 99.2 F (37.3 C) (Oral)   Resp 16   Ht 5\' 7"  (1.702 m)   Wt 187 lb 9.6 oz (85.1 kg)   LMP 01/26/2012   SpO2 99%   BMI 29.38 kg/m  Wt Readings from Last 3 Encounters:  09/16/18 187 lb 9.6 oz (85.1 kg)  09/12/18 180 lb (81.6 kg)  08/26/18 185 lb (83.9 kg)     Lab Results  Component Value Date   WBC 7.8 09/05/2018   HGB 12.7 09/05/2018   HCT 38.0 09/05/2018   PLT 308 09/05/2018   GLUCOSE 86 09/05/2018   CHOL 169 11/27/2011   TRIG 180.0 (H) 11/27/2011   HDL 36.20 (L) 11/27/2011   LDLCALC 97 11/27/2011   ALT 17 04/22/2018   AST 12 04/22/2018   NA 142 09/05/2018   K 3.9 09/05/2018   CL 103 09/05/2018   CREATININE 0.75 09/05/2018   BUN 16 09/05/2018   CO2 24 09/05/2018   TSH 0.65 04/22/2018   HGBA1C 6.1 04/22/2018    Ct Cardiac Morph/pulm Vein W/cm&w/o Ca Score  Addendum Date: 09/06/2018   ADDENDUM REPORT: 09/06/2018 09:50 EXAM: OVER-READ INTERPRETATION  CT CHEST The following report is an over-read performed by radiologist Dr. Collene Leyden [facility] on 09/06/2018. This over-read does not include interpretation  of cardiac or coronary anatomy or pathology. The coronary CTA interpretation by the cardiologist is attached. COMPARISON:  Chest CT 01/02/2017 FINDINGS: Heart is normal size. Visualized aorta is normal caliber. Moderate-sized hiatal hernia. No adenopathy in the lower mediastinum or hila. Visualized lungs clear. No effusions. Imaging into the upper abdomen shows no acute findings. Chest wall soft tissues are unremarkable. No acute bony abnormality. IMPRESSION: No acute extra cardiac abnormality. Moderate-sized hiatal hernia. Electronically Signed   By: Rolm Baptise M.D.   On: 09/06/2018 09:50   Result Date: 09/06/2018 CLINICAL DATA:  57 year old female with h/o TIA and atrial fibrillation scheduled for an ablation. EXAM: Cardiac CT/CTA TECHNIQUE: The patient was scanned on a Siemens Somatom scanner. FINDINGS: A 120 kV prospective scan was triggered in the descending thoracic aorta at 111 HU's. Gantry  rotation speed was 280 msecs and collimation was .9 mm. No beta blockade and no NTG was given. The 3D data set was reconstructed in 5% intervals of the 60-80 % of the R-R cycle. Diastolic phases were analyzed on a dedicated work station using MPR, MIP and VRT modes. The patient received 80 cc of contrast. There is normal pulmonary vein drainage into the left atrium (2 on the right and 2 on the left) with ostial measurements as follows: RUPV: 20.0 x 18.5 mm RLPV: 20.4 x 17.2 mm LUPV: 19.6 x 17.4 mm LLPV: 18.0 x 17.0 mm The left atrial appendage is large with chicken wing morphology and no thrombus. The esophagus runs in the left atrial midline and is in the proximity to the LLPV. Aorta:  Normal caliber.  No dissection or calcifications. Aortic Valve:  Trileaflet.  No calcifications. Coronary Arteries: Normal coronary origin. Right dominance. The study was performed without use of NTG, however calcium score is 0 and there is no evidence for CAD. IMPRESSION: 1. There is normal pulmonary vein drainage into the left atrium.  2. The left atrial appendage is large with chicken wing morphology and no thrombus. 3. The esophagus runs in the left atrial midline and is in the proximity to the LLPV. 4. Coronary Arteries: Normal coronary origin. Right dominance. The study was performed without use of NTG, however calcium score is 0 and there is no evidence for CAD. Electronically Signed: By: Ena Dawley On: 09/06/2018 07:23     Assessment & Plan:  Plan  I am having Alexandria [name] start on dexlansoprazole. I am also having her maintain her ALPRAZolam, levothyroxine, atorvastatin, apixaban, diltiazem, flecainide, diltiazem, omeprazole, famotidine, acetaminophen, Tetrahydrozoline HCl (VISINE OP), and oxymetazoline.  Meds ordered this encounter  Medications  . dexlansoprazole (DEXILANT) 60 MG capsule    Sig: Take 1 capsule (60 mg total) by mouth daily.    Dispense:  30 capsule    Refill:  5    Problem List Items Addressed This Visit    None    Visit Diagnoses    Hiatal hernia with gastroesophageal reflux    -  Primary   Relevant Medications   dexlansoprazole (DEXILANT) 60 MG capsule   Other Relevant Orders   Amb Ref to Medical Weight Management   Overweight       Relevant Orders   Amb Ref to Medical Weight Management   Influenza vaccine administered       Relevant Orders   Flu Vaccine QUAD 6+ mos PF IM (Fluarix Quad PF) (Completed)      Follow-up: Return if symptoms worsen or fail to improve.  [name], DO

## 2018-09-18 DIAGNOSIS — K219 Gastro-esophageal reflux disease without esophagitis: Secondary | ICD-10-CM | POA: Insufficient documentation

## 2018-09-18 DIAGNOSIS — K449 Diaphragmatic hernia without obstruction or gangrene: Principal | ICD-10-CM

## 2018-09-18 NOTE — Assessment & Plan Note (Signed)
PPI ordered  D/w pt diet for gerd Consider GI referral

## 2018-10-01 ENCOUNTER — Telehealth: Payer: Self-pay

## 2018-10-01 MED ORDER — APIXABAN 5 MG PO TABS: 5.0000 mg | ORAL_TABLET | Freq: Two times a day (BID) | ORAL | 5 refills | Status: DC

## 2018-10-01 NOTE — Telephone Encounter (Signed)
Refill sent in

## 2018-10-01 NOTE — Telephone Encounter (Signed)
Pt needs refill on Eliquis. She states she just had an ablation and only have 2 pills left. Pt would like refill sent to [facility] on [address] in [place]. Please address. Thank you.

## 2018-10-10 ENCOUNTER — Encounter (HOSPITAL_COMMUNITY): Payer: Self-pay | Admitting: Nurse Practitioner

## 2018-10-10 ENCOUNTER — Ambulatory Visit (HOSPITAL_COMMUNITY)
Admission: RE | Admit: 2018-10-10 | Discharge: 2018-10-10 | Disposition: A | Discharge status: Home / Self Care | Payer: BC Managed Care – PPO | Source: Ambulatory Visit | Attending: Nurse Practitioner | Admitting: Nurse Practitioner

## 2018-10-10 VITALS — BP 122/68 | HR 62 | Ht 67.0 in | Wt 184.0 lb

## 2018-10-10 DIAGNOSIS — Z79899 Other long term (current) drug therapy: Secondary | ICD-10-CM | POA: Diagnosis not present

## 2018-10-10 DIAGNOSIS — E785 Hyperlipidemia, unspecified: Secondary | ICD-10-CM | POA: Insufficient documentation

## 2018-10-10 DIAGNOSIS — I10 Essential (primary) hypertension: Secondary | ICD-10-CM | POA: Insufficient documentation

## 2018-10-10 DIAGNOSIS — I480 Paroxysmal atrial fibrillation: Secondary | ICD-10-CM | POA: Diagnosis not present

## 2018-10-10 DIAGNOSIS — Z9889 Other specified postprocedural states: Secondary | ICD-10-CM | POA: Diagnosis not present

## 2018-10-10 DIAGNOSIS — Z8673 Personal history of transient ischemic attack (TIA), and cerebral infarction without residual deficits: Secondary | ICD-10-CM | POA: Insufficient documentation

## 2018-10-10 DIAGNOSIS — Z8249 Family history of ischemic heart disease and other diseases of the circulatory system: Secondary | ICD-10-CM | POA: Diagnosis not present

## 2018-10-10 DIAGNOSIS — Z7901 Long term (current) use of anticoagulants: Secondary | ICD-10-CM | POA: Insufficient documentation

## 2018-10-10 DIAGNOSIS — I517 Cardiomegaly: Secondary | ICD-10-CM | POA: Insufficient documentation

## 2018-10-10 DIAGNOSIS — Z91010 Allergy to peanuts: Secondary | ICD-10-CM | POA: Insufficient documentation

## 2018-10-10 DIAGNOSIS — F419 Anxiety disorder, unspecified: Secondary | ICD-10-CM | POA: Diagnosis not present

## 2018-10-10 DIAGNOSIS — Z79890 Hormone replacement therapy (postmenopausal): Secondary | ICD-10-CM | POA: Insufficient documentation

## 2018-10-10 DIAGNOSIS — Z833 Family history of diabetes mellitus: Secondary | ICD-10-CM | POA: Diagnosis not present

## 2018-10-10 NOTE — Progress Notes (Signed)
Primary Care Physician: Carollee Herter, Alferd Apa, DO Referring Physician: Dr. Domingo Sep is a 56 y.o. female with a h/o paroxysmal afib that is in the afib clinic for f/u of ablation 11/4. She has done very well since ablation. Has been staying in [place]. No swallowing or groin issues.   Today, she denies symptoms of palpitations, chest pain, shortness of breath, orthopnea, PND, lower extremity edema, dizziness, presyncope, syncope, or neurologic sequela. The patient is tolerating medications without difficulties and is otherwise without complaint today.   Past Medical History:  Diagnosis Date  . Anxiety   . Graves disease   . Hiatal hernia    a. seen on CT 12/2016.  Marland Kitchen Hyperlipidemia   . Hypertension   . NSVT (nonsustained ventricular tachycardia) ([place])   . Pulmonary nodules    a. seen on CT 12/2016.  . Stroke ([place])   . TIA (transient ischemic attack) 01/2017   a. tx [facility].   Past Surgical History:  Procedure Laterality Date  . ATRIAL FIBRILLATION ABLATION N/A 09/12/2018   Procedure: ATRIAL FIBRILLATION ABLATION;  Surgeon: Thompson Grayer, MD;  Location: [place] [facility];  Service: Cardiovascular;  Laterality: N/A;  . CARPAL TUNNEL RELEASE      Current Outpatient Medications  Medication Sig Dispense Refill  . acetaminophen (TYLENOL) 500 MG tablet Take 500 mg by mouth 2 (two) times daily as needed for moderate pain or headache.    . ALPRAZolam (XANAX) 0.5 MG tablet TAKE 1/2 TO 1 TABLET BY MOUTH TWICE A DAY AS NEEDED (Patient taking differently: Take 0.25-0.5 mg by mouth 2 (two) times daily as needed for anxiety. ) 30 tablet o  . apixaban (ELIQUIS) 5 MG TABS tablet Take 1 tablet (5 mg total) by mouth 2 (two) times daily. 60 tablet 5  . atorvastatin (LIPITOR) 40 MG tablet TAKE 1 TABLET (40 MG TOTAL) BY MOUTH DAILY. (Patient taking differently: Take 40 mg by mouth every evening. ) 90 tablet 3  . dexlansoprazole (DEXILANT) 60 MG capsule Take 1 capsule (60 mg total) by  mouth daily. 30 capsule 5  . diltiazem (CARDIZEM CD) 240 MG 24 hr capsule TAKE 1 CAPSULE BY MOUTH EVERY DAY (Patient taking differently: Take 240 mg by mouth every evening. ) 90 capsule 2  . diltiazem (CARDIZEM) 30 MG tablet Take 1 tablet (30 mg total) by mouth daily as needed. For palpitations/heart rate above 110bpm (Patient taking differently: Take 30 mg by mouth every 6 (six) hours as needed (For palpitations/heart rate above 110bpm). ) 20 tablet 1  . famotidine (PEPCID) 20 MG tablet Take 20 mg by mouth every evening.    . flecainide (TAMBOCOR) 50 MG tablet Take 1 tablet (50 mg total) by mouth 2 (two) times daily. 180 tablet 3  . levothyroxine (SYNTHROID, LEVOTHROID) 112 MCG tablet Take 112 mcg by mouth daily.  6  . oxymetazoline (AFRIN) 0.05 % nasal spray Place 1 spray into both nostrils daily as needed for congestion.    . Tetrahydrozoline HCl (VISINE OP) Place 1 drop into both eyes daily as needed (dry eyes).     No current facility-administered medications for this encounter.     Allergies  Allergen Reactions  . Peanut-Containing Drug Products Swelling    Social History   Socioeconomic History  . Marital status: Divorced    Spouse name: Not on file  . Number of children: Not on file  . Years of education: Not on file  . Highest education level:  Not on file  Occupational History  . Not on file  Social Needs  . Financial resource strain: Not on file  . Food insecurity:    Worry: Not on file    Inability: Not on file  . Transportation needs:    Medical: Not on file    Non-medical: Not on file  Tobacco Use  . Smoking status: Never Smoker  . Smokeless tobacco: Never Used  Substance and Sexual Activity  . Alcohol use: Yes    Alcohol/week: 1.0 standard drinks    Types: 1 Glasses of wine per week    Comment: occasionally  . Drug use: No  . Sexual activity: Yes  Lifestyle  . Physical activity:    Days per week: Not on file    Minutes per session: Not on file  . Stress:  Not on file  Relationships  . Social connections:    Talks on phone: Not on file    Gets together: Not on file    Attends religious service: Not on file    Active member of club or organization: Not on file    Attends meetings of clubs or organizations: Not on file    Relationship status: Not on file  . Intimate partner violence:    Fear of current or ex partner: Not on file    Emotionally abused: Not on file    Physically abused: Not on file    Forced sexual activity: Not on file  Other Topics Concern  . 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 Coronary artery disease Father   . Atrial fibrillation Father   . Diabetes Maternal Grandmother   . Hyperlipidemia Mother   . Dementia Mother   . Coronary artery disease Paternal Grandfather   . Coronary artery disease Paternal Uncle     ROS- All systems are reviewed and negative except as per the HPI above  Physical Exam: Vitals:   10/10/18 1539  BP: 122/68  Pulse: 62  Weight: 83.5 kg  Height: 5\' 7"  (1.702 m)   Wt Readings from Last 3 Encounters:  10/10/18 83.5 kg  09/16/18 85.1 kg  09/12/18 81.6 kg    Labs: Lab Results  Component Value Date   NA 142 09/05/2018   K 3.9 09/05/2018   CL 103 09/05/2018   CO2 24 09/05/2018   GLUCOSE 86 09/05/2018   BUN 16 09/05/2018   CREATININE 0.75 09/05/2018   CALCIUM 9.1 09/05/2018   No results found for: INR Lab Results  Component Value Date   CHOL 169 11/27/2011   HDL 36.20 (L) 11/27/2011   LDLCALC 97 11/27/2011   TRIG 180.0 (H) 11/27/2011     GEN- The patient is well appearing, alert and oriented x 3 today.   Head- normocephalic, atraumatic Eyes-  Sclera clear, conjunctiva pink Ears- hearing intact Oropharynx- clear Neck- supple, no JVP Lymph- no cervical lymphadenopathy Lungs- Clear to ausculation bilaterally, normal work of breathing Heart- Regular rate and  rhythm, no murmurs, rubs or gallops, PMI not laterally displaced GI- soft, NT, ND, + BS Extremities- no clubbing, cyanosis, or edema MS- no significant deformity or atrophy Skin- no rash or lesion Psych- euthymic mood, full affect Neuro- strength and sensation are intact  EKG- Sinus brady at 62 bpm, Pr int 196 ms, qrs int 96 ms, qtc 434 ms Epic records reviewed    Assessment  and Plan: 1. Paroxysmal afib  S/p ablation and is doing well maintaining SR Continue flecainide 50 mg bid  Continue Cardizem 240 mg qd Continue apixaban 5 mg bid for a chadsvasc score of 4  F/u with Dr. Rayann Heman 12/16/18   Geroge Baseman. Weslie Rasmus, [facility] [address] [place], [place] [address] [phone]

## 2018-12-02 ENCOUNTER — Telehealth (HOSPITAL_COMMUNITY): Payer: Self-pay | Admitting: *Deleted

## 2018-12-02 MED ORDER — DILTIAZEM HCL 30 MG PO TABS: 30.0000 mg | ORAL_TABLET | Freq: Every day | ORAL | 2 refills | Status: DC | PRN

## 2018-12-02 MED ORDER — FLECAINIDE ACETATE 50 MG PO TABS: 100.0000 mg | ORAL_TABLET | Freq: Two times a day (BID) | ORAL | 3 refills | Status: DC

## 2018-12-02 NOTE — Telephone Encounter (Signed)
Patient called in stating she has been in AF since Friday afternoon been taking her 30mg  cardizem as needed for elevated HR but its usually not persistent this long. Discussed with Roderic Palau NP - will increase flecainide to 100mg  BID and follow up Wednesday. Pt verbalizes understanding.

## 2018-12-04 ENCOUNTER — Ambulatory Visit (HOSPITAL_COMMUNITY)
Admission: RE | Admit: 2018-12-04 | Discharge: 2018-12-04 | Disposition: A | Discharge status: Home / Self Care | Payer: BC Managed Care – PPO | Source: Ambulatory Visit | Attending: Nurse Practitioner | Admitting: Nurse Practitioner

## 2018-12-04 DIAGNOSIS — Z79899 Other long term (current) drug therapy: Secondary | ICD-10-CM | POA: Insufficient documentation

## 2018-12-04 DIAGNOSIS — I4891 Unspecified atrial fibrillation: Secondary | ICD-10-CM | POA: Insufficient documentation

## 2018-12-04 DIAGNOSIS — R9431 Abnormal electrocardiogram [ECG] [EKG]: Secondary | ICD-10-CM | POA: Insufficient documentation

## 2018-12-04 NOTE — Progress Notes (Addendum)
Pt in for EKG and BP.  To be reviewed by Roderic Palau, NP  Pt called last week and had a episode of afib that lasted 3 days. Flecainide was increased to 100 mg bid. EKG today  shows SR with normal intervals. She has not had any further afib but notices a few palpitations at times, possible premature contractures. She is scheduled to see Dr. Rayann Heman 2/10 for 3 month f/u ablation . She will stay on flecainide 100 mg bid until then.

## 2018-12-16 ENCOUNTER — Encounter: Payer: Self-pay | Admitting: Internal Medicine

## 2018-12-16 ENCOUNTER — Ambulatory Visit (INDEPENDENT_AMBULATORY_CARE_PROVIDER_SITE_OTHER): Payer: BC Managed Care – PPO | Admitting: Internal Medicine

## 2018-12-16 VITALS — BP 126/72 | HR 62 | Ht 67.0 in | Wt 183.8 lb

## 2018-12-16 DIAGNOSIS — I480 Paroxysmal atrial fibrillation: Secondary | ICD-10-CM

## 2018-12-16 NOTE — Progress Notes (Signed)
PCP: [name], DO  Primary EP: Dr Domingo Sep is a 58 y.o. female who presents today for routine electrophysiology followup.  Since last being seen in our clinic, the patient reports doing very well. She did have an episode of afib two weeks ago which she believes lasted for 3 days. Her flecainide was increased by Roderic Palau and on ECG follow up she was in SR. She has done well since then with no symptoms. No procedure related complications.   Today, she denies symptoms of palpitations, chest pain, shortness of breath,  lower extremity edema, dizziness, presyncope, or syncope.  The patient is otherwise without complaint today.   Past Medical History:  Diagnosis Date  . Anxiety   . Graves disease   . Hiatal hernia    a. seen on CT 12/2016.  Marland Kitchen Hyperlipidemia   . Hypertension   . NSVT (nonsustained ventricular tachycardia) ([place])   . Pulmonary nodules    a. seen on CT 12/2016.  . Stroke ([place])   . TIA (transient ischemic attack) 01/2017   a. tx at [facility].   Past Surgical History:  Procedure Laterality Date  . ATRIAL FIBRILLATION ABLATION N/A 09/12/2018   Procedure: ATRIAL FIBRILLATION ABLATION;  Surgeon: Thompson Grayer, MD;  Location: [facility];  Service: Cardiovascular;  Laterality: N/A;  . CARPAL TUNNEL RELEASE      ROS- all systems are reviewed and negatives except as per HPI above  Current Outpatient Medications  Medication Sig Dispense Refill  . acetaminophen (TYLENOL) 500 MG tablet Take 500 mg by mouth 2 (two) times daily as needed for moderate pain or headache.    . ALPRAZolam (XANAX) 0.5 MG tablet TAKE 1/2 TO 1 TABLET BY MOUTH TWICE A DAY AS NEEDED (Patient taking differently: Take 0.25-0.5 mg by mouth 2 (two) times daily as needed for anxiety. ) 30 tablet o  . apixaban (ELIQUIS) 5 MG TABS tablet Take 1 tablet (5 mg total) by mouth 2 (two) times daily. 60 tablet 5  . atorvastatin (LIPITOR) 40 MG tablet TAKE 1 TABLET (40 MG TOTAL) BY MOUTH  DAILY. (Patient taking differently: Take 40 mg by mouth every evening. ) 90 tablet 3  . dexlansoprazole (DEXILANT) 60 MG capsule Take 1 capsule (60 mg total) by mouth daily. 30 capsule 5  . diltiazem (CARDIZEM CD) 240 MG 24 hr capsule TAKE 1 CAPSULE BY MOUTH EVERY DAY (Patient taking differently: Take 240 mg by mouth every evening. ) 90 capsule 2  . diltiazem (CARDIZEM) 30 MG tablet Take 1 tablet (30 mg total) by mouth daily as needed. For palpitations/heart rate above 110bpm 45 tablet 2  . famotidine (PEPCID) 20 MG tablet Take 20 mg by mouth every evening.    . flecainide (TAMBOCOR) 50 MG tablet Take 2 tablets (100 mg total) by mouth 2 (two) times daily. 180 tablet 3  . levothyroxine (SYNTHROID, LEVOTHROID) 112 MCG tablet Take 112 mcg by mouth daily.  6  . oxymetazoline (AFRIN) 0.05 % nasal spray Place 1 spray into both nostrils daily as needed for congestion.    . Tetrahydrozoline HCl (VISINE OP) Place 1 drop into both eyes daily as needed (dry eyes).     No current facility-administered medications for this visit.     Physical Exam: Vitals:   12/16/18 1633  BP: 126/72  Pulse: 62  SpO2: 96%  Weight: 183 lb 12.8 oz (83.4 kg)  Height: 5\' 7"  (1.702 m)    GEN- The patient  is well appearing, alert and oriented x 3 today.   Head- normocephalic, atraumatic Eyes-  Sclera clear, conjunctiva pink Ears- hearing intact Oropharynx- clear Lungs- Clear to ausculation bilaterally, normal work of breathing Heart- Regular rate and rhythm, no murmurs, rubs or gallops, PMI not laterally displaced GI- soft, NT, ND, + BS Extremities- no clubbing, cyanosis, or edema  Wt Readings from Last 3 Encounters:  12/16/18 183 lb 12.8 oz (83.4 kg)  10/10/18 184 lb (83.5 kg)  09/16/18 187 lb 9.6 oz (85.1 kg)    EKG tracing ordered today is personally reviewed and shows sinus bradycardia HR 53, PR 202, QRS 92, QTc 418  Assessment and Plan:  1. Paroxysmal atrial fibrillation S/p ablation  09/12/2018. Maintaining SR on flecainide 100 mg BID Patient would like to remain on flecainide and diltiazem at present doses until her next follow up.  Continue Eliquis 5 mg BID  This patients CHA2DS2-VASc Score and unadjusted Ischemic Stroke Rate (% per year) is equal to 4.8 % stroke rate/year from a score of 4  Above score calculated as 1 point each if present [CHF, HTN, DM, Vascular=MI/PAD/Aortic Plaque, Age if 65-74, or Female] Above score calculated as 2 points each if present [Age > 75, or Stroke/TIA/TE]  2. HTN Stable, no changes today.  Follow up with me in 3 months.  Thompson Grayer MD, [facility] 12/16/2018 5:18 PM

## 2018-12-16 NOTE — Patient Instructions (Addendum)
Medication Instructions:  Your physician recommends that you continue on your current medications as directed. Please refer to the Current Medication list given to you today.  Labwork: None ordered.  Testing/Procedures: None ordered.  Follow-Up: Your physician wants you to follow-up in: 3 months with Dr. Allred.      Any Other Special Instructions Will Be Listed Below (If Applicable).  If you need a refill on your cardiac medications before your next appointment, please call your pharmacy.   

## 2019-01-03 ENCOUNTER — Other Ambulatory Visit (HOSPITAL_COMMUNITY): Payer: Self-pay | Admitting: *Deleted

## 2019-01-03 MED ORDER — FLECAINIDE ACETATE 100 MG PO TABS: 100.0000 mg | ORAL_TABLET | Freq: Two times a day (BID) | ORAL | 1 refills | Status: DC

## 2019-01-17 ENCOUNTER — Telehealth: Payer: Self-pay | Admitting: Internal Medicine

## 2019-01-17 NOTE — Telephone Encounter (Signed)
Patient has a cold and she would like to know what OTC cold medication she can take.

## 2019-02-12 ENCOUNTER — Telehealth: Payer: Self-pay

## 2019-02-12 ENCOUNTER — Telehealth (INDEPENDENT_AMBULATORY_CARE_PROVIDER_SITE_OTHER): Payer: BC Managed Care – PPO | Admitting: Internal Medicine

## 2019-02-12 VITALS — HR 62

## 2019-02-12 DIAGNOSIS — I480 Paroxysmal atrial fibrillation: Secondary | ICD-10-CM | POA: Diagnosis not present

## 2019-02-12 DIAGNOSIS — I10 Essential (primary) hypertension: Secondary | ICD-10-CM

## 2019-02-12 NOTE — Progress Notes (Signed)
Electrophysiology TeleHealth Note   Due to national recommendations of social distancing due to COVID 19, an audio/video telehealth visit is felt to be most appropriate for this patient at this time.  See MyChart message from today for the patient's consent to telehealth for [facility] Dr Alexandria [name].   Date:  02/12/2019   ID:  Alexandria [name], DOB [birth year], MRN [id number]  Location: patient's home  Provider location: [address] [place]  Evaluation Performed: Follow-up visit  PCP:  [name], DO  Electrophysiologist:  Dr Rayann Heman  Chief Complaint:  afib  History of Present Illness:    Alexandria [name] is a 58 y.o. female who presents via audio/video conferencing for a telehealth visit today.  Since last being seen in our clinic, the patient reports doing very well. No afib since her last visit! Today, she denies symptoms of palpitations, chest pain, shortness of breath,  lower extremity edema, dizziness, presyncope, or syncope.  The patient is otherwise without complaint today.  The patient denies symptoms of fevers, chills, cough, or new SOB worrisome for COVID 19.  Past Medical History:  Diagnosis Date  . Anxiety   . Graves disease   . Hiatal hernia    a. seen on CT 12/2016.  Marland Kitchen Hyperlipidemia   . Hypertension   . NSVT (nonsustained ventricular tachycardia) ([place])   . Pulmonary nodules    a. seen on CT 12/2016.  . Stroke ([place])   . TIA (transient ischemic attack) 01/2017   a. tx at [facility].    Past Surgical History:  Procedure Laterality Date  . ATRIAL FIBRILLATION ABLATION N/A 09/12/2018   Procedure: ATRIAL FIBRILLATION ABLATION;  Surgeon: Thompson Grayer, MD;  Location: [facility];  Service: Cardiovascular;  Laterality: N/A;  . CARPAL TUNNEL RELEASE      Current Outpatient Medications  Medication Sig Dispense Refill  . acetaminophen (TYLENOL) 500 MG tablet Take 500 mg by mouth 2 (two) times daily as needed for moderate pain or headache.    .  ALPRAZolam (XANAX) 0.5 MG tablet TAKE 1/2 TO 1 TABLET BY MOUTH TWICE A DAY AS NEEDED (Patient taking differently: Take 0.25-0.5 mg by mouth 2 (two) times daily as needed for anxiety. ) 30 tablet o  . apixaban (ELIQUIS) 5 MG TABS tablet Take 1 tablet (5 mg total) by mouth 2 (two) times daily. 60 tablet 5  . atorvastatin (LIPITOR) 40 MG tablet TAKE 1 TABLET (40 MG TOTAL) BY MOUTH DAILY. (Patient taking differently: Take 40 mg by mouth every evening. ) 90 tablet 3  . dexlansoprazole (DEXILANT) 60 MG capsule Take 1 capsule (60 mg total) by mouth daily. 30 capsule 5  . diltiazem (CARDIZEM CD) 240 MG 24 hr capsule TAKE 1 CAPSULE BY MOUTH EVERY DAY (Patient taking differently: Take 240 mg by mouth every evening. ) 90 capsule 2  . diltiazem (CARDIZEM) 30 MG tablet Take 1 tablet (30 mg total) by mouth daily as needed. For palpitations/heart rate above 110bpm 45 tablet 2  . famotidine (PEPCID) 20 MG tablet Take 20 mg by mouth every evening.    . flecainide (TAMBOCOR) 100 MG tablet Take 1 tablet (100 mg total) by mouth 2 (two) times daily. 180 tablet 1  . levothyroxine (SYNTHROID, LEVOTHROID) 112 MCG tablet Take 112 mcg by mouth daily.  6  . oxymetazoline (AFRIN) 0.05 % nasal spray Place 1 spray into both nostrils daily as needed for congestion.    . Tetrahydrozoline HCl (VISINE OP) Place  1 drop into both eyes daily as needed (dry eyes).     No current facility-administered medications for this visit.     Allergies:   Peanut-containing drug products   Social History:  The patient  reports that she has never smoked. She has never used smokeless tobacco. She reports current alcohol use of about 1.0 standard drinks of alcohol per week. She reports that she does not use drugs.   Family History:  The patient's  family history includes Atrial fibrillation in her father; Coronary artery disease in her father, paternal grandfather, and paternal uncle; Dementia in her mother; Diabetes in her maternal grandmother;  Hyperlipidemia in her mother.   ROS:  Please see the history of present illness.   All other systems are personally reviewed and negative.    Exam:    Vital Signs:  Pulse 62   LMP 01/26/2012   Well appearing, alert and conversant, regular work of breathing,  good skin color Eyes- anicteric, neuro- grossly intact, skin- no apparent rash or lesions or cyanosis, mouth- oral mucosa is pink   Labs/Other Tests and Data Reviewed:    Recent Labs: 04/05/2018: B Natriuretic Peptide 120.4 04/22/2018: ALT 17; TSH 0.65 09/05/2018: BUN 16; Creatinine, Ser 0.75; Hemoglobin 12.7; Platelets 308; Potassium 3.9; Sodium 142   Wt Readings from Last 3 Encounters:  12/16/18 183 lb 12.8 oz (83.4 kg)  10/10/18 184 lb (83.5 kg)  09/16/18 187 lb 9.6 oz (85.1 kg)     Other studies personally reviewed: Additional studies/ records that were reviewed today include: my prior notes  Review of the above records today demonstrates: as above Prior radiographs: cardiac CT 09/05/18- calcium score is 0  The patient presents wearable device technology report for my review today. On my review, the patient presents with Kardia tracings from today . The tracings reveal sinus rhythm   ASSESSMENT & PLAN:    1.  Paroxysmal atrial fibrillation Well controlled pos ablation Though I would like to stop her flecainide/ diltiazem, I am reluctant to do so in the setting of COVID 19.   She will reduce flecainide to 50mg  BID after COVID 19 is over.  Consider stopping flecainide upon return chads2vasc score is 4.  She is on eliquis  2. HTN Stable No change required today  3. COVID 19 screen The patient denies symptoms of COVID 19 at this time.  The importance of social distancing was discussed today.  Follow-up:  Return to see me in 4 months  Current medicines are reviewed at length with the patient today.   The patient does not have concerns regarding her medicines.  The following changes were made today:  none   Labs/ tests ordered today include:  No orders of the defined types were placed in this encounter.   Patient Risk:  after full review of this patients clinical status, I feel that they are at moderate risk at this time.  Today, I have spent 20 minutes with the patient with telehealth technology discussing afib.    Army Fossa, MD  02/12/2019 4:16 PM     [place] [address] [phone] (office) [phone] (fax)

## 2019-02-12 NOTE — Telephone Encounter (Signed)
Spoke with pt regarding appt on 02/12/19. Pt stated she is unable to check vitals, but will upload an EKG. Pt questions and concerns were address

## 2019-02-12 NOTE — Telephone Encounter (Signed)
  Patient needs direction on how to upload program for her visit this afternoon.

## 2019-02-21 ENCOUNTER — Ambulatory Visit: Payer: BC Managed Care – PPO | Admitting: Internal Medicine

## 2019-02-24 ENCOUNTER — Other Ambulatory Visit (HOSPITAL_COMMUNITY): Payer: Self-pay | Admitting: Nurse Practitioner

## 2019-03-05 ENCOUNTER — Encounter: Payer: Self-pay | Admitting: *Deleted

## 2019-03-05 ENCOUNTER — Other Ambulatory Visit: Payer: Self-pay | Admitting: *Deleted

## 2019-03-05 DIAGNOSIS — K219 Gastro-esophageal reflux disease without esophagitis: Secondary | ICD-10-CM

## 2019-03-05 DIAGNOSIS — K449 Diaphragmatic hernia without obstruction or gangrene: Principal | ICD-10-CM

## 2019-03-05 MED ORDER — ATORVASTATIN CALCIUM 40 MG PO TABS: 40.0000 mg | ORAL_TABLET | Freq: Every evening | ORAL | 0 refills | Status: DC

## 2019-03-05 MED ORDER — DEXLANSOPRAZOLE 60 MG PO CPDR: 60.0000 mg | DELAYED_RELEASE_CAPSULE | Freq: Every day | ORAL | 0 refills | Status: DC

## 2019-03-05 NOTE — Addendum Note (Signed)
Addended by: Kem Boroughs D on: 03/05/2019 01:13 PM   Modules accepted: Orders

## 2019-03-21 ENCOUNTER — Other Ambulatory Visit: Payer: Self-pay | Admitting: Internal Medicine

## 2019-03-21 NOTE — Telephone Encounter (Signed)
Eliquis 5mg  refill request received; pt is 58 yrs old, wt-83.4kg, Crea-0.75 on 09/05/2018, last seen by Dr. Rayann Heman virtually on 02/12/2019 and pending follow-up 06/2019; refill sent.

## 2019-04-01 ENCOUNTER — Other Ambulatory Visit: Payer: Self-pay | Admitting: Family Medicine

## 2019-04-01 DIAGNOSIS — K219 Gastro-esophageal reflux disease without esophagitis: Secondary | ICD-10-CM

## 2019-04-01 DIAGNOSIS — K449 Diaphragmatic hernia without obstruction or gangrene: Secondary | ICD-10-CM

## 2019-04-02 ENCOUNTER — Other Ambulatory Visit (HOSPITAL_COMMUNITY): Payer: Self-pay | Admitting: Nurse Practitioner

## 2019-04-25 ENCOUNTER — Other Ambulatory Visit: Payer: Self-pay | Admitting: Family Medicine

## 2019-04-25 DIAGNOSIS — K219 Gastro-esophageal reflux disease without esophagitis: Secondary | ICD-10-CM

## 2019-04-25 DIAGNOSIS — K449 Diaphragmatic hernia without obstruction or gangrene: Secondary | ICD-10-CM

## 2019-05-18 ENCOUNTER — Other Ambulatory Visit: Payer: Self-pay | Admitting: Family Medicine

## 2019-05-18 DIAGNOSIS — K219 Gastro-esophageal reflux disease without esophagitis: Secondary | ICD-10-CM

## 2019-05-18 DIAGNOSIS — K449 Diaphragmatic hernia without obstruction or gangrene: Secondary | ICD-10-CM

## 2019-05-27 ENCOUNTER — Other Ambulatory Visit: Payer: Self-pay | Admitting: Family Medicine

## 2019-06-14 ENCOUNTER — Other Ambulatory Visit: Payer: Self-pay | Admitting: Family Medicine

## 2019-06-14 DIAGNOSIS — K219 Gastro-esophageal reflux disease without esophagitis: Secondary | ICD-10-CM

## 2019-06-18 ENCOUNTER — Ambulatory Visit (INDEPENDENT_AMBULATORY_CARE_PROVIDER_SITE_OTHER): Payer: BC Managed Care – PPO | Admitting: Internal Medicine

## 2019-06-18 ENCOUNTER — Encounter: Payer: Self-pay | Admitting: Internal Medicine

## 2019-06-18 ENCOUNTER — Other Ambulatory Visit: Payer: Self-pay

## 2019-06-18 VITALS — BP 110/76 | HR 55 | Ht 67.0 in | Wt 180.0 lb

## 2019-06-18 DIAGNOSIS — I10 Essential (primary) hypertension: Secondary | ICD-10-CM | POA: Diagnosis not present

## 2019-06-18 DIAGNOSIS — I480 Paroxysmal atrial fibrillation: Secondary | ICD-10-CM | POA: Diagnosis not present

## 2019-06-18 MED ORDER — FLECAINIDE ACETATE 100 MG PO TABS: 100.0000 mg | ORAL_TABLET | Freq: Two times a day (BID) | ORAL | 6 refills | Status: DC

## 2019-06-18 NOTE — Progress Notes (Signed)
PCP: [name], DO   Primary EP: Dr Rayann Heman  CC: afib  Alexandria [name] is a 58 y.o. female who presents today for routine electrophysiology followup.  Since last being seen in our clinic, the patient reports doing reasonably well.  She has a lot of family stress related to her son's mental health.  She reports that after reducing flecainide to 50mg  BID that she had afib 05/26/2019 (confirmed on review of her Jodelle Red tracing today).  Today, she denies symptoms of  chest pain, shortness of breath,  lower extremity edema, dizziness, presyncope, or syncope.  The patient is otherwise without complaint today.   Past Medical History:  Diagnosis Date  . Anxiety   . Graves disease   . Hiatal hernia    a. seen on CT 12/2016.  Marland Kitchen Hyperlipidemia   . Hypertension   . NSVT (nonsustained ventricular tachycardia) ([place])   . Pulmonary nodules    a. seen on CT 12/2016.  . Stroke ([place])   . TIA (transient ischemic attack) 01/2017   a. tx at [facility].   Past Surgical History:  Procedure Laterality Date  . ATRIAL FIBRILLATION ABLATION N/A 09/12/2018   Procedure: ATRIAL FIBRILLATION ABLATION;  Surgeon: Thompson Grayer, MD;  Location: [facility];  Service: Cardiovascular;  Laterality: N/A;  . CARPAL TUNNEL RELEASE      ROS- all systems are reviewed and negatives except as per HPI above  Current Outpatient Medications  Medication Sig Dispense Refill  . acetaminophen (TYLENOL) 500 MG tablet Take 500 mg by mouth 2 (two) times daily as needed for moderate pain or headache.    . ALPRAZolam (XANAX) 0.5 MG tablet TAKE 1/2 TO 1 TABLET BY MOUTH TWICE A DAY AS NEEDED (Patient taking differently: Take 0.25-0.5 mg by mouth 2 (two) times daily as needed for anxiety. ) 30 tablet o  . atorvastatin (LIPITOR) 40 MG tablet TAKE 1 TABLET BY MOUTH EVERY DAY IN THE EVENING 30 tablet 1  . DEXILANT 60 MG capsule TAKE 1 CAPSULE BY MOUTH EVERY DAY 30 capsule 2  . diltiazem (CARDIZEM CD) 240 MG 24 hr capsule TAKE  1 CAPSULE BY MOUTH EVERY DAY 90 capsule 2  . diltiazem (CARDIZEM) 30 MG tablet Take 1 tablet (30 mg total) by mouth daily as needed. For palpitations/heart rate above 110bpm 90 tablet 1  . ELIQUIS 5 MG TABS tablet TAKE 1 TABLET BY MOUTH TWICE A DAY 60 tablet 5  . famotidine (PEPCID) 20 MG tablet Take 20 mg by mouth every evening.    . flecainide (TAMBOCOR) 100 MG tablet Take 50 mg by mouth 2 (two) times daily.    Marland Kitchen levothyroxine (SYNTHROID, LEVOTHROID) 112 MCG tablet Take 112 mcg by mouth daily.  6  . oxymetazoline (AFRIN) 0.05 % nasal spray Place 1 spray into both nostrils daily as needed for congestion.    . Tetrahydrozoline HCl (VISINE OP) Place 1 drop into both eyes daily as needed (dry eyes).     No current facility-administered medications for this visit.     Physical Exam: Vitals:   06/18/19 1547  BP: 110/76  Pulse: (!) 55  SpO2: 94%  Weight: 180 lb (81.6 kg)  Height: 5\' 7"  (1.702 m)    GEN- The patient is well appearing, alert and oriented x 3 today.   Head- normocephalic, atraumatic Eyes-  Sclera clear, conjunctiva pink Ears- hearing intact Oropharynx- clear Lungs- Clear to ausculation bilaterally, normal work of breathing Heart- Regular rate and rhythm, no  murmurs, rubs or gallops, PMI not laterally displaced GI- soft, NT, ND, + BS Extremities- no clubbing, cyanosis, or edema  Wt Readings from Last 3 Encounters:  06/18/19 180 lb (81.6 kg)  12/16/18 183 lb 12.8 oz (83.4 kg)  10/10/18 184 lb (83.5 kg)    EKG tracing ordered today is personally reviewed and shows sinus bradycardia 55 bpm, PR 188 msec, QRS 90 msec, otherwise normal ekg  Kardia tracing 05/26/19- afib  Assessment and Plan:  1. Paroxysmal atrial fibrillation She did have a recent breakthrough AF episode, likely due to increased stress increase flecainide to 100mg  BID On eliquis for chads2vasc score of 4  2. HTN Stable No change required today  Return to see me in 6 months  Thompson Grayer MD,  [facility] 06/18/2019 4:11 PM

## 2019-06-18 NOTE — Patient Instructions (Addendum)
Medication Instructions:  Your physician has recommended you make the following change in your medication:  1. INCREASE Flecainide to 100 mg twice daily  * If you need a refill on your cardiac medications before your next appointment, please call your pharmacy.   Labwork: None ordered  Testing/Procedures: None ordered  Follow-Up: Your physician wants you to follow-up in: 6 months with Dr. Rayann Heman.  You will receive a reminder letter in the mail two months in advance. If you don't receive a letter, please call our office to schedule the follow-up appointment.  Thank you for choosing [facility]!!

## 2019-06-23 ENCOUNTER — Other Ambulatory Visit: Payer: Self-pay | Admitting: Family Medicine

## 2019-06-27 IMAGING — CR DG CHEST 2V
2 series · 2 of 2 positions shown · non-contrast
Comparison: CT images of the chest performed 01/02/2017

CLINICAL DATA: Acute onset of palpitations. Lightheadedness.

EXAM:
CHEST - 2 VIEW

[w chest pa]
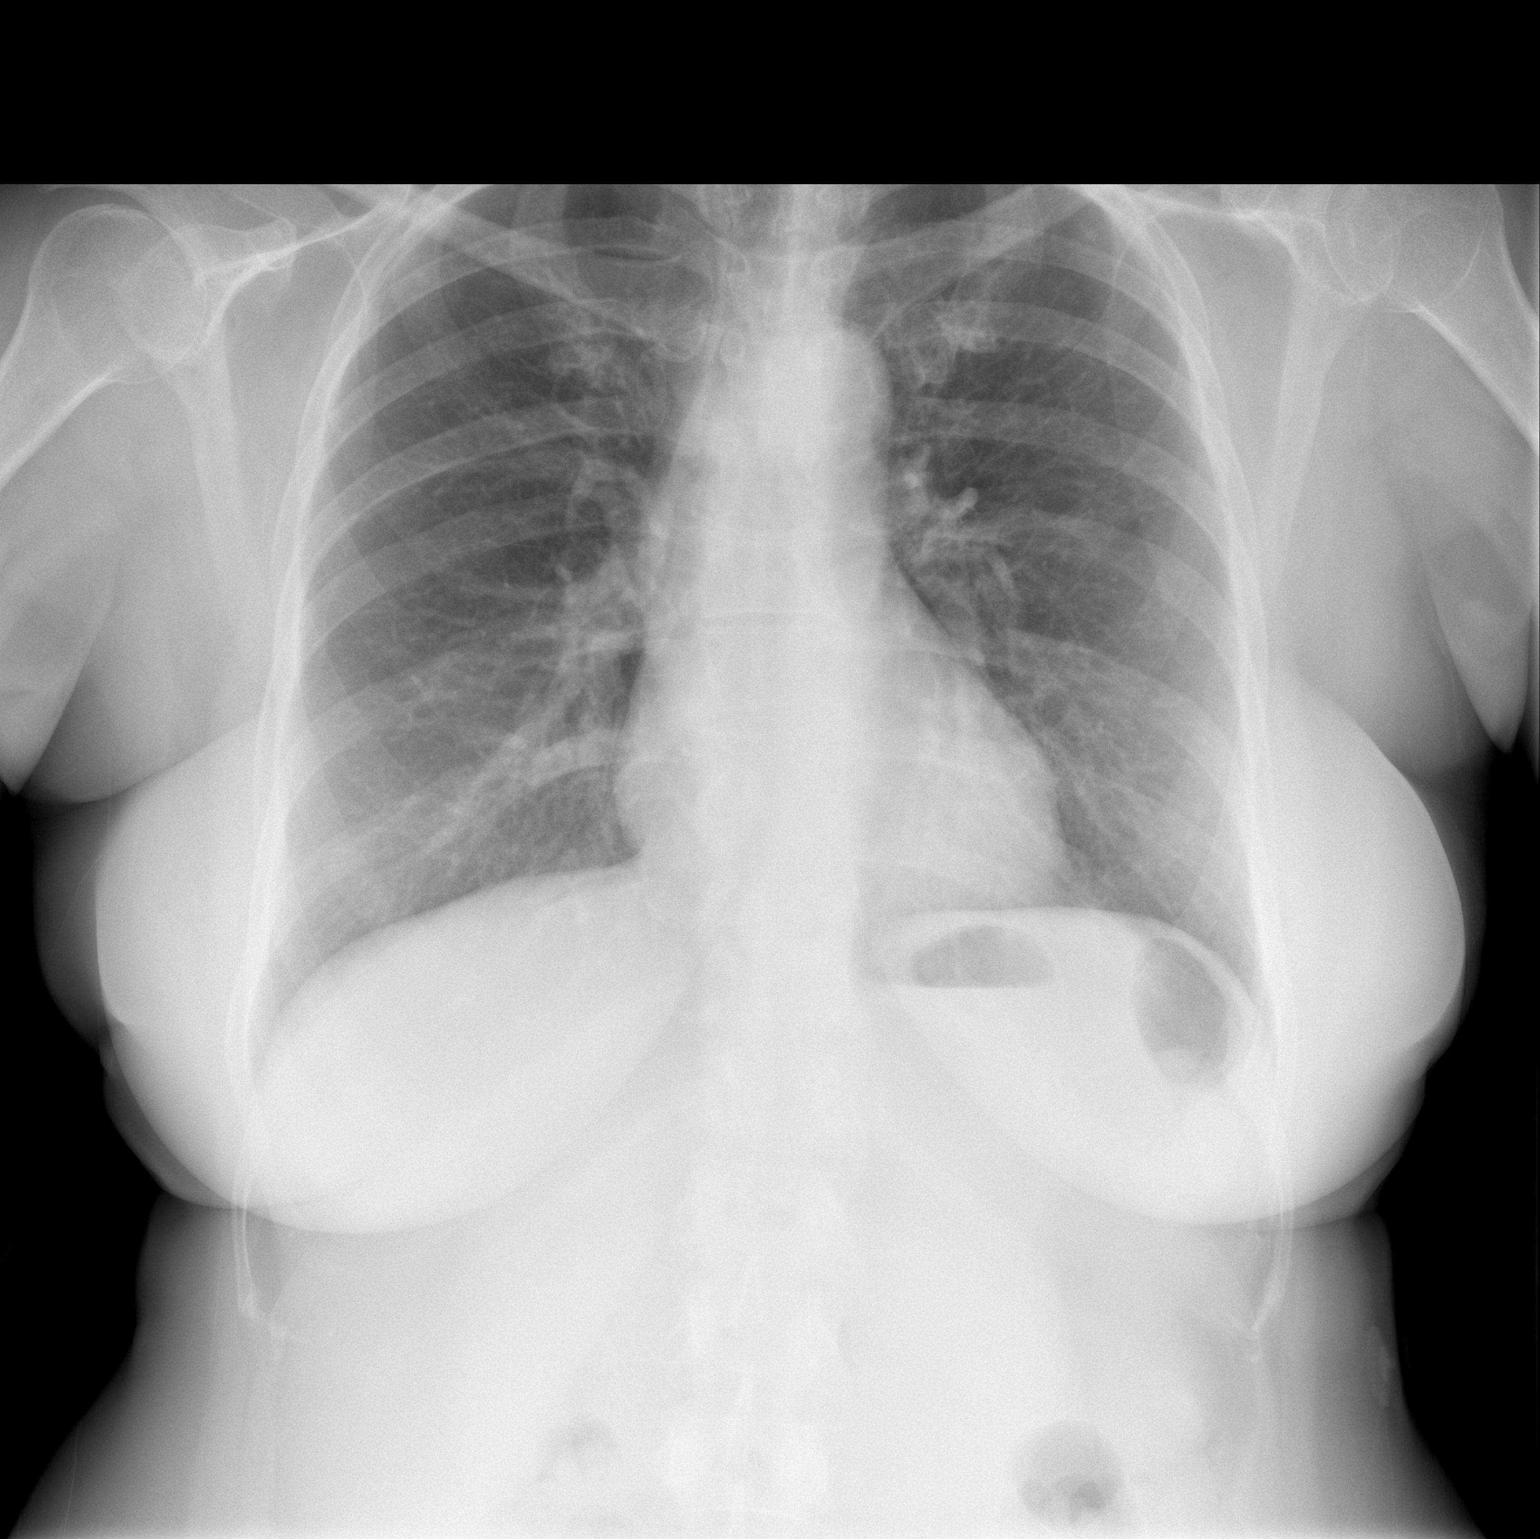

[w chest lat]
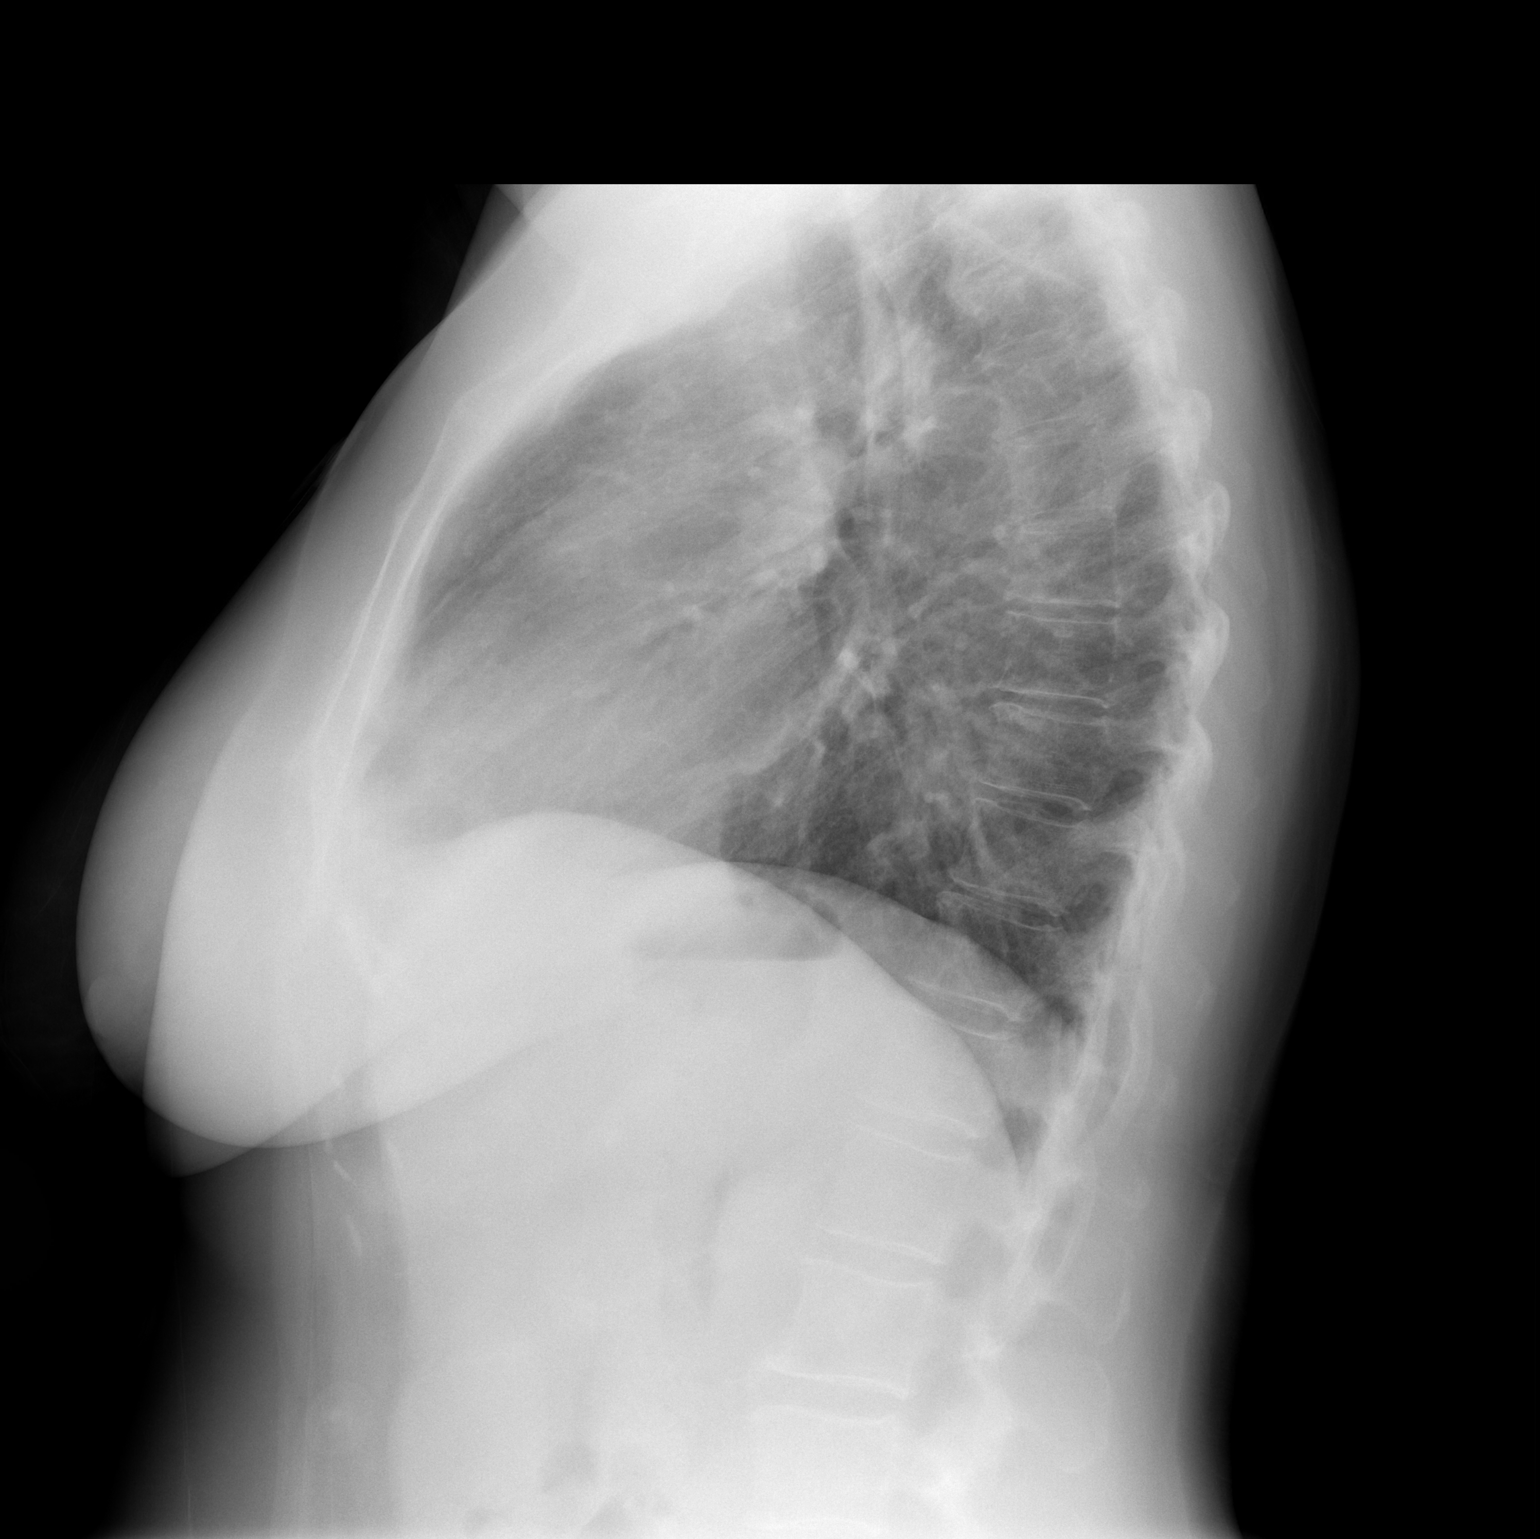

[2 of 2 positions shown; findings below may reference images not displayed]

FINDINGS: The lungs are well-aerated. Mild peribronchial thickening is noted.
There is no evidence of focal opacification, pleural effusion or
pneumothorax.

The heart is normal in size; the mediastinal contour is within
normal limits. No acute osseous abnormalities are seen.
IMPRESSION: Mild peribronchial thickening noted; lungs otherwise clear.

## 2019-07-17 ENCOUNTER — Other Ambulatory Visit: Payer: Self-pay | Admitting: Family Medicine

## 2019-07-30 ENCOUNTER — Encounter: Payer: Self-pay | Admitting: Gastroenterology

## 2019-07-30 ENCOUNTER — Other Ambulatory Visit: Payer: Self-pay

## 2019-07-30 ENCOUNTER — Encounter: Payer: Self-pay | Admitting: Family Medicine

## 2019-07-30 ENCOUNTER — Ambulatory Visit (INDEPENDENT_AMBULATORY_CARE_PROVIDER_SITE_OTHER): Payer: BC Managed Care – PPO | Admitting: Family Medicine

## 2019-07-30 DIAGNOSIS — K219 Gastro-esophageal reflux disease without esophagitis: Secondary | ICD-10-CM | POA: Diagnosis not present

## 2019-07-30 DIAGNOSIS — R002 Palpitations: Secondary | ICD-10-CM

## 2019-07-30 DIAGNOSIS — K449 Diaphragmatic hernia without obstruction or gangrene: Secondary | ICD-10-CM | POA: Diagnosis not present

## 2019-07-30 MED ORDER — ALPRAZOLAM 0.5 MG PO TABS: ORAL_TABLET | ORAL | 1 refills | Status: DC

## 2019-07-30 NOTE — Progress Notes (Signed)
Virtual Visit via Video Note  I connected with Alexandria [name] on 07/30/19 at 10:40 AM EDT by a video enabled telemedicine application and verified that I am speaking with the correct person using two identifiers.  Location: Patient: home Provider: home    I discussed the limitations of evaluation and management by telemedicine and the availability of in person appointments. The patient expressed understanding and agreed to proceed.  History of Present Illness: Pt is home-- she is struggling with her gerd - she is taking her dexilant and pepcid but is not helping any longer.  Pt is requesting surgery to fix hiatal hernia Pt has never seen GI or had egd Pt is also having a lot of stress / anxiety----she is seeing a counselor and does not want more medication at this time     Observations/Objective: No vitals obtained  Pt in NAd  Pt is very stressed and anxious----she does not want meds at this time   Assessment and Plan: 1. Hiatal hernia with gastroesophageal reflux disease without esophagitis Pt is requesting surgery---- will have her see GI first -- she has never had egd Pt will con't dexilant and pepcid  - Ambulatory referral to Gastroenterology  2. Palpitations And anxiety----  con't counseling and prn xanax --- she takes it very rarely - ALPRAZolam (XANAX) 0.5 MG tablet; TAKE 1/2 TO 1 TABLET BY MOUTH TWICE A DAY AS NEEDED  Dispense: 30 tablet; Refill: 1  Follow Up Instructions:    I discussed the assessment and treatment plan with the patient. The patient was provided an opportunity to ask questions and all were answered. The patient agreed with the plan and demonstrated an understanding of the instructions.   The patient was advised to call back or seek an in-person evaluation if the symptoms worsen or if the condition fails to improve as anticipated.  I provided 15 minutes of non-face-to-face time during this encounter.   [name], DO

## 2019-08-12 ENCOUNTER — Other Ambulatory Visit: Payer: Self-pay | Admitting: Family Medicine

## 2019-08-18 ENCOUNTER — Telehealth: Payer: Self-pay | Admitting: Gastroenterology

## 2019-08-18 NOTE — Telephone Encounter (Signed)
Pt requested to change OV to virtual visit.

## 2019-08-27 ENCOUNTER — Encounter: Payer: Self-pay | Admitting: Gastroenterology

## 2019-08-27 ENCOUNTER — Other Ambulatory Visit: Payer: Self-pay

## 2019-08-27 ENCOUNTER — Telehealth (INDEPENDENT_AMBULATORY_CARE_PROVIDER_SITE_OTHER): Payer: BC Managed Care – PPO | Admitting: Gastroenterology

## 2019-08-27 DIAGNOSIS — K219 Gastro-esophageal reflux disease without esophagitis: Secondary | ICD-10-CM | POA: Diagnosis not present

## 2019-08-27 DIAGNOSIS — Z1211 Encounter for screening for malignant neoplasm of colon: Secondary | ICD-10-CM

## 2019-08-27 MED ORDER — FAMOTIDINE 40 MG PO TABS: 40.0000 mg | ORAL_TABLET | Freq: Every day | ORAL | 2 refills | Status: DC

## 2019-08-27 MED ORDER — PANTOPRAZOLE SODIUM 40 MG PO TBEC: 40.0000 mg | DELAYED_RELEASE_TABLET | Freq: Two times a day (BID) | ORAL | 3 refills | Status: DC

## 2019-08-27 NOTE — Progress Notes (Signed)
Chief Complaint: Reflux  Referring Provider:  Carollee Herter, Alferd Apa, *      ASSESSMENT AND PLAN;   #1. GERD with mod HH (on CT)  #2.  Colorectal cancer screening  #3.  Comorbid conditions include A Fib on eliquis, situational anxiety  Plan: - Stop dexilant - Protonix 40mg  po bid (#60) 1/2 hr before breakfast and before supper.  Also, pepcid 40mg  po qhs  X 12 weeks.  -She will call us in 2 weeks.  If she is better can reduce Pepcid to 20 mg p.o. nightly -Recommend EGD and colonoscopy off Eliquis, after cardiology clearance likely in January 2021. -Follow-up in 12 weeks. -If still with problems, will give her a trial of GI cocktail. -Raise the head of the bed by 6 inch blocks. -Can start taking Cardizem in the morning.  She will also get in touch with cardiology regarding Cardizem/flecainide dosing.   HPI:    Alexandria [name] is a 58 y.o. female  With significant reflux symptoms for last 1 year, with water brash, regurgitation, burning sensation coming all the way up to the throat, especially at night without any odynophagia or dysphagia.  This is causing significant problems to her.  Interfering with her lifestyle as well.  She is quite upset about the symptoms.  This is despite Dexilant every day.  She has been on Dexilant for last 1 year.  Not helping.   No nonsteroidals.  No sodas, chocolates, chewing gums and candy.   Had cardiac CT which showed moderate hiatal hernia.  She also had significant heartburn during pregnancy several years ago.  No melena or hematochezia.  Never had EGD or colonoscopy.   Past Medical History:  Diagnosis Date  . Anxiety   . Atrial fibrillation ([place])   . GERD (gastroesophageal reflux disease)   . Graves disease   . Hiatal hernia    a. seen on CT 12/2016.  Marland Kitchen Hyperlipidemia   . Hypertension   . NSVT (nonsustained ventricular tachycardia) ([place])   . Pulmonary nodules    a. seen on CT 12/2016.  . Stroke ([place])   . TIA (transient ischemic  attack) 01/2017   a. tx at [facility].    Past Surgical History:  Procedure Laterality Date  . ATRIAL FIBRILLATION ABLATION N/A 09/12/2018   Procedure: ATRIAL FIBRILLATION ABLATION;  Surgeon: Thompson Grayer, MD;  Location: [facility];  Service: Cardiovascular;  Laterality: N/A;  . CARPAL TUNNEL RELEASE      Family History  Problem Relation Age of Onset  . Coronary artery disease Father   . Atrial fibrillation Father   . Diabetes Maternal Grandmother   . Hyperlipidemia Mother   . Dementia Mother   . Coronary artery disease Paternal Grandfather   . Coronary artery disease Paternal Uncle   . Colon polyps Brother        says he goes every year to have a colonoscopy  . Colon cancer Neg Hx   . Esophageal cancer Neg Hx     Social History   Tobacco Use  . Smoking status: Never Smoker  . Smokeless tobacco: Never Used  Substance Use Topics  . Alcohol use: Yes    Alcohol/week: 1.0 standard drinks    Types: 1 Glasses of wine per week    Comment: occasionally,   . Drug use: No    Current Outpatient Medications  Medication Sig Dispense Refill  . acetaminophen (TYLENOL) 500 MG tablet Take 500 mg by mouth 2 (two) times  daily as needed for moderate pain or headache.    . ALPRAZolam (XANAX) 0.5 MG tablet TAKE 1/2 TO 1 TABLET BY MOUTH TWICE A DAY AS NEEDED 30 tablet 1  . atorvastatin (LIPITOR) 40 MG tablet TAKE 1 TABLET BY MOUTH EVERY DAY IN THE EVENING 90 tablet 1  . DEXILANT 60 MG capsule TAKE 1 CAPSULE BY MOUTH EVERY DAY 30 capsule 2  . diltiazem (CARDIZEM CD) 240 MG 24 hr capsule TAKE 1 CAPSULE BY MOUTH EVERY DAY 90 capsule 2  . diltiazem (CARDIZEM) 30 MG tablet Take 1 tablet (30 mg total) by mouth daily as needed. For palpitations/heart rate above 110bpm 90 tablet 1  . ELIQUIS 5 MG TABS tablet TAKE 1 TABLET BY MOUTH TWICE A DAY 60 tablet 5  . famotidine (PEPCID) 20 MG tablet Take 20 mg by mouth as needed.     . flecainide (TAMBOCOR) 100 MG tablet Take 1 tablet (100 mg  total) by mouth 2 (two) times daily. 60 tablet 6  . levothyroxine (SYNTHROID, LEVOTHROID) 112 MCG tablet Take 112 mcg by mouth daily.  6  . oxymetazoline (AFRIN) 0.05 % nasal spray Place 1 spray into both nostrils as needed for congestion.     . Tetrahydrozoline HCl (VISINE OP) Place 1 drop into both eyes daily as needed (dry eyes).     No current facility-administered medications for this visit.     Allergies  Allergen Reactions  . Peanut-Containing Drug Products Swelling    Review of Systems:  Constitutional: Denies fever, chills, diaphoresis, appetite change and fatigue.  HEENT: Denies photophobia, eye pain, redness, hearing loss, ear pain, congestion, sore throat, rhinorrhea, sneezing, mouth sores, neck pain, neck stiffness and tinnitus.   Respiratory: Denies SOB, DOE, cough, chest tightness,  and wheezing.   Cardiovascular: Denies chest pain, palpitations and leg swelling.  Genitourinary: Denies dysuria, urgency, frequency, hematuria, flank pain and difficulty urinating.  Musculoskeletal: Denies myalgias, back pain, joint swelling, arthralgias and gait problem.  Skin: No rash.  Neurological: Denies dizziness, seizures, syncope, weakness, light-headedness, numbness and headaches.  Hematological: Denies adenopathy. Easy bruising, personal or family bleeding history  Psychiatric/Behavioral: has anxiety or depression     Physical Exam:    Ht 5\' 7"  (1.702 m)   Wt 185 lb (83.9 kg)   LMP 01/26/2012   BMI 28.98 kg/m  Filed Weights   08/27/19 0805  Weight: 185 lb (83.9 kg)   Not examined since it was a televisit.  Data Reviewed: I have personally reviewed following labs and imaging studies  CBC: CBC Latest Ref Rng & Units 09/05/2018 04/04/2018 12/11/2016  WBC 3.4 - 10.8 x10E3/uL 7.8 8.0 5.7  Hemoglobin 11.1 - 15.9 g/dL 12.7 14.1 12.4  Hematocrit 34.0 - 46.6 % 38.0 40.6 36.7  Platelets 150 - 450 x10E3/uL 308 292 294.0    CMP: CMP Latest Ref Rng & Units 09/05/2018 04/22/2018  04/04/2018  Glucose 65 - 99 mg/dL 86 103(H) 144(H)  BUN 6 - 24 mg/dL 16 19 12   Creatinine 0.57 - 1.00 mg/dL 0.75 0.80 0.76  Sodium 134 - 144 mmol/L 142 140 139  Potassium 3.5 - 5.2 mmol/L 3.9 4.3 3.3(L)  Chloride 96 - 106 mmol/L 103 104 107  CO2 20 - 29 mmol/L 24 29 24   Calcium 8.7 - 10.2 mg/dL 9.1 9.3 8.8(L)  Total Protein 6.0 - 8.3 g/dL - 6.7 -  Total Bilirubin 0.2 - 1.2 mg/dL - 0.6 -  Alkaline Phos 39 - 117 U/L - 109 -  AST 0 -  37 U/L - 12 -  ALT 0 - 35 U/L - 17 -  This service was provided via telemedicine.  The patient was located at home.  The provider was located in office.  The patient did consent to this telephone visit and is aware of possible charges through their insurance for this visit.  The patient was referred by Dr. Carollee Herter.   Time spent on call/coordination of care: 30 min    Carmell Austria, MD 08/27/2019, 11:17 AM  Cc: Carollee Herter, Alferd Apa, *

## 2019-08-27 NOTE — Patient Instructions (Addendum)
If you are age 60 or older, your body mass index should be between 23-30. Your Body mass index is 28.98 kg/m. If this is out of the aforementioned range listed, please consider follow up with your Primary Care Provider.  If you are age 67 or younger, your body mass index should be between 19-25. Your Body mass index is 28.98 kg/m. If this is out of the aformentioned range listed, please consider follow up with your Primary Care Provider.   We have sent the following medications to your pharmacy for you to pick up at your convenience: Protonix  Pepcid   Please call Dr. Leland Her nurse Faythe Casa, RN)  in 2 weeks at [phone]  to let her now how you are doing.   Follow up in 12 weeks.   Thank you,  Dr. Jackquline Denmark

## 2019-08-28 ENCOUNTER — Telehealth: Payer: Self-pay | Admitting: Internal Medicine

## 2019-08-28 NOTE — Telephone Encounter (Signed)
Pt c/o medication issue:  1. Name of Medication: diltiazem (CARDIZEM CD) 240 MG 24 hr capsule  flecainide (TAMBOCOR) 100 MG tablet  2. How are you currently taking this medication (dosage and times per day)? as directed   3. Are you having a reaction (difficulty breathing--STAT)? no  4. What is your medication issue? Patient states that she is having acid reflux and was wondering if it is coming from these medications. She would like to know if the medication dosage needs to be changed.

## 2019-09-01 MED ORDER — FLECAINIDE ACETATE 50 MG PO TABS: 50.0000 mg | ORAL_TABLET | Freq: Two times a day (BID) | ORAL | 3 refills | Status: DC

## 2019-09-01 NOTE — Telephone Encounter (Signed)
Diltiazem can cause some feelings on indigestion. I agree with changing dosing to AM per GI. Could consider changing to beta blocker, however metoprolol does have an 1% incidence rate of heartburn. I would give Alexandria [name] new regimen of protonix, pepcid and taking diltiazem in AM several weeks before making any more changes. Looks like she saw GI on 10/21. HR in the 50's so could consider decreasing diltiazem to 180. Not sure decreasing flecainide dose would help as patient reports symptoms for 1 year and Alexandria [name] flecainide was just increased in august

## 2019-09-01 NOTE — Telephone Encounter (Signed)
Returned call to Pt.  She has changed to taking her diltiazem in the morning.  Per Pt she is going to reduce her flecainide back to 50 mg BID.  Had been increased at last appt d/t breakthrough afib episode, but she is going to reduce back at this time because of heartburn issues. (Pt preference)  Advised to call this nurse if she has any afib breakthrough.  Pt thanked nurse for call.

## 2019-09-16 ENCOUNTER — Other Ambulatory Visit: Payer: Self-pay | Admitting: Family Medicine

## 2019-09-16 ENCOUNTER — Other Ambulatory Visit: Payer: Self-pay | Admitting: Internal Medicine

## 2019-09-16 DIAGNOSIS — K219 Gastro-esophageal reflux disease without esophagitis: Secondary | ICD-10-CM

## 2019-09-16 DIAGNOSIS — K449 Diaphragmatic hernia without obstruction or gangrene: Secondary | ICD-10-CM

## 2019-09-16 NOTE — Telephone Encounter (Addendum)
Pt last saw Dr Rayann Heman 06/18/19, last labs 09/05/18 Creat 0.75, overdue for labwork, age 58, weight 83.9kg, based on specified criteria pt is on appropriate dosage of Eliquis 5mg  BID.  Will refill rx and place note on next appt with Dr Rayann Heman needs CBC and BMP f/u Eliquis labwork.

## 2019-11-21 ENCOUNTER — Other Ambulatory Visit: Payer: Self-pay | Admitting: Gastroenterology

## 2019-11-21 MED ORDER — FAMOTIDINE 40 MG PO TABS: 40.0000 mg | ORAL_TABLET | Freq: Every day | ORAL | 60 refills | Status: DC

## 2019-11-22 ENCOUNTER — Other Ambulatory Visit: Payer: Self-pay | Admitting: Gastroenterology

## 2019-12-19 ENCOUNTER — Telehealth: Payer: Self-pay | Admitting: Internal Medicine

## 2019-12-19 NOTE — Telephone Encounter (Signed)
New Message:    Pt needs lab work before her appt on 01-14-20.  She would like for you to mail her a lab order so she can have her lab work at [facility] in [place] please.

## 2020-01-01 ENCOUNTER — Other Ambulatory Visit (HOSPITAL_COMMUNITY): Payer: Self-pay | Admitting: Internal Medicine

## 2020-01-13 ENCOUNTER — Other Ambulatory Visit: Payer: Self-pay | Admitting: Family Medicine

## 2020-01-13 NOTE — Progress Notes (Addendum)
Cardiology Office Note Date:  01/13/2020  Patient ID:  Alexandria [name], Alexandria [name] [birth year], MRN [id number] PCP:  [name], DO  Electrophysiologist:  Dr. Rayann Heman   Chief Complaint: scheduled follow up  History of Present Illness: Alexandria [name] is a 59 y.o. female with history of Afib, NSVT, HTN, HLD, stroke/TIA, graves disease.  She comes in today to be seen for dr. Rayann Heman.  He last saw her Aug 2020, her flecainide had been previously reduced to 50mg  BID post ablation, though had a breakthrough episode of AFib, likely provoked with increased personal stress.  Her flecainide increased back to 100mg  BID    She is doing well on the flecainide 50mg  BID.  In anticipation of today's visit and hopes that she could reduce her medications she started to wean herself off the flecainide, but when she went to one pill daily she started to have palpitations and brief episodes of AFib, she went back to 50mg  BID and this along with her diltiazem has keps her Afib well controlled.  She walks her dogs and is actuv/busy and denies any exertional intolerances (outside of heel pain), no CP, SOB, DOE.  No dizzy spells, near syncope or syncope. No bleeding or signs of bleeding.  She is likely going toh ave a screening colonoscopy over the summer, asks about being off the eliquis for a couple days.   AFib Hx: Diagnosed 2019 PVI ablation 09/12/2018 (Dr. Rayann Heman)  AAD hx Flecainide started 05/10/2018 is current  Past Medical History:  Diagnosis Date  . Anxiety   . Atrial fibrillation ([place])   . Depression   . GERD (gastroesophageal reflux disease)   . Graves disease   . Hiatal hernia    a. seen on CT 12/2016.  Marland Kitchen Hyperlipidemia   . Hypertension   . NSVT (nonsustained ventricular tachycardia) ([place])   . Pulmonary nodules    a. seen on CT 12/2016.  . Stroke ([place])   . TIA (transient ischemic attack) 01/2017   a. tx at [facility].    Past Surgical History:  Procedure Laterality Date  . ATRIAL  FIBRILLATION ABLATION N/A 09/12/2018   Procedure: ATRIAL FIBRILLATION ABLATION;  Surgeon: Alexandria Grayer, MD;  Location: [facility];  Service: Cardiovascular;  Laterality: N/A;  . CARPAL TUNNEL RELEASE      Current Outpatient Medications  Medication Sig Dispense Refill  . acetaminophen (TYLENOL) 500 MG tablet Take 500 mg by mouth 2 (two) times daily as needed for moderate pain or headache.    . ALPRAZolam (XANAX) 0.5 MG tablet TAKE 1/2 TO 1 TABLET BY MOUTH TWICE A DAY AS NEEDED 30 tablet 1  . atorvastatin (LIPITOR) 40 MG tablet TAKE 1 TABLET BY MOUTH EVERY DAY IN THE EVENING 90 tablet 0  . DEXILANT 60 MG capsule TAKE 1 CAPSULE BY MOUTH EVERY DAY 30 capsule 2  . diltiazem (CARDIZEM CD) 240 MG 24 hr capsule TAKE 1 CAPSULE BY MOUTH EVERY DAY 90 capsule 1  . diltiazem (CARDIZEM) 30 MG tablet Take 1 tablet (30 mg total) by mouth daily as needed. For palpitations/heart rate above 110bpm 90 tablet 1  . ELIQUIS 5 MG TABS tablet TAKE 1 TABLET BY MOUTH TWICE A DAY 60 tablet 5  . famotidine (PEPCID) 40 MG tablet Take 1 tablet (40 mg total) by mouth at bedtime. 30 tablet 60  . flecainide (TAMBOCOR) 50 MG tablet Take 1 tablet (50 mg total) by mouth 2 (two) times daily. 180 tablet 3  . levothyroxine (  SYNTHROID, LEVOTHROID) 112 MCG tablet Take 112 mcg by mouth daily.  6  . oxymetazoline (AFRIN) 0.05 % nasal spray Place 1 spray into both nostrils as needed for congestion.     . pantoprazole (PROTONIX) 40 MG tablet TAKE 1 TABLET BY MOUTH TWICE A DAY 180 tablet 1  . Tetrahydrozoline HCl (VISINE OP) Place 1 drop into both eyes daily as needed (dry eyes).     No current facility-administered medications for this visit.    Allergies:   Peanut-containing drug products   Social History:  The patient  reports that she has never smoked. She has never used smokeless tobacco. She reports current alcohol use of about 1.0 standard drinks of alcohol per week. She reports that she does not use drugs.   Family  History:  The patient's family history includes Atrial fibrillation in her father; Colon polyps in her brother; Coronary artery disease in her father, paternal grandfather, and paternal uncle; Dementia in her mother; Diabetes in her maternal grandmother; Hyperlipidemia in her mother.  ROS:  Please see the history of present illness.  All other systems are reviewed and otherwise negative.   PHYSICAL EXAM:  VS:  LMP 01/26/2012  BMI: There is no height or weight on file to calculate BMI. Well nourished, well developed, in no acute distress  HEENT: normocephalic, atraumatic  Neck: no JVD, carotid bruits or masses Cardiac:  RRR; no significant murmurs, no rubs, or gallops Lungs:  CTA b/l, no wheezing, rhonchi or rales  Abd: soft, nontender MS: no deformity or atrophy Ext: no edema  Skin: warm and dry, no rash Neuro:  No gross deficits appreciated Psych: euthymic mood, full affect   EKG:  Done today and reviewed by myself shows SB 58bpm, stable intervals   09/12/2018: EPS/PVI ablation, (Dr. Rayann Heman) CONCLUSIONS: 1. Sinus rhythm upon presentation.   2. Intracardiac echo reveals a moderate sized left atrium with four separate pulmonary veins without evidence of pulmonary vein stenosis. 3. Successful electrical isolation and anatomical encircling of all four pulmonary veins with radiofrequency current. 4. No inducible arrhythmias following ablation both on and off of Isuprel 5. No early apparent complications.   04/23/2018: Stress myoview (exercise)  Nuclear stress EF: 63%.  The left ventricular ejection fraction is normal (55-65%).  Blood pressure demonstrated a normal response to exercise.  Upsloping ST segment depression ST segment depression of 1 mm was noted during stress in the II, III, aVF, V4 and V5 leads, and returning to baseline after less than 1 minute of recovery.  This is a low risk study.   1. EF 63%, normal wall motion.  2. No evidence for ischemia or infarction on  perfusion images.   3. Nonspecific upsloping ST depression in several leads.    Low risk study based on perfusion images.    04/05/2018: TTE Study Conclusions  - Left ventricle: The cavity size was normal. Systolic function was  normal. The estimated ejection fraction was in the range of 60%  to 65%. Wall motion was normal; there were no regional wall  motion abnormalities. Left ventricular diastolic function  parameters were normal.  - Mitral valve: There was mild regurgitation.  - Left atrium: The atrium was mildly dilated.  - Atrial septum: No defect or patent foramen ovale was identified.  - Impressions: Appears to be some shadowing artifact in LA on  subcostal images.   Impressions:  - Appears to be some shadowing artifact in LA on subcostal images.      Recent Labs:  No results found for requested labs within last 8760 hours.  No results found for requested labs within last 8760 hours.   CrCl cannot be calculated (Patient's most recent lab result is older than the maximum 21 days allowed.).   Wt Readings from Last 3 Encounters:  08/27/19 185 lb (83.9 kg)  06/18/19 180 lb (81.6 kg)  12/16/18 183 lb 12.8 oz (83.4 kg)     Other studies reviewed: Additional studies/records reviewed today include: summarized above  ASSESSMENT AND PLAN:  1. Paroxysmal Afib     CHA2DS2Vasc is 4, on eliquis, appropriately dosed     Flecainide/diltiazem     Controlled with her current regime  She mentions that she feels like she has a large number of medicines making her feel older then she is,  and inquires about reducing them.  Since she had breakthrough palpitations/AFib when she self weaned her flecainide , and the current regime is well tolerated and controled her rhythm, I would not make any adjustments. She is comfortable with that, no changes.  BMET and CBC today   2. HTN     Looks good, no changes        Disposition: F/u with [place] in 40mo, sooner if  needed.  NOTE: after the patient left, I considered perhaps she might be considered for a re-do ablation.  I did not discuss this with the patient, though will reach out to Dr. Rayann Heman for his thoughts.    Current medicines are reviewed at length with the patient today.  The patient did not have any concerns regarding medicines.  Venetia Night, PA-C 01/13/2020 6:44 PM     [place] [address] [phone] (office)  [phone] (fax)

## 2020-01-14 ENCOUNTER — Ambulatory Visit (INDEPENDENT_AMBULATORY_CARE_PROVIDER_SITE_OTHER): Payer: BC Managed Care – PPO | Admitting: Physician Assistant

## 2020-01-14 ENCOUNTER — Other Ambulatory Visit: Payer: Self-pay

## 2020-01-14 VITALS — BP 122/72 | HR 58 | Ht 67.0 in | Wt 191.0 lb

## 2020-01-14 DIAGNOSIS — I10 Essential (primary) hypertension: Secondary | ICD-10-CM | POA: Diagnosis not present

## 2020-01-14 DIAGNOSIS — Z79899 Other long term (current) drug therapy: Secondary | ICD-10-CM

## 2020-01-14 DIAGNOSIS — I480 Paroxysmal atrial fibrillation: Secondary | ICD-10-CM | POA: Diagnosis not present

## 2020-01-14 NOTE — Patient Instructions (Signed)
Medication Instructions:   Your physician recommends that you continue on your current medications as directed. Please refer to the Current Medication list given to you today.  *If you need a refill on your cardiac medications before your next appointment, please call your pharmacy*   Lab Work:  CBC AND BMET    If you have labs (blood work) drawn today and your tests are completely normal, you will receive your results only by: Marland Kitchen MyChart Message (if you have MyChart) OR . A paper copy in the mail If you have any lab test that is abnormal or we need to change your treatment, we will call you to review the results.   Testing/Procedures: NONE ORDERED  TODAY    Follow-Up: At [facility], you and your health needs are our priority.  As part of our continuing mission to provide you with exceptional heart care, we have created designated Provider Care Teams.  These Care Teams include your primary Cardiologist (physician) and Advanced Practice Providers (APPs -  Physician Assistants and Nurse Practitioners) who all work together to provide you with the care you need, when you need it.  We recommend signing up for the patient portal called "MyChart".  Sign up information is provided on this After Visit Summary.  MyChart is used to connect with patients for Virtual Visits (Telemedicine).  Patients are able to view lab/test results, encounter notes, upcoming appointments, etc.  Non-urgent messages can be sent to your provider as well.   To learn more about what you can do with MyChart, go to [email or web address].    Your next appointment:    6 month(s)  The format for your next appointment:    In Person  Provider:   You may see   Dr. Rayann Heman  or one of the following Advanced Practice Providers on your designated Care Team:    Chanetta Marshall, NP  Tommye Standard, PA-C  Legrand Como "Oda Kilts, [place]    Other Instructions

## 2020-01-19 ENCOUNTER — Telehealth: Payer: Self-pay | Admitting: Physician Assistant

## 2020-01-19 LAB — CBC
Hematocrit: 36.3 % (ref 34.0–46.6)
Hemoglobin: 11.7 g/dL (ref 11.1–15.9)
MCH: 28.2 pg (ref 26.6–33.0)
MCHC: 32.2 g/dL (ref 31.5–35.7)
MCV: 88 fL (ref 79–97)
Platelets: 299 10*3/uL (ref 150–450)
RBC: 4.15 x10E6/uL (ref 3.77–5.28)
RDW: 13.6 % (ref 11.7–15.4)
WBC: 6.5 10*3/uL (ref 3.4–10.8)

## 2020-01-19 LAB — BASIC METABOLIC PANEL
BUN/Creatinine Ratio: 14 (ref 12–28)
BUN: 17 mg/dL (ref 8–27)
CO2: 22 mmol/L (ref 20–29)
Calcium: 9 mg/dL (ref 8.7–10.3)
Chloride: 105 mmol/L (ref 96–106)
Creatinine, Ser: 1.2 mg/dL — ABNORMAL HIGH (ref 0.57–1.00)
GFR calc Af Amer: 57 mL/min/{1.73_m2} — ABNORMAL LOW (ref >59)
GFR calc non Af Amer: 49 mL/min/{1.73_m2} — ABNORMAL LOW (ref >59)
Glucose: 126 mg/dL — ABNORMAL HIGH (ref 65–99)
Potassium: 4 mmol/L (ref 3.5–5.2)
Sodium: 141 mmol/L (ref 134–144)

## 2020-01-19 NOTE — Telephone Encounter (Signed)
Called the patient to see if she would be interested in d/w Dr. Rayann Heman a repeat ablation procedure, after our discussion/visit last week.  Had reached out to Dr. Rayann Heman and if this is something she would be interested in would get her scheduled to see him and discuss further.  DPR noted OK to leave message on her VM  Left message mentioning non-urgent, to discuss AFib strategy options after reaching out to Dr. Rayann Heman and for her to call back with a time that may be good for me to call her.  Tommye Standard, PA-C

## 2020-03-10 ENCOUNTER — Other Ambulatory Visit: Payer: Self-pay | Admitting: Internal Medicine

## 2020-03-21 ENCOUNTER — Other Ambulatory Visit: Payer: Self-pay | Admitting: Internal Medicine

## 2020-03-22 NOTE — Telephone Encounter (Signed)
Eliquis 5mg  refill request received. Patient is 59 years old, weight-86.6kg, Crea-1.20 on 01/14/2020, Diagnosis-Afib, and last seen by Tommye Standard on 01/14/2020. Dose is appropriate based on dosing criteria. Will send in refill to requested pharmacy.

## 2020-05-12 ENCOUNTER — Telehealth: Payer: Self-pay | Admitting: *Deleted

## 2020-05-12 NOTE — Telephone Encounter (Signed)
Clinical pharmacist to review 

## 2020-05-12 NOTE — Telephone Encounter (Signed)
   [facility] Pre-operative Risk Assessment    HEARTCARE STAFF: - Please ensure there is not already an duplicate clearance open for this procedure. - Under Visit Info/Reason for Call, type in Other and utilize the format Clearance MM/DD/YY or Clearance TBD. Do not use dashes or single digits. - If request is for dental extraction, please clarify the # of teeth to be extracted.  Request for surgical clearance:  1. What type of surgery is being performed? TISSUE GRAFTING OF TOOTH # 24   2. When is this surgery scheduled? TBD   3. What type of clearance is required (medical clearance vs. Pharmacy clearance to hold med vs. Both)? BOTH  4. Are there any medications that need to be held prior to surgery and how long? ELIQUIS   5. Practice name and name of physician performing surgery? Malachy Chamber, DDS, M.S   6. What is the office phone number? [phone]   7.   What is the office fax number? [phone]  8.   Anesthesia type (None, local, MAC, general) ? NOT LISTED   Alexandria [name] 05/12/2020, 5:03 PM  _________________________________________________________________   (provider comments below)

## 2020-05-13 NOTE — Telephone Encounter (Signed)
Patient with diagnosis of afib on Eliquis for anticoagulation.    Procedure: TISSUE GRAFTING OF TOOTH # 24  Date of procedure: TBD  CHADS2-VASc score of  4 (HTN, stroke/tia x 2, female)  CrCl 55 ml/min  Per office protocol, patient can hold Eliquis for 1 day prior to procedure.

## 2020-05-13 NOTE — Telephone Encounter (Signed)
   Primary Cardiologist: Dr. Rayann Heman  Chart reviewed as part of pre-operative protocol coverage. Patient was contacted 05/13/2020 in reference to pre-operative risk assessment for pending surgery as outlined below.  Alexandria [name] was last seen on 01/14/2020 by Dr. Rayann Heman.  Since that day, Alexandria [name] has done well without chest pain.  Therefore, based on ACC/AHA guidelines, the patient would be at acceptable risk for the planned procedure without further cardiovascular testing.   I will route this recommendation to the requesting party via Epic fax function and remove from pre-op pool. Please call with questions.  Per our clinical pharmacist, patient may hold Eliquis for 1 day prior to the procedure and restart as soon as possible afterward at the discretion of the surgeon.  [place], [place] 05/13/2020, 5:38 PM

## 2020-05-18 ENCOUNTER — Other Ambulatory Visit: Payer: Self-pay | Admitting: Gastroenterology

## 2020-05-18 ENCOUNTER — Encounter: Payer: Self-pay | Admitting: Family Medicine

## 2020-05-18 ENCOUNTER — Other Ambulatory Visit: Payer: Self-pay | Admitting: Family Medicine

## 2020-05-18 DIAGNOSIS — Z1211 Encounter for screening for malignant neoplasm of colon: Secondary | ICD-10-CM

## 2020-05-18 NOTE — Telephone Encounter (Signed)
Please advise 

## 2020-05-18 NOTE — Telephone Encounter (Signed)
Referral placed.

## 2020-05-24 ENCOUNTER — Other Ambulatory Visit: Payer: Self-pay | Admitting: Family Medicine

## 2020-06-27 ENCOUNTER — Other Ambulatory Visit: Payer: Self-pay | Admitting: Internal Medicine

## 2020-06-29 MED ORDER — FLECAINIDE ACETATE 50 MG PO TABS: 50.0000 mg | ORAL_TABLET | Freq: Two times a day (BID) | ORAL | 1 refills | Status: DC

## 2020-07-01 ENCOUNTER — Telehealth: Payer: Self-pay | Admitting: Internal Medicine

## 2020-07-01 NOTE — Telephone Encounter (Signed)
New message   Pt would like to know why the request for 50 mg fleccainde was denied. She said would like to get 50 instead of 100 so she doesn't have to cut them in half. Please call.

## 2020-07-01 NOTE — Telephone Encounter (Signed)
Spoke to the patient about her flecainide. I let her know that a 50 mg refill was sent in 8/24. Also that if she has refilled the 100 mg recently that the pharmacy may not fill the medication yet until it's been enough time to use those. She verbalized understanding.

## 2020-07-25 ENCOUNTER — Other Ambulatory Visit: Payer: Self-pay | Admitting: Family Medicine

## 2020-08-13 ENCOUNTER — Ambulatory Visit: Payer: BC Managed Care – PPO | Admitting: Internal Medicine

## 2020-08-13 ENCOUNTER — Other Ambulatory Visit: Payer: Self-pay

## 2020-08-13 ENCOUNTER — Encounter: Payer: Self-pay | Admitting: Internal Medicine

## 2020-08-13 VITALS — BP 148/88 | HR 55 | Ht 67.0 in | Wt 185.8 lb

## 2020-08-13 DIAGNOSIS — I480 Paroxysmal atrial fibrillation: Secondary | ICD-10-CM | POA: Diagnosis not present

## 2020-08-13 DIAGNOSIS — I10 Essential (primary) hypertension: Secondary | ICD-10-CM | POA: Diagnosis not present

## 2020-08-13 NOTE — Progress Notes (Signed)
PCP: [name], DO   Primary EP: Dr Domingo Sep is a 59 y.o. female who presents today for routine electrophysiology followup.  Since last being seen in our clinic, the patient reports doing very well.  She has occasional afib/ palpitations.  Today, she denies symptoms of chest pain, shortness of breath,  lower extremity edema, dizziness, presyncope, or syncope.  The patient is otherwise without complaint today.   Past Medical History:  Diagnosis Date  . Anxiety   . Atrial fibrillation ([place])   . Depression   . GERD (gastroesophageal reflux disease)   . Graves disease   . Hiatal hernia    a. seen on CT 12/2016.  Marland Kitchen Hyperlipidemia   . Hypertension   . NSVT (nonsustained ventricular tachycardia) ([place])   . Pulmonary nodules    a. seen on CT 12/2016.  . Stroke ([place])   . TIA (transient ischemic attack) 01/2017   a. tx at [facility].   Past Surgical History:  Procedure Laterality Date  . ATRIAL FIBRILLATION ABLATION N/A 09/12/2018   Procedure: ATRIAL FIBRILLATION ABLATION;  Surgeon: Thompson Grayer, MD;  Location: [facility];  Service: Cardiovascular;  Laterality: N/A;  . CARPAL TUNNEL RELEASE      ROS- all systems are reviewed and negatives except as per HPI above  Current Outpatient Medications  Medication Sig Dispense Refill  . acetaminophen (TYLENOL) 500 MG tablet Take 500 mg by mouth 2 (two) times daily as needed for moderate pain or headache.    . ALPRAZolam (XANAX) 0.5 MG tablet TAKE 1/2 TO 1 TABLET BY MOUTH TWICE A DAY AS NEEDED 30 tablet 1  . atorvastatin (LIPITOR) 40 MG tablet TAKE 1 TABLET BY MOUTH EVERY DAY IN THE EVENING 90 tablet 0  . DEXILANT 60 MG capsule TAKE 1 CAPSULE BY MOUTH EVERY DAY 30 capsule 2  . diltiazem (CARDIZEM CD) 240 MG 24 hr capsule TAKE 1 CAPSULE BY MOUTH EVERY DAY 90 capsule 1  . ELIQUIS 5 MG TABS tablet TAKE 1 TABLET BY MOUTH TWICE A DAY 60 tablet 9  . famotidine (PEPCID) 40 MG tablet Take 1 tablet (40 mg total) by mouth  at bedtime. 30 tablet 60  . flecainide (TAMBOCOR) 50 MG tablet Take 1 tablet (50 mg total) by mouth 2 (two) times daily. 180 tablet 1  . levothyroxine (SYNTHROID, LEVOTHROID) 112 MCG tablet Take 112 mcg by mouth daily.  6  . oxymetazoline (AFRIN) 0.05 % nasal spray Place 1 spray into both nostrils as needed for congestion.     . pantoprazole (PROTONIX) 40 MG tablet TAKE 1 TABLET BY MOUTH TWICE A DAY 180 tablet 1  . Tetrahydrozoline HCl (VISINE OP) Place 1 drop into both eyes daily as needed (dry eyes).    Marland Kitchen diltiazem (CARDIZEM) 30 MG tablet Take 1 tablet (30 mg total) by mouth daily as needed. For palpitations/heart rate above 110bpm 90 tablet 1   No current facility-administered medications for this visit.    Physical Exam: Vitals:   08/13/20 1629  BP: (!) 148/88  Pulse: (!) 55  SpO2: 98%  Weight: 185 lb 12.8 oz (84.3 kg)  Height: 5\' 7"  (1.702 m)    GEN- The patient is well appearing, alert and oriented x 3 today.   Head- normocephalic, atraumatic Eyes-  Sclera clear, conjunctiva pink Ears- hearing intact Oropharynx- clear Lungs-   normal work of breathing Heart- Regular rate and rhythm  GI- soft  Extremities- no clubbing, cyanosis, or edema  Wt Readings from Last 3 Encounters:  08/13/20 185 lb 12.8 oz (84.3 kg)  01/14/20 191 lb (86.6 kg)  08/27/19 185 lb (83.9 kg)    EKG tracing ordered today is personally reviewed and shows sinus with first degree AV block  Assessment and Plan:  1. Paroxysmal atrial fibrillation Dong very well s/p PVI in 2019 She continues with flecainide and diltiazem.  I have reviewed her K[employer]ia[employer] strips today which do confirm occasional afib.  We will follow her closely to avoid toxicity with flecainide chads2vasc score is 4.  She is on eliquis We discussed repeat ablation.  She would like to avoid this currently due to cost.  2. HTN Stable No change required today   Risks, benefits and potential toxicities for medications prescribed  and/or refilled reviewed with patient today.   Return in 6 months  Thompson Grayer MD, [facility] 08/13/2020 4:36 PM

## 2020-08-13 NOTE — Patient Instructions (Signed)
Medication Instructions:  Your physician recommends that you continue on your current medications as directed. Please refer to the Current Medication list given to you today.  *If you need a refill on your cardiac medications before your next appointment, please call your pharmacy*  Lab Work: None ordered.  If you have labs (blood work) drawn today and your tests are completely normal, you will receive your results only by: Marland Kitchen MyChart Message (if you have MyChart) OR . A paper copy in the mail If you have any lab test that is abnormal or we need to change your treatment, we will call you to review the results.  Testing/Procedures: None ordered.  Follow-Up: At [facility], you and your health needs are our priority.  As part of our continuing mission to provide you with exceptional heart care, we have created designated Provider Care Teams.  These Care Teams include your primary Cardiologist (physician) and Advanced Practice Providers (APPs -  Physician Assistants and Nurse Practitioners) who all work together to provide you with the care you need, when you need it.  We recommend signing up for the patient portal called "MyChart".  Sign up information is provided on this After Visit Summary.  MyChart is used to connect with patients for Virtual Visits (Telemedicine).  Patients are able to view lab/test results, encounter notes, upcoming appointments, etc.  Non-urgent messages can be sent to your provider as well.   To learn more about what you can do with MyChart, go to [email or web address].    Your next appointment:   Your physician wants you to follow-up in: 02/14/21 at 4 pm with Dr. Rayann Heman.     Other Instructions:

## 2020-08-30 ENCOUNTER — Other Ambulatory Visit: Payer: Self-pay | Admitting: Gastroenterology

## 2020-09-05 ENCOUNTER — Encounter: Payer: Self-pay | Admitting: Family Medicine

## 2020-10-12 ENCOUNTER — Ambulatory Visit: Payer: BC Managed Care – PPO | Admitting: Gastroenterology

## 2020-11-14 ENCOUNTER — Other Ambulatory Visit: Payer: Self-pay | Admitting: Family Medicine

## 2020-11-14 ENCOUNTER — Other Ambulatory Visit: Payer: Self-pay | Admitting: Internal Medicine

## 2020-11-18 ENCOUNTER — Ambulatory Visit: Payer: BC Managed Care – PPO | Admitting: Gastroenterology

## 2020-11-21 ENCOUNTER — Other Ambulatory Visit: Payer: Self-pay | Admitting: Internal Medicine

## 2020-11-22 NOTE — Telephone Encounter (Signed)
PA is pending for patient

## 2020-11-24 NOTE — Telephone Encounter (Signed)
Two separate appeals have been sent after PA was denied

## 2020-11-25 ENCOUNTER — Encounter: Payer: Self-pay | Admitting: Family Medicine

## 2020-11-25 MED ORDER — ATORVASTATIN CALCIUM 40 MG PO TABS: ORAL_TABLET | ORAL | 0 refills | Status: DC

## 2020-12-06 NOTE — Telephone Encounter (Signed)
Confirmed with pharmacy they can still process rx for $10 copay while we wait for insurance determination (appeals has been pending for 2+ weeks).

## 2020-12-23 ENCOUNTER — Telehealth: Payer: Self-pay | Admitting: Pharmacist

## 2020-12-23 NOTE — Telephone Encounter (Signed)
Called pt to advise her that Eliquis has been added back to her insurance's formulary due to overwhelming demand from provider's offices. Her pharmacy can process Eliquis like normal the next time she is due for a refill. She was very appreciative for the assistance.

## 2021-02-14 ENCOUNTER — Ambulatory Visit: Payer: BC Managed Care – PPO | Admitting: Internal Medicine

## 2021-02-14 ENCOUNTER — Other Ambulatory Visit: Payer: Self-pay | Admitting: Family Medicine

## 2021-02-14 ENCOUNTER — Other Ambulatory Visit: Payer: Self-pay | Admitting: Gastroenterology

## 2021-02-14 ENCOUNTER — Other Ambulatory Visit: Payer: Self-pay

## 2021-02-14 ENCOUNTER — Encounter: Payer: Self-pay | Admitting: Internal Medicine

## 2021-02-14 VITALS — BP 124/76 | HR 48 | Ht 67.0 in | Wt 191.0 lb

## 2021-02-14 DIAGNOSIS — I480 Paroxysmal atrial fibrillation: Secondary | ICD-10-CM

## 2021-02-14 DIAGNOSIS — I10 Essential (primary) hypertension: Secondary | ICD-10-CM | POA: Diagnosis not present

## 2021-02-14 NOTE — Progress Notes (Deleted)
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 female who presents today for routine electrophysiology followup.  Since last being seen in our clinic, the patient reports doing very well.  Today, she denies symptoms of palpitations, chest pain, shortness of breath,  lower extremity edema, dizziness, presyncope, or syncope.  The patient is otherwise without complaint today.   Past Medical History:  Diagnosis Date  . Anxiety   . Atrial fibrillation ([place])   . Depression   . GERD (gastroesophageal reflux disease)   . Graves disease   . Hiatal hernia    a. seen on CT 12/2016.  Marland Kitchen Hyperlipidemia   . Hypertension   . NSVT (nonsustained ventricular tachycardia) ([place])   . Pulmonary nodules    a. seen on CT 12/2016.  . Stroke ([place])   . TIA (transient ischemic attack) 01/2017   a. tx at [facility].   Past Surgical History:  Procedure Laterality Date  . ATRIAL FIBRILLATION ABLATION N/A 09/12/2018   Procedure: ATRIAL FIBRILLATION ABLATION;  Surgeon: Thompson Grayer, MD;  Location: [facility];  Service: Cardiovascular;  Laterality: N/A;  . CARPAL TUNNEL RELEASE      ROS- all systems are reviewed and negatives except as per HPI above  Current Outpatient Medications  Medication Sig Dispense Refill  . acetaminophen (TYLENOL) 500 MG tablet Take 500 mg by mouth 2 (two) times daily as needed for moderate pain or headache.    . ALPRAZolam (XANAX) 0.5 MG tablet TAKE 1/2 TO 1 TABLET BY MOUTH TWICE A DAY AS NEEDED 30 tablet 1  . atorvastatin (LIPITOR) 40 MG tablet TAKE 1 TABLET BY MOUTH EVERY DAY IN THE EVENING 90 tablet 0  . DEXILANT 60 MG capsule TAKE 1 CAPSULE BY MOUTH EVERY DAY 30 capsule 2  . diltiazem (CARDIZEM CD) 240 MG 24 hr capsule TAKE 1 CAPSULE BY MOUTH EVERY DAY 90 capsule 2  . ELIQUIS 5 MG TABS tablet TAKE 1 TABLET BY MOUTH TWICE A DAY 60 tablet 6  . famotidine (PEPCID) 40 MG tablet Take 1 tablet (40 mg total) by mouth  at bedtime. 30 tablet 60  . flecainide (TAMBOCOR) 50 MG tablet TAKE 1 TABLET BY MOUTH TWICE A DAY 180 tablet 2  . levothyroxine (SYNTHROID, LEVOTHROID) 112 MCG tablet Take 112 mcg by mouth daily.  6  . oxymetazoline (AFRIN) 0.05 % nasal spray Place 1 spray into both nostrils as needed for congestion.     . pantoprazole (PROTONIX) 40 MG tablet TAKE 1 TABLET BY MOUTH TWICE A DAY 180 tablet 1  . Tetrahydrozoline HCl (VISINE OP) Place 1 drop into both eyes daily as needed (dry eyes).    Marland Kitchen diltiazem (CARDIZEM) 30 MG tablet Take 1 tablet (30 mg total) by mouth daily as needed. For palpitations/heart rate above 110bpm 90 tablet 1   No current facility-administered medications for this visit.    Physical Exam: Vitals:   02/14/21 1626  BP: 124/76  Pulse: (!) 48  SpO2: 98%  Weight: 191 lb (86.6 kg)  Height: 5\' 7"  (1.702 m)    GEN- The patient is well appearing, alert and oriented x 3 today.   Head- normocephalic, atraumatic Eyes-  Sclera clear, conjunctiva pink Ears- hearing intact Oropharynx- clear Lungs- Clear to ausculation bilaterally, normal work of breathing Heart- Regular rate and rhythm, no murmurs, rubs or gallops, PMI not laterally displaced GI- soft, NT, ND, + BS Extremities- no clubbing, cyanosis,  or edema  Wt Readings from Last 3 Encounters:  02/14/21 191 lb (86.6 kg)  08/13/20 185 lb 12.8 oz (84.3 kg)  01/14/20 191 lb (86.6 kg)    EKG tracing ordered today is personally reviewed and shows ***  Assessment and Plan:  1. Paroxysmal atrial fibrillation Doing very well since ablation in 2019.  afib is controlled.  Should her burden increase, she may want to consider repeat ablation. She is on flecainide.  We will follow her closely on this medicine to avoid toxicity chads2vasc score is 4.    2. HTN Stable No change required today  Return in 6 months  Thompson Grayer MD, [facility] 02/14/2021 4:28 PM

## 2021-02-14 NOTE — Patient Instructions (Addendum)
Medication Instructions:  Your physician recommends that you continue on your current medications as directed. Please refer to the Current Medication list given to you today.  Labwork: None ordered.  Testing/Procedures: None ordered.  Follow-Up:  Your physician wants you to follow-up in: 9 month with Thompson Grayer, MD    Any Other Special Instructions Will Be Listed Below (If Applicable).  If you need a refill on your cardiac medications before your next appointment, please call your pharmacy.

## 2021-02-14 NOTE — Progress Notes (Signed)
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 female who presents today for routine electrophysiology followup.  Since last being seen in our clinic, the patient reports doing reasonably well. She has had several episodes of palpitations prior to lying down at night but it soon after resolves. She is a Public relations account executive. Today, she denies symptoms of chest pain, shortness of breath,  lower extremity edema, dizziness, presyncope, or syncope.  The patient is otherwise without complaint today.   Past Medical History:  Diagnosis Date  . Anxiety   . Atrial fibrillation ([place])   . Depression   . GERD (gastroesophageal reflux disease)   . Graves disease   . Hiatal hernia    a. seen on CT 12/2016.  Marland Kitchen Hyperlipidemia   . Hypertension   . NSVT (nonsustained ventricular tachycardia) ([place])   . Pulmonary nodules    a. seen on CT 12/2016.  . Stroke ([place])   . TIA (transient ischemic attack) 01/2017   a. tx at [facility].   Past Surgical History:  Procedure Laterality Date  . ATRIAL FIBRILLATION ABLATION N/A 09/12/2018   Procedure: ATRIAL FIBRILLATION ABLATION;  Surgeon: Thompson Grayer, MD;  Location: [facility];  Service: Cardiovascular;  Laterality: N/A;  . CARPAL TUNNEL RELEASE      ROS- all systems are reviewed and negatives except as per HPI above  Current Outpatient Medications  Medication Sig Dispense Refill  . acetaminophen (TYLENOL) 500 MG tablet Take 500 mg by mouth 2 (two) times daily as needed for moderate pain or headache.    . ALPRAZolam (XANAX) 0.5 MG tablet TAKE 1/2 TO 1 TABLET BY MOUTH TWICE A DAY AS NEEDED 30 tablet 1  . atorvastatin (LIPITOR) 40 MG tablet TAKE 1 TABLET BY MOUTH EVERY DAY IN THE EVENING 90 tablet 0  . DEXILANT 60 MG capsule TAKE 1 CAPSULE BY MOUTH EVERY DAY 30 capsule 2  . diltiazem (CARDIZEM CD) 240 MG 24 hr capsule TAKE 1 CAPSULE BY MOUTH EVERY DAY 90 capsule 2  . ELIQUIS 5 MG TABS tablet TAKE 1 TABLET BY MOUTH  TWICE A DAY 60 tablet 6  . famotidine (PEPCID) 40 MG tablet Take 1 tablet (40 mg total) by mouth at bedtime. 30 tablet 60  . flecainide (TAMBOCOR) 50 MG tablet TAKE 1 TABLET BY MOUTH TWICE A DAY 180 tablet 2  . levothyroxine (SYNTHROID, LEVOTHROID) 112 MCG tablet Take 112 mcg by mouth daily.  6  . oxymetazoline (AFRIN) 0.05 % nasal spray Place 1 spray into both nostrils as needed for congestion.     . pantoprazole (PROTONIX) 40 MG tablet TAKE 1 TABLET BY MOUTH TWICE A DAY 180 tablet 1  . Tetrahydrozoline HCl (VISINE OP) Place 1 drop into both eyes daily as needed (dry eyes).    Marland Kitchen diltiazem (CARDIZEM) 30 MG tablet Take 1 tablet (30 mg total) by mouth daily as needed. For palpitations/heart rate above 110bpm 90 tablet 1   No current facility-administered medications for this visit.    Physical Exam: Vitals:   02/14/21 1626  BP: 124/76  Pulse: (!) 48  SpO2: 98%  Weight: 191 lb (86.6 kg)  Height: 5\' 7"  (1.702 m)    GEN- The patient is well appearing, alert and oriented x 3 today.   Head- normocephalic, atraumatic Eyes-  Sclera clear, conjunctiva pink Ears- hearing intact Oropharynx- clear Lungs- Clear to ausculation bilaterally, normal work of breathing Heart- Regular rate and  rhythm, no murmurs, rubs or gallops, PMI not laterally displaced GI- soft, NT, ND, + BS Extremities- no clubbing, cyanosis, or edema  Wt Readings from Last 3 Encounters:  02/14/21 191 lb (86.6 kg)  08/13/20 185 lb 12.8 oz (84.3 kg)  01/14/20 191 lb (86.6 kg)    EKG tracing ordered today is personally reviewed and shows sinus bradycardia with 1st degree AV block  Assessment and Plan:  1. Paroxysmal atrial fibrillation Doing very well since ablation in 2019.  afib is controlled.  Should her burden increase, she may want to consider repeat ablation. She is on flecainide.  We will follow her closely on this medicine to avoid toxicity chads2vasc score is 4.    2. HTN Stable No change required  today  Return in 9 months  I,Alexis Bryant,acting as a scribe for Thompson Grayer, MD.,have documented all relevant documentation on the behalf of Thompson Grayer, MD,as directed by  Thompson Grayer, MD while in the presence of Thompson Grayer, MD.  I, Thompson Grayer, MD, have reviewed all documentation for this visit. The documentation on 02/14/21 for the exam, diagnosis, procedures, and orders are all accurate and complete.   Thompson Grayer MD, [facility] 02/14/2021 4:33 PM

## 2021-02-21 ENCOUNTER — Ambulatory Visit: Payer: BC Managed Care – PPO | Admitting: Family Medicine

## 2021-02-21 ENCOUNTER — Encounter: Payer: Self-pay | Admitting: Family Medicine

## 2021-02-21 ENCOUNTER — Other Ambulatory Visit: Payer: Self-pay

## 2021-02-21 VITALS — BP 122/70 | HR 64 | Temp 98.2°F | Resp 18 | Ht 67.0 in | Wt 192.8 lb

## 2021-02-21 DIAGNOSIS — Z23 Encounter for immunization: Secondary | ICD-10-CM

## 2021-02-21 DIAGNOSIS — I4891 Unspecified atrial fibrillation: Secondary | ICD-10-CM | POA: Diagnosis not present

## 2021-02-21 DIAGNOSIS — E785 Hyperlipidemia, unspecified: Secondary | ICD-10-CM | POA: Diagnosis not present

## 2021-02-21 DIAGNOSIS — R002 Palpitations: Secondary | ICD-10-CM

## 2021-02-21 DIAGNOSIS — I10 Essential (primary) hypertension: Secondary | ICD-10-CM

## 2021-02-21 MED ORDER — ALPRAZOLAM 0.5 MG PO TABS: ORAL_TABLET | ORAL | 1 refills | Status: DC

## 2021-02-21 MED ORDER — ATORVASTATIN CALCIUM 40 MG PO TABS: 40.0000 mg | ORAL_TABLET | Freq: Every evening | ORAL | 3 refills | Status: DC

## 2021-02-21 NOTE — Patient Instructions (Signed)

## 2021-02-21 NOTE — Progress Notes (Signed)
Patient ID: Alexandria [name], female    DOB: 03/19/1961  Age: 60 y.o. MRN: [id number]    Subjective:  Subjective  HPI [name] presents for an office visit today.  She complains of sleep disturbance. She states that she has been sleeping elevated due to her Dx of GERD. She notes that she has been taking 60 mg of Dexilant PO Daily for her GERD, however it has not relieved her symptoms.  She also complains of minor bilateral LE swelling. She reports that she was on her feet all day, resulting in swelling. .she needs a colonoscopy but needs clearance from card due to eliquis.  She will also discuss this with GI.  She denies any chest pain, SOB, fever, abdominal pain, cough, chills, sore throat, dysuria, urinary incontinence, back pain, HA, or N/V/D at this time. She agrees to a shingle vaccine today. Review of Systems  Constitutional: Negative for chills, fatigue and fever.  HENT: Negative for congestion, ear pain, sinus pressure, sinus pain and sore throat.   Eyes: Negative for pain.  Respiratory: Negative for cough and shortness of breath.   Cardiovascular: Positive for leg swelling (Bilateral Minor ). Negative for chest pain and palpitations.  Gastrointestinal: Negative for abdominal pain, blood in stool, constipation, diarrhea, nausea and vomiting.  Genitourinary: Negative for dysuria, frequency, hematuria and urgency.  Musculoskeletal: Negative for back pain.  Allergic/Immunologic: Negative for environmental allergies.  Neurological: Negative for dizziness, weakness and headaches.  Psychiatric/Behavioral: Positive for sleep disturbance (Secondary to GERD).    History Past Medical History:  Diagnosis Date  . Anxiety   . Atrial fibrillation ([place])   . Depression   . GERD (gastroesophageal reflux disease)   . Graves disease   . Hiatal hernia    a. seen on CT 12/2016.  Marland Kitchen Hyperlipidemia   . Hypertension   . NSVT (nonsustained ventricular tachycardia) ([place])   . Pulmonary nodules    a. seen  on CT 12/2016.  . Stroke ([place])   . TIA (transient ischemic attack) 01/2017   a. tx at [facility].    She has a past surgical history that includes Carpal tunnel release and ATRIAL FIBRILLATION ABLATION (N/A, 09/12/2018).   Her family history includes Atrial fibrillation in her father; Colon polyps in her brother; Coronary artery disease in her father, paternal grandfather, and paternal uncle; Dementia in her mother; Diabetes in her maternal grandmother; Hyperlipidemia in her mother.She reports that she has never smoked. She has never used smokeless tobacco. She reports current alcohol use of about 1.0 standard drink of alcohol per week. She reports that she does not use drugs.  Current Outpatient Medications on File Prior to Visit  Medication Sig Dispense Refill  . acetaminophen (TYLENOL) 500 MG tablet Take 500 mg by mouth 2 (two) times daily as needed for moderate pain or headache.    . DEXILANT 60 MG capsule TAKE 1 CAPSULE BY MOUTH EVERY DAY 30 capsule 2  . diltiazem (CARDIZEM CD) 240 MG 24 hr capsule TAKE 1 CAPSULE BY MOUTH EVERY DAY 90 capsule 2  . diltiazem (CARDIZEM) 30 MG tablet Take 1 tablet (30 mg total) by mouth daily as needed. For palpitations/heart rate above 110bpm 90 tablet 1  . ELIQUIS 5 MG TABS tablet TAKE 1 TABLET BY MOUTH TWICE A DAY 60 tablet 6  . famotidine (PEPCID) 40 MG tablet Take 1 tablet (40 mg total) by mouth at bedtime. 30 tablet 60  . flecainide (TAMBOCOR) 50 MG tablet TAKE 1 TABLET BY MOUTH  TWICE A DAY 180 tablet 2  . levothyroxine (SYNTHROID, LEVOTHROID) 112 MCG tablet Take 112 mcg by mouth daily.  6  . oxymetazoline (AFRIN) 0.05 % nasal spray Place 1 spray into both nostrils as needed for congestion.     . pantoprazole (PROTONIX) 40 MG tablet TAKE 1 TABLET BY MOUTH TWICE A DAY 180 tablet 1  . Tetrahydrozoline HCl (VISINE OP) Place 1 drop into both eyes daily as needed (dry eyes).     No current facility-administered medications on file prior to visit.      Objective:  Objective  Physical Exam Vitals and nursing note reviewed.  Constitutional:      General: She is not in acute distress.    Appearance: Normal appearance. She is well-developed. She is not ill-appearing.  HENT:     Head: Normocephalic and atraumatic.     Right Ear: External ear normal.     Left Ear: External ear normal.     Nose: Nose normal.  Eyes:     Extraocular Movements: Extraocular movements intact.     Pupils: Pupils are equal, round, and reactive to light.  Cardiovascular:     Rate and Rhythm: Normal rate and regular rhythm.     Pulses: Normal pulses.     Heart sounds: Normal heart sounds. No murmur heard. No friction rub. No gallop.   Pulmonary:     Effort: Pulmonary effort is normal. No respiratory distress.     Breath sounds: Normal breath sounds. No stridor. No wheezing, rhonchi or rales.  Abdominal:     General: Bowel sounds are normal. There is no distension.     Palpations: Abdomen is soft. There is no mass.     Tenderness: There is no abdominal tenderness. There is no guarding or rebound.     Hernia: No hernia is present.  Musculoskeletal:        General: Normal range of motion.     Cervical back: Normal range of motion and neck supple.  Skin:    General: Skin is warm and dry.  Neurological:     Mental Status: She is alert and oriented to person, place, and time.  Psychiatric:        Behavior: Behavior normal.        Thought Content: Thought content normal.    BP 122/70 (BP Location: Left Arm, Patient Position: Sitting, Cuff Size: Normal)   Pulse 64   Temp 98.2 F (36.8 C) (Oral)   Resp 18   Ht 5\' 7"  (1.702 m)   Wt 192 lb 12.8 oz (87.5 kg)   LMP 01/26/2012   SpO2 97%   BMI 30.20 kg/m  Wt Readings from Last 3 Encounters:  02/21/21 192 lb 12.8 oz (87.5 kg)  02/14/21 191 lb (86.6 kg)  08/13/20 185 lb 12.8 oz (84.3 kg)     Lab Results  Component Value Date   WBC 6.5 01/14/2020   HGB 11.7 01/14/2020   HCT 36.3 01/14/2020   PLT 299  01/14/2020   GLUCOSE 126 (H) 01/14/2020   CHOL 169 11/27/2011   TRIG 180.0 (H) 11/27/2011   HDL 36.20 (L) 11/27/2011   LDLCALC 97 11/27/2011   ALT 17 04/22/2018   AST 12 04/22/2018   NA 141 01/14/2020   K 4.0 01/14/2020   CL 105 01/14/2020   CREATININE 1.20 (H) 01/14/2020   BUN 17 01/14/2020   CO2 22 01/14/2020   TSH 0.65 04/22/2018   HGBA1C 6.1 04/22/2018    No results found.  Assessment & Plan:  Plan    Meds ordered this encounter  Medications  . ALPRAZolam (XANAX) 0.5 MG tablet    Sig: TAKE 1/2 TO 1 TABLET BY MOUTH TWICE A DAY AS NEEDED    Dispense:  30 tablet    Refill:  1  . atorvastatin (LIPITOR) 40 MG tablet    Sig: Take 1 tablet (40 mg total) by mouth every evening.    Dispense:  90 tablet    Refill:  3    Problem List Items Addressed This Visit      Unprioritized   Atrial fibrillation with RVR ([place])    On eliquis Per cardiology      Relevant Medications   atorvastatin (LIPITOR) 40 MG tablet   Essential hypertension    Well controlled, no changes to meds. Encouraged heart healthy diet such as the DASH diet and exercise as tolerated.       Relevant Medications   atorvastatin (LIPITOR) 40 MG tablet   HLD (hyperlipidemia) - Primary    Check labs  Tolerating statin, encouraged heart healthy diet, avoid trans fats, minimize simple carbs and saturated fats. Increase exercise as tolerated      Relevant Medications   atorvastatin (LIPITOR) 40 MG tablet   Other Relevant Orders   Lipid panel   Comprehensive metabolic panel    Other Visit Diagnoses    Need for shingles vaccine       Relevant Orders   Varicella-zoster vaccine IM (Completed)   Palpitations       Relevant Medications   ALPRAZolam (XANAX) 0.5 MG tablet      Follow-up: Return in about 2 months (around 04/23/2021) for for shingrix #2 and 6 mon for cpe.   I,Gordon Zheng,acting as a Education administrator for [employer], DO.,have documented all relevant documentation on the behalf of [name], DO,as directed by  [name], DO while in the presence of [place], DO, have reviewed all documentation for this visit. The documentation on 02/21/21 for the exam, diagnosis, procedures, and orders are all accurate and complete.

## 2021-02-21 NOTE — Assessment & Plan Note (Signed)
Well controlled, no changes to meds. Encouraged heart healthy diet such as the DASH diet and exercise as tolerated.  °

## 2021-02-21 NOTE — Assessment & Plan Note (Signed)
On eliquis Per cardiology

## 2021-02-21 NOTE — Assessment & Plan Note (Signed)
Check labs  Tolerating statin, encouraged heart healthy diet, avoid trans fats, minimize simple carbs and saturated fats. Increase exercise as tolerated

## 2021-02-22 LAB — COMPREHENSIVE METABOLIC PANEL
ALT: 16 U/L (ref 0–35)
AST: 14 U/L (ref 0–37)
Albumin: 3.8 g/dL (ref 3.5–5.2)
Alkaline Phosphatase: 113 U/L (ref 39–117)
BUN: 17 mg/dL (ref 6–23)
CO2: 28 mEq/L (ref 19–32)
Calcium: 8.7 mg/dL (ref 8.4–10.5)
Chloride: 106 mEq/L (ref 96–112)
Creatinine, Ser: 0.96 mg/dL (ref 0.40–1.20)
GFR: 63.71 mL/min (ref >60.00)
Glucose, Bld: 98 mg/dL (ref 70–99)
Potassium: 3.3 mEq/L — ABNORMAL LOW (ref 3.5–5.1)
Sodium: 141 mEq/L (ref 135–145)
Total Bilirubin: 0.3 mg/dL (ref 0.2–1.2)
Total Protein: 6.7 g/dL (ref 6.0–8.3)

## 2021-02-22 LAB — LIPID PANEL
Cholesterol: 112 mg/dL (ref 0–200)
HDL: 32.1 mg/dL — ABNORMAL LOW (ref >39.00)
LDL Cholesterol: 54 mg/dL (ref 0–99)
NonHDL: 80.13
Total CHOL/HDL Ratio: 3
Triglycerides: 132 mg/dL (ref 0.0–149.0)
VLDL: 26.4 mg/dL (ref 0.0–40.0)

## 2021-03-08 ENCOUNTER — Telehealth: Payer: Self-pay

## 2021-03-08 ENCOUNTER — Encounter: Payer: Self-pay | Admitting: Gastroenterology

## 2021-03-08 ENCOUNTER — Other Ambulatory Visit: Payer: Self-pay

## 2021-03-08 ENCOUNTER — Ambulatory Visit (INDEPENDENT_AMBULATORY_CARE_PROVIDER_SITE_OTHER): Payer: BC Managed Care – PPO | Admitting: Gastroenterology

## 2021-03-08 VITALS — BP 132/78 | HR 66 | Ht 67.0 in | Wt 191.1 lb

## 2021-03-08 DIAGNOSIS — K219 Gastro-esophageal reflux disease without esophagitis: Secondary | ICD-10-CM

## 2021-03-08 DIAGNOSIS — Z1211 Encounter for screening for malignant neoplasm of colon: Secondary | ICD-10-CM

## 2021-03-08 MED ORDER — CLENPIQ 10-3.5-12 MG-GM -GM/160ML PO SOLN: 1.0000 | Freq: Once | ORAL | 0 refills | Status: AC

## 2021-03-08 MED ORDER — PANTOPRAZOLE SODIUM 40 MG PO TBEC: 40.0000 mg | DELAYED_RELEASE_TABLET | Freq: Every day | ORAL | 3 refills | Status: DC

## 2021-03-08 NOTE — Progress Notes (Signed)
Chief Complaint: Reflux  Referring Provider:  Carollee Herter, Alferd Apa, *      ASSESSMENT AND PLAN;   #1. GERD with mod HH (on chest CT)  #2.  Colorectal cancer screening/FH of colonic polyps (mom and brother)  #3.  Comorbid conditions include A Fib on eliquis (EF 60-65%), situational anxiety/depression, Graves' disease, HLD, HTN, history of TIA  Plan:  -Protonix 40mg  po bid (#60) 1/2 hr before breakfast and before supper. -Pepcid 40 mg p.o. QHS #30,  -Recommend EGD and colonoscopy with Miralax off Eliquis 24hrs before, after cardiology clearance. -If still with problems, will give her a trial of GI cocktail.   HPI:    Alexandria [name] is a 60 y.o. female  With significant reflux symptoms for last 1 year, with water brash, regurgitation, burning sensation coming all the way up to the throat, especially at night without any odynophagia or dysphagia.  This is causing significant problems to her.  Interfering with her lifestyle as well.  She is quite upset about the symptoms.  This is despite Dexilant every day.  She has been on Dexilant for last 1 year.  Not helping.   No nonsteroidals.  No sodas, chocolates, chewing gums and candy.   Had cardiac CT which showed moderate hiatal hernia.  She also had significant heartburn during pregnancy several years ago.  No melena or hematochezia.  Never had EGD or colonoscopy.  Brother had polyps at age 34, mom>60  SH- Pharmacist, hospital Past Medical History:  Diagnosis Date  . Anxiety   . Atrial fibrillation ([place])   . Depression   . GERD (gastroesophageal reflux disease)   . Graves disease   . Hiatal hernia    a. seen on CT 12/2016.  Marland Kitchen Hyperlipidemia   . Hypertension   . NSVT (nonsustained ventricular tachycardia) ([place])   . Pulmonary nodules    a. seen on CT 12/2016.  . Stroke ([place])   . TIA (transient ischemic attack) 01/2017   a. tx at [facility].    Past Surgical History:  Procedure Laterality Date  . ATRIAL FIBRILLATION ABLATION  N/A 09/12/2018   Procedure: ATRIAL FIBRILLATION ABLATION;  Surgeon: Thompson Grayer, MD;  Location: [facility];  Service: Cardiovascular;  Laterality: N/A;  . CARPAL TUNNEL RELEASE      Family History  Problem Relation Age of Onset  . Coronary artery disease Father   . Atrial fibrillation Father   . Diabetes Maternal Grandmother   . Hyperlipidemia Mother   . Dementia Mother   . Coronary artery disease Paternal Grandfather   . Coronary artery disease Paternal Uncle   . Colon polyps Brother        says he goes every year to have a colonoscopy  . Colon cancer Neg Hx   . Esophageal cancer Neg Hx     Social History   Tobacco Use  . Smoking status: Never Smoker  . Smokeless tobacco: Never Used  Vaping Use  . Vaping Use: Never used  Substance Use Topics  . Alcohol use: Yes    Alcohol/week: 1.0 standard drink    Types: 1 Glasses of wine per week    Comment: occasionally,   . Drug use: No    Current Outpatient Medications  Medication Sig Dispense Refill  . acetaminophen (TYLENOL) 500 MG tablet Take 500 mg by mouth 2 (two) times daily as needed for moderate pain or headache.    . ALPRAZolam (XANAX) 0.5 MG tablet TAKE 1/2 TO 1 TABLET  BY MOUTH TWICE A DAY AS NEEDED 30 tablet 1  . atorvastatin (LIPITOR) 40 MG tablet Take 1 tablet (40 mg total) by mouth every evening. 90 tablet 3  . diltiazem (CARDIZEM CD) 240 MG 24 hr capsule TAKE 1 CAPSULE BY MOUTH EVERY DAY 90 capsule 2  . ELIQUIS 5 MG TABS tablet TAKE 1 TABLET BY MOUTH TWICE A DAY 60 tablet 6  . famotidine (PEPCID) 40 MG tablet Take 1 tablet (40 mg total) by mouth at bedtime. 30 tablet 60  . flecainide (TAMBOCOR) 50 MG tablet TAKE 1 TABLET BY MOUTH TWICE A DAY 180 tablet 2  . levothyroxine (SYNTHROID, LEVOTHROID) 112 MCG tablet Take 112 mcg by mouth daily.  6  . oxymetazoline (AFRIN) 0.05 % nasal spray Place 1 spray into both nostrils as needed for congestion.     . pantoprazole (PROTONIX) 40 MG tablet TAKE 1 TABLET BY MOUTH  TWICE A DAY 180 tablet 1  . Tetrahydrozoline HCl (VISINE OP) Place 1 drop into both eyes daily as needed (dry eyes).    Marland Kitchen diltiazem (CARDIZEM) 30 MG tablet Take 1 tablet (30 mg total) by mouth daily as needed. For palpitations/heart rate above 110bpm 90 tablet 1   No current facility-administered medications for this visit.    Allergies  Allergen Reactions  . Peanut-Containing Drug Products Swelling    Review of Systems:  Constitutional: Denies fever, chills, diaphoresis, appetite change and fatigue.  HEENT: Denies photophobia, eye pain, redness, hearing loss, ear pain, congestion, sore throat, rhinorrhea, sneezing, mouth sores, neck pain, neck stiffness and tinnitus.   Respiratory: Denies SOB, DOE, cough, chest tightness,  and wheezing.   Cardiovascular: Denies chest pain, palpitations and leg swelling.  Genitourinary: Denies dysuria, urgency, frequency, hematuria, flank pain and difficulty urinating.  Musculoskeletal: Denies myalgias, back pain, joint swelling, arthralgias and gait problem.  Skin: No rash.  Neurological: Denies dizziness, seizures, syncope, weakness, light-headedness, numbness and headaches.  Hematological: Denies adenopathy. Easy bruising, personal or family bleeding history  Psychiatric/Behavioral: has anxiety or depression     Physical Exam:    BP 132/78 (BP Location: Left Arm, Patient Position: Sitting, Cuff Size: Normal)   Pulse 66   Ht 5\' 7"  (1.702 m)   Wt 191 lb 2 oz (86.7 kg)   LMP 01/26/2012   BMI 29.93 kg/m  Filed Weights   03/08/21 1553  Weight: 191 lb 2 oz (86.7 kg)   Gen: awake, alert, NAD HEENT: anicteric, no pallor CV: RRR, no mrg Pulm: CTA b/l Abd: soft, NT/ND, +BS throughout Ext: no c/c/e Neuro: nonfocal   CBC Latest Ref Rng & Units 01/14/2020 09/05/2018 04/04/2018  WBC 3.4 - 10.8 x10E3/uL 6.5 7.8 8.0  Hemoglobin 11.1 - 15.9 g/dL 11.7 12.7 14.1  Hematocrit 34.0 - 46.6 % 36.3 38.0 40.6  Platelets 150 - 450 x10E3/uL 299 308 292    CMP Latest Ref Rng & Units 02/21/2021 01/14/2020 09/05/2018  Glucose 70 - 99 mg/dL 98 126(H) 86  BUN 6 - 23 mg/dL 17 17 16   Creatinine 0.40 - 1.20 mg/dL 0.96 1.20(H) 0.75  Sodium 135 - 145 mEq/L 141 141 142  Potassium 3.5 - 5.1 mEq/L 3.3(L) 4.0 3.9  Chloride 96 - 112 mEq/L 106 105 103  CO2 19 - 32 mEq/L 28 22 24   Calcium 8.4 - 10.5 mg/dL 8.7 9.0 9.1  Total Protein 6.0 - 8.3 g/dL 6.7 - -  Total Bilirubin 0.2 - 1.2 mg/dL 0.3 - -  Alkaline Phos 39 - 117 U/L 113 - -  AST 0 - 37 U/L 14 - -  ALT 0 - 35 U/L 16 - -       Carmell Austria, MD 03/08/2021, 4:00 PM  Cc: Carollee Herter, Alferd Apa, *

## 2021-03-08 NOTE — Telephone Encounter (Signed)
[facility] Pre-operative Risk Assessment     Request for surgical clearance:     Endoscopy Procedure  What type of surgery is being performed?     Endo/colon  When is this surgery scheduled?     04/20/2021  What type of clearance is required ?   Pharmacy  Are there any medications that need to be held prior to surgery and how long? Eliquis - 24 hours  Practice name and name of physician performing surgery?      [facility]  What is your office phone and fax number?      Phone- [phone]  [phone]  Anesthesia type (None, local, MAC, general) ?       MAC

## 2021-03-08 NOTE — Patient Instructions (Signed)
We have sent the following medications to your pharmacy for you to pick up at your convenience:  Protonix, Pepcid  You have been scheduled for an endoscopy and colonoscopy. Please follow the written instructions given to you at your visit today. Please pick up your prep supplies at the pharmacy within the next 1-3 days. If you use inhalers (even only as needed), please bring them with you on the day of your procedure.  You will be contacted by our office prior to your procedure for directions on holding your Eliquis.  If you do not hear from our office 1 week prior to your scheduled procedure, please call [phone] to discuss.

## 2021-03-09 ENCOUNTER — Encounter: Payer: Self-pay | Admitting: Gastroenterology

## 2021-03-09 NOTE — Telephone Encounter (Signed)
Patient with diagnosis of afib on Eliquis for anticoagulation.    Procedure: endoscopy/colonoscopy Date of procedure: 04/20/21  CHA2DS2-VASc Score = 4  This indicates a 4.8% annual risk of stroke. The patient's score is based upon: CHF History: No HTN History: Yes Diabetes History: No Stroke History: Yes Vascular Disease History: No Age Score: 0 Gender Score: 1     CrCl 64.3 ml/min Platelet count 299  Per office protocol, patient can hold Eliquis for 1 day prior to procedure.

## 2021-03-09 NOTE — Telephone Encounter (Signed)
   Name: [name]  DOB: Sep 25, 1961  MRN: [id number]   Primary Cardiologist: None  Chart reviewed as part of pre-operative protocol coverage. We have been asked for guidance to hold eliquis. Per our clinical pharmacist:  Patient with diagnosis of afib on Eliquis for anticoagulation.    Procedure: endoscopy/colonoscopy Date of procedure: 04/20/21  CHA2DS2-VASc Score = 4  This indicates a 4.8% annual risk of stroke. The patient's score is based upon: CHF History: No HTN History: Yes Diabetes History: No Stroke History: Yes Vascular Disease History: No Age Score: 0 Gender Score: 1     CrCl 64.3 ml/min Platelet count 299  Per office protocol, patient can hold Eliquis for 1 day prior to procedure   I will route this recommendation to the requesting party via [employer] fax function and remove from pre-op pool. Please call with questions.  [place], PA 03/09/2021, 7:58 AM

## 2021-03-14 NOTE — Telephone Encounter (Signed)
LVM for patient to call back to be aware of holding her Eliquis 1 day prior to procedure

## 2021-03-16 ENCOUNTER — Other Ambulatory Visit: Payer: Self-pay | Admitting: Gastroenterology

## 2021-04-01 ENCOUNTER — Telehealth: Payer: Self-pay | Admitting: Gastroenterology

## 2021-04-01 NOTE — Telephone Encounter (Signed)
LVM for patient to call back. ?

## 2021-04-01 NOTE — Telephone Encounter (Signed)
Inbound call from patient returning your call. 

## 2021-04-01 NOTE — Telephone Encounter (Signed)
Pt stated if she can something stronger for her reflux and prescription she has isn't working at the moment. She would like to have instructions for what to take before her procedure on 04/20/21. Please give her a call. Thank you

## 2021-04-01 NOTE — Telephone Encounter (Signed)
Patient said that she would like something stronger than her Protonix 40mg  bid and Pepcid that she takes at night because she is still having symptoms even at night

## 2021-04-19 NOTE — Telephone Encounter (Signed)
Lets try Dexilant 60 mg p.o. once a day #30, 2 refills She can continue taking Pepcid 20 mg p.o. nightly x 2 weeks, then can stop RG

## 2021-04-19 NOTE — Telephone Encounter (Signed)
Patient said that she has tried Dexilant in the past as well. She is having a procedure with you tomorrow and she will talk with you then about trying another medication

## 2021-04-20 ENCOUNTER — Encounter: Payer: Self-pay | Admitting: Gastroenterology

## 2021-04-20 ENCOUNTER — Ambulatory Visit (AMBULATORY_SURGERY_CENTER): Payer: BC Managed Care – PPO | Admitting: Gastroenterology

## 2021-04-20 ENCOUNTER — Other Ambulatory Visit: Payer: Self-pay

## 2021-04-20 VITALS — BP 133/66 | HR 58 | Temp 98.7°F | Resp 14 | Ht 67.0 in | Wt 191.0 lb

## 2021-04-20 DIAGNOSIS — D128 Benign neoplasm of rectum: Secondary | ICD-10-CM

## 2021-04-20 DIAGNOSIS — K449 Diaphragmatic hernia without obstruction or gangrene: Secondary | ICD-10-CM | POA: Diagnosis not present

## 2021-04-20 DIAGNOSIS — K635 Polyp of colon: Secondary | ICD-10-CM | POA: Diagnosis not present

## 2021-04-20 DIAGNOSIS — K317 Polyp of stomach and duodenum: Secondary | ICD-10-CM

## 2021-04-20 DIAGNOSIS — D127 Benign neoplasm of rectosigmoid junction: Secondary | ICD-10-CM

## 2021-04-20 DIAGNOSIS — K621 Rectal polyp: Secondary | ICD-10-CM

## 2021-04-20 DIAGNOSIS — R933 Abnormal findings on diagnostic imaging of other parts of digestive tract: Secondary | ICD-10-CM

## 2021-04-20 DIAGNOSIS — Z1211 Encounter for screening for malignant neoplasm of colon: Secondary | ICD-10-CM

## 2021-04-20 DIAGNOSIS — K2100 Gastro-esophageal reflux disease with esophagitis, without bleeding: Secondary | ICD-10-CM

## 2021-04-20 DIAGNOSIS — D125 Benign neoplasm of sigmoid colon: Secondary | ICD-10-CM

## 2021-04-20 DIAGNOSIS — K2970 Gastritis, unspecified, without bleeding: Secondary | ICD-10-CM

## 2021-04-20 MED ORDER — SODIUM CHLORIDE 0.9 % IV SOLN: 500.0000 mL | Freq: Once | INTRAVENOUS | Status: DC

## 2021-04-20 NOTE — Op Note (Signed)
[place] Patient Name: Alexandria [name] Procedure Date: 04/20/2021 2:01 PM MRN: [id number] Endoscopist: Jackquline Denmark , MD Age: 60 Referring MD:  Date of Birth: [birth year] Gender: Female Account #: [id number] Procedure:                Colonoscopy Indications:              Screening for colorectal malignant neoplasm Medicines:                Monitored Anesthesia Care Procedure:                Pre-Anesthesia Assessment:                           - Prior to the procedure, a History and Physical                            was performed, and patient medications and                            allergies were reviewed. The patient's tolerance of                            previous anesthesia was also reviewed. The risks                            and benefits of the procedure and the sedation                            options and risks were discussed with the patient.                            All questions were answered, and informed consent                            was obtained. Prior Anticoagulants: The patient has                            taken Eliquis (apixaban), last dose was 1 day prior                            to procedure. ASA Grade Assessment: II - A patient                            with mild systemic disease. After reviewing the                            risks and benefits, the patient was deemed in                            satisfactory condition to undergo the procedure.                           After obtaining informed consent, the colonoscope  was passed under direct vision. Throughout the                            procedure, the patient's blood pressure, pulse, and                            oxygen saturations were monitored continuously. The                            Olympus CF-HQ190 [id number]) Colonoscope was                            introduced through the anus and advanced to the 2                            cm into the ileum.  The colonoscopy was performed                            without difficulty. The patient tolerated the                            procedure well. The quality of the bowel                            preparation was good. The terminal ileum, ileocecal                            valve, appendiceal orifice, and rectum were                            photographed. Scope In: 2:19:26 PM Scope Out: 2:35:27 PM Scope Withdrawal Time: 0 hours 9 minutes 40 seconds  Total Procedure Duration: 0 hours 16 minutes 1 second  Findings:                 Three sessile polyps were found in the rectum and                            mid sigmoid colon(2). The polyps were 6 mm in size.                            These polyps were removed with a cold snare.                            Resection and retrieval were complete.                           Non-bleeding internal hemorrhoids were found during                            retroflexion. The hemorrhoids were small.                           The terminal ileum appeared normal.  The exam was otherwise without abnormality on                            direct and retroflexion views. Complications:            No immediate complications. Estimated Blood Loss:     Estimated blood loss: none. Impression:               - Three 6 mm polyps in the rectum and in the mid                            sigmoid colon, removed with a cold snare. Resected                            and retrieved.                           - Non-bleeding internal hemorrhoids.                           - The examined portion of the ileum was normal.                           - The examination was otherwise normal on direct                            and retroflexion views. Recommendation:           - Patient has a contact number available for                            emergencies. The signs and symptoms of potential                            delayed complications were  discussed with the                            patient. Return to normal activities tomorrow.                            Written discharge instructions were provided to the                            patient.                           - Resume previous diet.                           - Continue present medications.                           - Await pathology results.                           - Repeat colonoscopy for surveillance based on  pathology results.                           - Resume Eliquis (apixaban) at prior dose tomorrow.                           - The findings and recommendations were discussed                            with the patient's family. Jackquline Denmark, MD 04/20/2021 2:45:00 PM This report has been signed electronically.

## 2021-04-20 NOTE — Progress Notes (Signed)
PT taken to PACU. Monitors in place. VSS. Report given to RN. 

## 2021-04-20 NOTE — Op Note (Signed)
[place] Patient Name: Alexandria [name] Procedure Date: 04/20/2021 2:02 PM MRN: [id number] Endoscopist: Jackquline Denmark , MD Age: 60 Referring MD:  Date of Birth: Feb 04, 1961 Gender: Female Account #: [id number] Procedure:                Upper GI endoscopy Indications:              H/O heartburn. Abnormal CT chest showing hiatal                            hernia. Medicines:                Monitored Anesthesia Care Procedure:                Pre-Anesthesia Assessment:                           - Prior to the procedure, a History and Physical                            was performed, and patient medications and                            allergies were reviewed. The patient's tolerance of                            previous anesthesia was also reviewed. The risks                            and benefits of the procedure and the sedation                            options and risks were discussed with the patient.                            All questions were answered, and informed consent                            was obtained. Prior Anticoagulants: The patient has                            taken Eliquis (apixaban), last dose was 1 day prior                            to procedure. ASA Grade Assessment: II - A patient                            with mild systemic disease. After reviewing the                            risks and benefits, the patient was deemed in                            satisfactory condition to undergo the procedure.  After obtaining informed consent, the endoscope was                            passed under direct vision. Throughout the                            procedure, the patient's blood pressure, pulse, and                            oxygen saturations were monitored continuously. The                            Endoscope was introduced through the mouth, and                            advanced to the second part of duodenum. The  upper                            GI endoscopy was accomplished without difficulty.                            The patient tolerated the procedure well. Scope In: Scope Out: Findings:                 The examined esophagus was mildly torturous and                            foreshortened d/t HH but normal. Biopsies were                            obtained from the proximal and distal esophagus                            with cold forceps for histology to r/o eosinophilic                            esophagitis.                           A 4 cm hiatal hernia was present (extending from 35                            up to 39 cm).                           A single 8 mm sessile polyp with no stigmata of                            recent bleeding was found on the lesser curvature                            of the gastric body. The polyp was removed with a  hot snare. Resection and retrieval were complete.                           Localized minimal inflammation characterized by                            erythema was found in the gastric antrum. Biopsies                            were taken with a cold forceps for histology.                           The examined duodenum was normal. Biopsies for                            histology were taken with a cold forceps for                            evaluation of celiac disease. Complications:            No immediate complications. Estimated Blood Loss:     Estimated blood loss: none. Impression:               - Moderate hiatal hernia.                           - A single gastric polyp. Resected and retrieved.                           - Gastritis. Biopsied. Recommendation:           - Patient has a contact number available for                            emergencies. The signs and symptoms of potential                            delayed complications were discussed with the                            patient. Return to  normal activities tomorrow.                            Written discharge instructions were provided to the                            patient.                           - Resume previous diet.                           - Continue present medications.                           - Avoid ibuprofen, naproxen, or other non-steroidal  anti-inflammatory drugs.                           - Await pathology results.                           - Follow an antireflux regimen.                           - The findings and recommendations were discussed                            with the patient's family. Jackquline Denmark, MD 04/20/2021 2:42:02 PM This report has been signed electronically.

## 2021-04-20 NOTE — Progress Notes (Signed)
Called to room to assist during endoscopic procedure.  Patient ID and intended procedure confirmed with present staff. Received instructions for my participation in the procedure from the performing physician.  

## 2021-04-20 NOTE — Progress Notes (Signed)
CW vitals and Ph IV.

## 2021-04-20 NOTE — Patient Instructions (Signed)
Resume Eliquis at prior dose tomorrow. Avoid ibuprofen, aleve and aspirin whenever possible.  Follow your anti-reflux instructions.  YOU HAD AN ENDOSCOPIC PROCEDURE TODAY AT [facility]:   Refer to the procedure report that was given to you for any specific questions about what was found during the examination.  If the procedure report does not answer your questions, please call your gastroenterologist to clarify.  If you requested that your care partner not be given the details of your procedure findings, then the procedure report has been included in a sealed envelope for you to review at your convenience later.  YOU SHOULD EXPECT: Some feelings of bloating in the abdomen. Passage of more gas than usual.  Walking can help get rid of the air that was put into your GI tract during the procedure and reduce the bloating. If you had a lower endoscopy (such as a colonoscopy or flexible sigmoidoscopy) you may notice spotting of blood in your stool or on the toilet paper. If you underwent a bowel prep for your procedure, you may not have a normal bowel movement for a few days.  Please Note:  You might notice some irritation and congestion in your nose or some drainage.  This is from the oxygen used during your procedure.  There is no need for concern and it should clear up in a day or so.  SYMPTOMS TO REPORT IMMEDIATELY:  Following lower endoscopy (colonoscopy or flexible sigmoidoscopy):  Excessive amounts of blood in the stool  Significant tenderness or worsening of abdominal pains  Swelling of the abdomen that is new, acute  Fever of 100F or higher  Following upper endoscopy (EGD)  Vomiting of blood or coffee ground material  New chest pain or pain under the shoulder blades  Painful or persistently difficult swallowing  New shortness of breath  Fever of 100F or higher  Black, tarry-looking stools  For urgent or emergent issues, a gastroenterologist can be reached at any hour by  calling [phone]. Do not use MyChart messaging for urgent concerns.    DIET:  We do recommend a small meal at first, but then you may proceed to your regular diet.  Drink plenty of fluids but you should avoid alcoholic beverages for 24 hours.  ACTIVITY:  You should plan to take it easy for the rest of today and you should NOT DRIVE or use heavy machinery until tomorrow (because of the sedation medicines used during the test).    FOLLOW UP: Our staff will call the number listed on your records 48-72 hours following your procedure to check on you and address any questions or concerns that you may have regarding the information given to you following your procedure. If we do not reach you, we will leave a message.  We will attempt to reach you two times.  During this call, we will ask if you have developed any symptoms of COVID 19. If you develop any symptoms (ie: fever, flu-like symptoms, shortness of breath, cough etc.) before then, please call [phone].  If you test positive for Covid 19 in the 2 weeks post procedure, please call and report this information to [place].    If any biopsies were taken you will be contacted by phone or by letter within the next 1-3 weeks.  Please call us at [phone] if you have not heard about the biopsies in 3 weeks.    SIGNATURES/CONFIDENTIALITY: You and/or your care partner have signed paperwork which will be entered into  your electronic medical record.  These signatures attest to the fact that that the information above on your After Visit Summary has been reviewed and is understood.  Full responsibility of the confidentiality of this discharge information lies with you and/or your care-partner.

## 2021-04-22 ENCOUNTER — Telehealth: Payer: Self-pay

## 2021-04-22 NOTE — Telephone Encounter (Signed)
  Follow up Call-  Call back number 04/20/2021  Post procedure Call Back phone  # [phone]  Permission to leave phone message Yes  Some recent data might be hidden     Patient questions:  Do you have a fever, pain , or abdominal swelling? No. Pain Score  0 *  Have you tolerated food without any problems? Yes.    Have you been able to return to your normal activities? Yes.    Do you have any questions about your discharge instructions: Diet   No. Medications  No. Follow up visit  No.  Do you have questions or concerns about your Care? No.  Actions: * If pain score is 4 or above: No action needed, pain <4.

## 2021-04-22 NOTE — Telephone Encounter (Signed)
Attempted f/u call. No answer left VM.  

## 2021-04-27 ENCOUNTER — Encounter: Payer: Self-pay | Admitting: Gastroenterology

## 2021-05-04 ENCOUNTER — Ambulatory Visit: Payer: BC Managed Care – PPO

## 2021-05-04 ENCOUNTER — Ambulatory Visit (INDEPENDENT_AMBULATORY_CARE_PROVIDER_SITE_OTHER): Payer: BC Managed Care – PPO

## 2021-05-04 ENCOUNTER — Other Ambulatory Visit: Payer: Self-pay

## 2021-05-04 DIAGNOSIS — Z23 Encounter for immunization: Secondary | ICD-10-CM

## 2021-05-04 NOTE — Progress Notes (Signed)
Pt is here today for 2nd shingrix vaccine. Pt was given shingrix in left deltoid. Pt tolerated well.

## 2021-06-23 ENCOUNTER — Encounter: Payer: Self-pay | Admitting: Family Medicine

## 2021-06-27 ENCOUNTER — Other Ambulatory Visit: Payer: Self-pay

## 2021-06-27 ENCOUNTER — Ambulatory Visit: Payer: BC Managed Care – PPO | Admitting: Family Medicine

## 2021-06-27 ENCOUNTER — Encounter: Payer: Self-pay | Admitting: Family Medicine

## 2021-06-27 VITALS — BP 110/84 | HR 61 | Temp 98.6°F | Resp 18 | Ht 67.0 in | Wt 189.2 lb

## 2021-06-27 DIAGNOSIS — I10 Essential (primary) hypertension: Secondary | ICD-10-CM | POA: Diagnosis not present

## 2021-06-27 DIAGNOSIS — E785 Hyperlipidemia, unspecified: Secondary | ICD-10-CM

## 2021-06-27 DIAGNOSIS — R739 Hyperglycemia, unspecified: Secondary | ICD-10-CM | POA: Diagnosis not present

## 2021-06-27 DIAGNOSIS — R609 Edema, unspecified: Secondary | ICD-10-CM

## 2021-06-27 DIAGNOSIS — E1165 Type 2 diabetes mellitus with hyperglycemia: Secondary | ICD-10-CM | POA: Diagnosis not present

## 2021-06-27 NOTE — Assessment & Plan Note (Signed)
Well controlled, no changes to meds. Encouraged heart healthy diet such as the DASH diet and exercise as tolerated.  °

## 2021-06-27 NOTE — Assessment & Plan Note (Signed)
Check labs 

## 2021-06-27 NOTE — Progress Notes (Signed)
Subjective:   By signing my name below, I, [name], attest that this documentation has been prepared under the direction and in the presence of Dr. Roma Schanz, DO. 06/27/2021    Patient ID: Alexandria [name], female    DOB: [birth year], 60 y.o.   MRN: [id number]  Chief Complaint  Patient presents with   Foot Swelling    Both feet, Pt states swelling was Wed and Thursday last week. Pt states heaviness and discomfort with walking     HPI Patient is in today for a office visit.  She complains of swelling in her since last Wednesday. Her swelling has improved since then. She did not have a change in diet. She did not increase her salt intake. She drinks plenty of water. She does not stand on her feet all day.  She continue taking 240 mg diltiazem daily PO and reports no new issues while taking it. She denies having any swelling in her hands.   BP Readings from Last 3 Encounters:  06/27/21 110/84  04/20/21 133/66  03/08/21 132/78   Pulse Readings from Last 3 Encounters:  06/27/21 61  04/20/21 (!) 58  03/08/21 66   She also reports going to the bathroom frequently. She notes that she drinks a lot of water through out the day.   Past Medical History:  Diagnosis Date   Anxiety    Atrial fibrillation ([place])    Depression    GERD (gastroesophageal reflux disease)    Graves disease    Hiatal hernia    a. seen on CT 12/2016.   Hyperlipidemia    Hypertension    NSVT (nonsustained ventricular tachycardia) (HCC)    Pulmonary nodules    a. seen on CT 12/2016.   Stroke [facility])    TIA (transient ischemic attack) 01/2017   a. tx at [facility].    Past Surgical History:  Procedure Laterality Date   ATRIAL FIBRILLATION ABLATION N/A 09/12/2018   Procedure: ATRIAL FIBRILLATION ABLATION;  Surgeon: Thompson Grayer, MD;  Location: [place] [facility];  Service: Cardiovascular;  Laterality: N/A;   CARPAL TUNNEL RELEASE      Family History  Problem Relation Age of Onset   Coronary  artery disease Father    Atrial fibrillation Father    Diabetes Maternal Grandmother    Hyperlipidemia Mother    Dementia Mother    Coronary artery disease Paternal Grandfather    Coronary artery disease Paternal Uncle    Colon polyps Brother        says he goes every year to have a colonoscopy   Colon cancer Neg Hx    Esophageal cancer Neg Hx     Social History   Socioeconomic History   Marital status: Divorced    Spouse name: Not on file   Number of children: 4   Years of education: Not on file   Highest education level: Not on file  Occupational History   Occupation: Music therapist  Tobacco Use   Smoking status: Never   Smokeless tobacco: Never  Vaping Use   Vaping Use: Never used  Substance and Sexual Activity   Alcohol use: Yes    Alcohol/week: 1.0 standard drink    Types: 1 Glasses of wine per week    Comment: occasionally,    Drug use: No   Sexual activity: Yes  Other Topics Concern   Not on file  Social History Narrative   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     ALPRAZolam (XANAX) 0.5 MG tablet TAKE 1/2 TO 1 TABLET BY MOUTH TWICE A DAY AS NEEDED 30 tablet 1   atorvastatin (LIPITOR) 40 MG tablet Take 1 tablet (40 mg total) by mouth every evening. 90 tablet 3   diltiazem (CARDIZEM CD) 240 MG 24 hr capsule TAKE 1 CAPSULE BY MOUTH EVERY DAY 90 capsule 2   ELIQUIS 5 MG TABS tablet TAKE 1 TABLET BY MOUTH TWICE A DAY 60 tablet 6   famotidine (PEPCID) 40 MG tablet TAKE 1 TABLET BY MOUTH EVERYDAY AT  BEDTIME 90 tablet 2   flecainide (TAMBOCOR) 50 MG tablet TAKE 1 TABLET BY MOUTH TWICE A DAY 180 tablet 2   levothyroxine (SYNTHROID, LEVOTHROID) 112 MCG tablet Take 112 mcg by mouth daily.  6   oxymetazoline (AFRIN) 0.05 % nasal spray Place 1 spray into both nostrils as needed for congestion.      pantoprazole (PROTONIX) 40 MG tablet TAKE 1 TABLET BY MOUTH TWICE A DAY 180 tablet 1   Tetrahydrozoline HCl (VISINE OP) Place 1 drop into both eyes daily as needed (dry eyes).     diltiazem (CARDIZEM) 30 MG tablet Take 1 tablet (30 mg total) by mouth daily as needed. For palpitations/heart rate above 110bpm 90 tablet 1   No facility-administered medications prior to visit.    Allergies  Allergen Reactions   Peanut-Containing Drug Products Swelling    Review of Systems  Constitutional:  Negative for fever and malaise/fatigue.  HENT:  Negative for congestion.   Eyes:  Negative for blurred vision.  Respiratory:  Negative for cough and shortness of breath.   Cardiovascular:  Negative for chest pain, palpitations, orthopnea and leg swelling.       (+)Bilateral foot swelling  Gastrointestinal:  Negative for vomiting.  Musculoskeletal:  Negative for back pain.       (-)Hand swelling  Skin:  Negative for rash.  Neurological:  Negative for loss of consciousness and headaches.      Objective:    Physical Exam Vitals and nursing note reviewed.  Constitutional:      General: She is not in acute distress.    Appearance: Normal appearance. She is not ill-appearing.  HENT:     Head: Normocephalic and atraumatic.     Right Ear: External ear normal.     Left Ear: External ear normal.  Eyes:     Extraocular Movements: Extraocular movements intact.     Pupils: Pupils are equal, round, and reactive to light.  Cardiovascular:     Rate and Rhythm: Normal rate and regular rhythm.     Heart sounds: Normal heart sounds. No murmur heard.   No gallop.  Pulmonary:     Effort: Pulmonary effort is  normal. No respiratory distress.     Breath sounds: Normal breath sounds. No wheezing or rales.  Musculoskeletal:        General: Swelling present.     Right lower leg: 1+ Pitting Edema present.     Left lower  leg: 1+ Pitting Edema present.  Skin:    General: Skin is warm and dry.  Neurological:     Mental Status: She is alert and oriented to person, place, and time.  Psychiatric:        Behavior: Behavior normal.        Judgment: Judgment normal.    BP 110/84 (BP Location: Left Arm, Patient Position: Sitting, Cuff Size: Normal)   Pulse 61   Temp 98.6 F (37 C) (Oral)   Resp 18   Ht '5\' 7"'$  (1.702 m)   Wt 189 lb 3.2 oz (85.8 kg)   LMP 01/26/2012   SpO2 97%   BMI 29.63 kg/m  Wt Readings from Last 3 Encounters:  06/27/21 189 lb 3.2 oz (85.8 kg)  04/20/21 191 lb (86.6 kg)  03/08/21 191 lb 2 oz (86.7 kg)    Diabetic Foot Exam - Simple   No data filed    Lab Results  Component Value Date   WBC 6.5 01/14/2020   HGB 11.7 01/14/2020   HCT 36.3 01/14/2020   PLT 299 01/14/2020   GLUCOSE 98 02/21/2021   CHOL 112 02/21/2021   TRIG 132.0 02/21/2021   HDL 32.10 (L) 02/21/2021   LDLCALC 54 02/21/2021   ALT 16 02/21/2021   AST 14 02/21/2021   NA 141 02/21/2021   K 3.3 (L) 02/21/2021   CL 106 02/21/2021   CREATININE 0.96 02/21/2021   BUN 17 02/21/2021   CO2 28 02/21/2021   TSH 0.65 04/22/2018   HGBA1C 6.1 04/22/2018    Lab Results  Component Value Date   TSH 0.65 04/22/2018   Lab Results  Component Value Date   WBC 6.5 01/14/2020   HGB 11.7 01/14/2020   HCT 36.3 01/14/2020   MCV 88 01/14/2020   PLT 299 01/14/2020   Lab Results  Component Value Date   NA 141 02/21/2021   K 3.3 (L) 02/21/2021   CO2 28 02/21/2021   GLUCOSE 98 02/21/2021   BUN 17 02/21/2021   CREATININE 0.96 02/21/2021   BILITOT 0.3 02/21/2021   ALKPHOS 113 02/21/2021   AST 14 02/21/2021   ALT 16 02/21/2021   PROT 6.7 02/21/2021   ALBUMIN 3.8 02/21/2021   CALCIUM 8.7 02/21/2021    ANIONGAP 8 04/04/2018   GFR 63.71 02/21/2021   Lab Results  Component Value Date   CHOL 112 02/21/2021   Lab Results  Component Value Date   HDL 32.10 (L) 02/21/2021   Lab Results  Component Value Date   LDLCALC 54 02/21/2021   Lab Results  Component Value Date   TRIG 132.0 02/21/2021   Lab Results  Component Value Date   CHOLHDL 3 02/21/2021   Lab Results  Component Value Date   HGBA1C 6.1 04/22/2018       Assessment & Plan:   Problem List Items Addressed This Visit       Unprioritized   HLD (hyperlipidemia)   Relevant Orders   Comprehensive metabolic panel   Edema    Elevate legs  Compression socks  She wants to hold off on medication for now Drink plenty of fluids       Essential hypertension    Well controlled, no changes to meds. Encouraged heart healthy diet such as the DASH diet and exercise as tolerated.       Hyperglycemia    Check labs       Relevant Orders   Comprehensive metabolic panel   Hemoglobin A1c   Other Visit  Diagnoses     Type 2 diabetes mellitus with hyperglycemia, without long-term current use of insulin ([place])    -  Primary        No orders of the defined types were placed in this encounter.   I, Dr. Roma Schanz, DO, personally preformed the services described in this documentation.  All medical record entries made by the scribe were at my direction and in my presence.  I have reviewed the chart and discharge instructions (if applicable) and agree that the record reflects my personal performance and is accurate and complete. 06/27/2021   I,[name],acting as a scribe for [name], DO.,have documented all relevant documentation on the behalf of [name], DO,as directed by  [name], DO while in the presence of [name], DO.   [name], DO

## 2021-06-27 NOTE — Assessment & Plan Note (Signed)
Elevate legs  Compression socks  She wants to hold off on medication for now Drink plenty of fluids

## 2021-06-27 NOTE — Patient Instructions (Signed)

## 2021-06-28 LAB — COMPREHENSIVE METABOLIC PANEL
ALT: 16 U/L (ref 0–35)
AST: 16 U/L (ref 0–37)
Albumin: 4 g/dL (ref 3.5–5.2)
Alkaline Phosphatase: 108 U/L (ref 39–117)
BUN: 20 mg/dL (ref 6–23)
CO2: 27 mEq/L (ref 19–32)
Calcium: 9.1 mg/dL (ref 8.4–10.5)
Chloride: 104 mEq/L (ref 96–112)
Creatinine, Ser: 0.87 mg/dL (ref 0.40–1.20)
GFR: 71.53 mL/min (ref >60.00)
Glucose, Bld: 96 mg/dL (ref 70–99)
Potassium: 3.4 mEq/L — ABNORMAL LOW (ref 3.5–5.1)
Sodium: 140 mEq/L (ref 135–145)
Total Bilirubin: 0.4 mg/dL (ref 0.2–1.2)
Total Protein: 6.6 g/dL (ref 6.0–8.3)

## 2021-06-28 LAB — HEMOGLOBIN A1C: Hgb A1c MFr Bld: 6.3 % (ref 4.6–6.5)

## 2021-06-29 NOTE — Telephone Encounter (Signed)
Please change the name RG

## 2021-07-06 ENCOUNTER — Other Ambulatory Visit: Payer: Self-pay | Admitting: Internal Medicine

## 2021-07-06 NOTE — Telephone Encounter (Signed)
Pt last saw Dr Rayann Heman 02/14/21, last labs 06/27/21 Creat 0.87, age 60, weight 85.8kg, based on specified criteria pt is on appropriate dosage of Eliquis '5mg'$  BID for afib.  Will refill rx.

## 2021-08-15 ENCOUNTER — Encounter: Payer: BC Managed Care – PPO | Admitting: Family Medicine

## 2021-08-23 ENCOUNTER — Encounter: Payer: BC Managed Care – PPO | Admitting: Family Medicine

## 2021-09-07 ENCOUNTER — Other Ambulatory Visit: Payer: Self-pay | Admitting: Internal Medicine

## 2021-09-16 ENCOUNTER — Other Ambulatory Visit: Payer: Self-pay

## 2021-09-16 ENCOUNTER — Encounter: Payer: Self-pay | Admitting: Family Medicine

## 2021-09-16 ENCOUNTER — Ambulatory Visit (INDEPENDENT_AMBULATORY_CARE_PROVIDER_SITE_OTHER): Payer: BC Managed Care – PPO | Admitting: Family Medicine

## 2021-09-16 VITALS — BP 120/78 | HR 63 | Temp 98.9°F | Resp 18 | Ht 67.0 in | Wt 185.8 lb

## 2021-09-16 DIAGNOSIS — E785 Hyperlipidemia, unspecified: Secondary | ICD-10-CM | POA: Diagnosis not present

## 2021-09-16 DIAGNOSIS — R739 Hyperglycemia, unspecified: Secondary | ICD-10-CM | POA: Diagnosis not present

## 2021-09-16 DIAGNOSIS — K219 Gastro-esophageal reflux disease without esophagitis: Secondary | ICD-10-CM

## 2021-09-16 DIAGNOSIS — I4891 Unspecified atrial fibrillation: Secondary | ICD-10-CM | POA: Diagnosis not present

## 2021-09-16 DIAGNOSIS — I10 Essential (primary) hypertension: Secondary | ICD-10-CM | POA: Diagnosis not present

## 2021-09-16 DIAGNOSIS — E039 Hypothyroidism, unspecified: Secondary | ICD-10-CM

## 2021-09-16 DIAGNOSIS — K449 Diaphragmatic hernia without obstruction or gangrene: Secondary | ICD-10-CM

## 2021-09-16 DIAGNOSIS — Z1159 Encounter for screening for other viral diseases: Secondary | ICD-10-CM

## 2021-09-16 DIAGNOSIS — Z0000 Encounter for general adult medical examination without abnormal findings: Secondary | ICD-10-CM | POA: Diagnosis not present

## 2021-09-16 DIAGNOSIS — E2839 Other primary ovarian failure: Secondary | ICD-10-CM

## 2021-09-16 NOTE — Progress Notes (Signed)
Subjective:   By signing my name below, I, Zite Okoli, attest that this documentation has been prepared under the direction and in the presence of  Roma Schanz R DO. 09/16/2021    Patient ID: Alexandria [name], female    DOB: Apr 01, 1961, 60 y.o.   MRN: [id number]  Chief Complaint  Patient presents with   Annual Exam    Pt states not fasting     HPI Patient is in today for a comprehensive physical exam.  She mentions she is doing well.  She has been seeing a gastroenterologist because she has extreme GERD but would like to see a Psychologist, sport and exercise. She did a colonoscopy and endoscopy and the results were okay. She does not eat past 4 pm and would like to see if the reflux can be fixed.  She has also noticed little bumps on her face and would like to see a dermatologist.  She reports she has been feeling very tight above her knees. She finds it very hard to squat and stand up from low position. She also feels her knees "popping" when she climbs stairs.   She mentions she has always had an overactive bladder since she was young. She urinates frequently every 2 hours and always has a full bladder. She notes there has ben no changes and does not think she needs to worry about it.  She denies fever, hearing loss, ear pain,congestion, sinus pain, sore throat, eye pain, chest pain, palpitations, cough, shortness of breath, wheezing, nausea. vomiting, diarrhea, constipation, blood in stool, dysuria,frequency, hematuria and headaches.   She has received the flu vaccine.  She has 3 Pfizer Covid-19 vaccines at his time and will get the booster vaccine at a later time.  There has been no recent changes in her family medical history.  Past Medical History:  Diagnosis Date   Anxiety    Atrial fibrillation ([place])    Depression    GERD (gastroesophageal reflux disease)    Graves disease    Hiatal hernia    a. seen on CT 12/2016.   Hyperlipidemia    Hypertension    NSVT (nonsustained ventricular  tachycardia)    Pulmonary nodules    a. seen on CT 12/2016.   Stroke [facility])    TIA (transient ischemic attack) 01/2017   a. tx at [facility].    Past Surgical History:  Procedure Laterality Date   ATRIAL FIBRILLATION ABLATION N/A 09/12/2018   Procedure: ATRIAL FIBRILLATION ABLATION;  Surgeon: Thompson Grayer, MD;  Location: [place] [facility];  Service: Cardiovascular;  Laterality: N/A;   CARPAL TUNNEL RELEASE      Family History  Problem Relation Age of Onset   Coronary artery disease Father    Atrial fibrillation Father    Diabetes Maternal Grandmother    Hyperlipidemia Mother    Dementia Mother    Coronary artery disease Paternal Grandfather    Coronary artery disease Paternal Uncle    Colon polyps Brother        says he goes every year to have a colonoscopy   Colon cancer Neg Hx    Esophageal cancer Neg Hx     Social History   Socioeconomic History   Marital status: Divorced    Spouse name: Not on file   Number of children: 4   Years of education: Not on file   Highest education level: Not on file  Occupational History   Occupation: Music therapist  Tobacco Use   Smoking status: Never  Smokeless tobacco: Never  Vaping Use   Vaping Use: Never used  Substance and Sexual Activity   Alcohol use: Yes    Alcohol/week: 1.0 standard drink    Types: 1 Glasses of wine per week    Comment: occasionally,    Drug use: No   Sexual activity: Yes  Other Topics Concern   Not on file  Social History Narrative   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     ALPRAZolam (XANAX) 0.5 MG tablet TAKE 1/2 TO 1 TABLET BY MOUTH TWICE A DAY AS NEEDED 30 tablet 1   atorvastatin (LIPITOR) 40 MG tablet Take 1 tablet (40 mg total) by mouth every evening. 90 tablet 3   diltiazem (CARDIZEM CD) 240 MG 24 hr capsule TAKE 1 CAPSULE BY MOUTH EVERY DAY 90 capsule 2   ELIQUIS 5 MG TABS tablet TAKE 1 TABLET BY MOUTH TWICE A DAY 60 tablet 6   famotidine (PEPCID) 40 MG tablet TAKE 1 TABLET BY MOUTH EVERYDAY AT BEDTIME 90 tablet 2   flecainide (TAMBOCOR) 50 MG tablet TAKE 1 TABLET BY MOUTH TWICE A DAY 180 tablet 2   levothyroxine (SYNTHROID, LEVOTHROID) 112 MCG tablet Take 112 mcg by mouth daily.  6   oxymetazoline (AFRIN) 0.05 % nasal spray Place 1 spray into both nostrils as needed for congestion.      pantoprazole (PROTONIX) 40 MG tablet TAKE 1 TABLET BY MOUTH TWICE A DAY 180 tablet 1   Tetrahydrozoline HCl (VISINE OP) Place 1 drop into both eyes daily as needed (dry eyes).     diltiazem (CARDIZEM) 30 MG tablet Take 1 tablet (30 mg total) by mouth daily as needed. For palpitations/heart rate above 110bpm 90 tablet 1   No facility-administered medications prior to visit.    Allergies  Allergen Reactions   Peanut-Containing Drug Products Swelling    Review of Systems  Constitutional:  Negative for fever and malaise/fatigue.  HENT:  Negative for congestion, ear pain, hearing loss, sinus pain and sore throat.   Eyes:  Negative for blurred vision and pain.  Respiratory:  Negative for cough, sputum production, shortness of breath and wheezing.   Cardiovascular:  Negative for chest pain, palpitations and leg swelling.  Gastrointestinal:  Negative for abdominal pain, blood in stool, constipation, diarrhea, nausea and vomiting.  Genitourinary:  Negative for dysuria, frequency, hematuria and urgency.  Musculoskeletal:  Positive for myalgias (thighs). Negative for back pain and falls.  Skin:  Negative for rash.   Neurological:  Negative for dizziness, sensory change, loss of consciousness, weakness and headaches.  Endo/Heme/Allergies:  Negative for environmental allergies. Does not bruise/bleed easily.  Psychiatric/Behavioral:  Negative for depression and suicidal ideas. The patient is not nervous/anxious and does not have insomnia.       Objective:    Physical Exam Vitals and nursing note reviewed.  Constitutional:      General: She is not in acute distress.    Appearance: Normal appearance. She is well-developed. She is  not ill-appearing.  HENT:     Head: Normocephalic and atraumatic.     Right Ear: External ear normal.     Left Ear: External ear normal.  Eyes:     Extraocular Movements: Extraocular movements intact.     Conjunctiva/sclera: Conjunctivae normal.     Pupils: Pupils are equal, round, and reactive to light.  Neck:     Thyroid: No thyromegaly.     Vascular: No carotid bruit or JVD.  Cardiovascular:     Rate and Rhythm: Normal rate and regular rhythm.     Pulses: Normal pulses.     Heart sounds: Normal heart sounds. No murmur heard.   No gallop.  Pulmonary:     Effort: Pulmonary effort is normal. No respiratory distress.     Breath sounds: Normal breath sounds. No wheezing, rhonchi or rales.  Chest:     Chest wall: No tenderness.  Abdominal:     General: Bowel sounds are normal. There is no distension.     Palpations: Abdomen is soft. There is no mass.     Tenderness: There is no abdominal tenderness. There is no guarding or rebound.     Hernia: No hernia is present.  Musculoskeletal:        General: Normal range of motion.     Cervical back: Normal range of motion and neck supple.  Lymphadenopathy:     Cervical: No cervical adenopathy.  Skin:    General: Skin is warm and dry.  Neurological:     General: No focal deficit present.     Mental Status: She is alert and oriented to person, place, and time.  Psychiatric:        Mood and Affect: Mood normal.         Behavior: Behavior normal.        Thought Content: Thought content normal.    BP 120/78 (BP Location: Left Arm, Patient Position: Sitting, Cuff Size: Normal)   Pulse 63   Temp 98.9 F (37.2 C) (Oral)   Resp 18   Ht 5\' 7"  (1.702 m)   Wt 185 lb 12.8 oz (84.3 kg)   LMP 01/26/2012   SpO2 97%   BMI 29.10 kg/m  Wt Readings from Last 3 Encounters:  09/16/21 185 lb 12.8 oz (84.3 kg)  06/27/21 189 lb 3.2 oz (85.8 kg)  04/20/21 191 lb (86.6 kg)    Diabetic Foot Exam - Simple   No data filed    Lab Results  Component Value Date   WBC 6.5 01/14/2020   HGB 11.7 01/14/2020   HCT 36.3 01/14/2020   PLT 299 01/14/2020   GLUCOSE 96 06/27/2021   CHOL 112 02/21/2021   TRIG 132.0 02/21/2021   HDL 32.10 (L) 02/21/2021   LDLCALC 54 02/21/2021   ALT 16 06/27/2021   AST 16 06/27/2021   NA 140 06/27/2021   K 3.4 (L) 06/27/2021   CL 104 06/27/2021   CREATININE 0.87 06/27/2021   BUN 20 06/27/2021   CO2 27 06/27/2021   TSH 0.65 04/22/2018   HGBA1C 6.3 06/27/2021    Lab Results  Component Value Date   TSH 0.65 04/22/2018   Lab Results  Component Value Date   WBC 6.5 01/14/2020   HGB 11.7 01/14/2020   HCT 36.3 01/14/2020   MCV 88 01/14/2020   PLT 299 01/14/2020   Lab Results  Component Value Date   NA 140 06/27/2021   K 3.4 (L) 06/27/2021   CO2 27 06/27/2021  GLUCOSE 96 06/27/2021   BUN 20 06/27/2021   CREATININE 0.87 06/27/2021   BILITOT 0.4 06/27/2021   ALKPHOS 108 06/27/2021   AST 16 06/27/2021   ALT 16 06/27/2021   PROT 6.6 06/27/2021   ALBUMIN 4.0 06/27/2021   CALCIUM 9.1 06/27/2021   ANIONGAP 8 04/04/2018   GFR 71.53 06/27/2021   Lab Results  Component Value Date   CHOL 112 02/21/2021   Lab Results  Component Value Date   HDL 32.10 (L) 02/21/2021   Lab Results  Component Value Date   LDLCALC 54 02/21/2021   Lab Results  Component Value Date   TRIG 132.0 02/21/2021   Lab Results  Component Value Date   CHOLHDL 3 02/21/2021   Lab Results   Component Value Date   HGBA1C 6.3 06/27/2021        Mammogram: Last completed on 02/25/2010. There were benign micro calcifications in the right breast. Repeat in 1 year. DUE Colonoscopy: Last completed on 04/20/2021. There were polyps in he rectum an mid sigmoid colon which were resected and retrieved. There were non-bleeding internal hemorrhoids. Repeat in 10 years. Pap Smear: Last completed on 05/08/2011. Results were normal. Repeat in 3 years. DUE  Assessment & Plan:   Problem List Items Addressed This Visit       Unprioritized   Hiatal hernia with gastroesophageal reflux   Relevant Orders   Ambulatory referral to General Surgery   Hyperglycemia   Relevant Orders   Hemoglobin A1c   Microalbumin / creatinine urine ratio   Atrial fibrillation with [facility])    Per cardiology      Essential hypertension    Well controlled, no changes to meds. Encouraged heart healthy diet such as the DASH diet and exercise as tolerated.       HLD (hyperlipidemia)    Encourage heart healthy diet such as MIND or DASH diet, increase exercise, avoid trans fats, simple carbohydrates and processed foods, consider a krill or fish or flaxseed oil cap daily.       Relevant Orders   Comprehensive metabolic panel   Lipid panel   Other Visit Diagnoses     Preventative health care    -  Primary   Relevant Orders   CBC with Differential/Platelet   Comprehensive metabolic panel   Lipid panel   TSH   Hypothyroidism, unspecified type       Relevant Orders   TSH   Atrial fibrillation, unspecified type ([place])       Need for hepatitis C screening test       Relevant Orders   Hepatitis C antibody   Estrogen deficiency       Relevant Orders   DG Bone Density       No orders of the defined types were placed in this encounter.   I,Zite Okoli,acting as a Education administrator for [employer], DO.,have documented all relevant documentation on the behalf of [name], DO,as directed by   [name], DO while in the presence of [name], DO.   I,  [name] DO. , personally preformed the services described in this documentation.  All medical record entries made by the scribe were at my direction and in my presence.  I have reviewed the chart and discharge instructions (if applicable) and agree that the record reflects my personal performance and is accurate and complete. 09/16/2021

## 2021-09-16 NOTE — Patient Instructions (Signed)
Preventive Care 92-62 Years Old, Female Preventive care refers to lifestyle choices and visits with your health care provider that can promote health and wellness. Preventive care visits are also called wellness exams. What can I expect for my preventive care visit? Counseling Your health care provider may ask you questions about your: Medical history, including: Past medical problems. Family medical history. Pregnancy history. Current health, including: Menstrual cycle. Method of birth control. Emotional well-being. Home life and relationship well-being. Sexual activity and sexual health. Lifestyle, including: Alcohol, nicotine or tobacco, and drug use. Access to firearms. Diet, exercise, and sleep habits. Work and work Statistician. Sunscreen use. Safety issues such as seatbelt and bike helmet use. Physical exam Your health care provider will check your: Height and weight. These may be used to calculate your BMI (body mass index). BMI is a measurement that tells if you are at a healthy weight. Waist circumference. This measures the distance around your waistline. This measurement also tells if you are at a healthy weight and may help predict your risk of certain diseases, such as type 2 diabetes and high blood pressure. Heart rate and blood pressure. Body temperature. Skin for abnormal spots. What immunizations do I need? Vaccines are usually given at various ages, according to a schedule. Your health care provider will recommend vaccines for you based on your age, medical history, and lifestyle or other factors, such as travel or where you work. What tests do I need? Screening Your health care provider may recommend screening tests for certain conditions. This may include: Lipid and cholesterol levels. Diabetes screening. This is done by checking your blood sugar (glucose) after you have not eaten for a while (fasting). Pelvic exam and Pap test. Hepatitis B test. Hepatitis C  test. HIV (human immunodeficiency virus) test. STI (sexually transmitted infection) testing, if you are at risk. Lung cancer screening. Colorectal cancer screening. Mammogram. Talk with your health care provider about when you should start having regular mammograms. This may depend on whether you have a family history of breast cancer. BRCA-related cancer screening. This may be done if you have a family history of breast, ovarian, tubal, or peritoneal cancers. Bone density scan. This is done to screen for osteoporosis. Talk with your health care provider about your test results, treatment options, and if necessary, the need for more tests. Follow these instructions at home: Eating and drinking  Eat a diet that includes fresh fruits and vegetables, whole grains, lean protein, and low-fat dairy products. Take vitamin and mineral supplements as recommended by your health care provider. Do not drink alcohol if: Your health care provider tells you not to drink. You are pregnant, may be pregnant, or are planning to become pregnant. If you drink alcohol: Limit how much you have to 0-1 drink a day. Know how much alcohol is in your drink. In the [place] one drink equals one 12 oz bottle of beer (355 mL), one 5 oz glass of wine (148 mL), or one 1 oz glass of hard liquor (44 mL). Lifestyle Brush your teeth every morning and night with fluoride toothpaste. Floss one time each day. Exercise for at least 30 minutes 5 or more days each week. Do not use any products that contain nicotine or tobacco. These products include cigarettes, chewing tobacco, and vaping devices, such as e-cigarettes. If you need help quitting, ask your health care provider. Do not use drugs. If you are sexually active, practice safe sex. Use a condom or other form of protection to prevent  STIs. If you do not wish to become pregnant, use a form of birth control. If you plan to become pregnant, see your health care provider for a  prepregnancy visit. Take aspirin only as told by your health care provider. Make sure that you understand how much to take and what form to take. Work with your health care provider to find out whether it is safe and beneficial for you to take aspirin daily. Find healthy ways to manage stress, such as: Meditation, yoga, or listening to music. Journaling. Talking to a trusted person. Spending time with friends and family. Minimize exposure to UV radiation to reduce your risk of skin cancer. Safety Always wear your seat belt while driving or riding in a vehicle. Do not drive: If you have been drinking alcohol. Do not ride with someone who has been drinking. When you are tired or distracted. While texting. If you have been using any mind-altering substances or drugs. Wear a helmet and other protective equipment during sports activities. If you have firearms in your house, make sure you follow all gun safety procedures. Seek help if you have been physically or sexually abused. What's next? Visit your health care provider once a year for an annual wellness visit. Ask your health care provider how often you should have your eyes and teeth checked. Stay up to date on all vaccines. This information is not intended to replace advice given to you by your health care provider. Make sure you discuss any questions you have with your health care provider. Document Revised: 04/20/2021 Document Reviewed: 04/20/2021 Elsevier Patient Education  [employer].

## 2021-09-16 NOTE — Assessment & Plan Note (Signed)
Well controlled, no changes to meds. Encouraged heart healthy diet such as the DASH diet and exercise as tolerated.  °

## 2021-09-16 NOTE — Assessment & Plan Note (Signed)
Encourage heart healthy diet such as MIND or DASH diet, increase exercise, avoid trans fats, simple carbohydrates and processed foods, consider a krill or fish or flaxseed oil cap daily.  °

## 2021-09-16 NOTE — Assessment & Plan Note (Signed)
Per cardiology 

## 2021-09-18 ENCOUNTER — Other Ambulatory Visit: Payer: Self-pay | Admitting: Family Medicine

## 2021-09-18 DIAGNOSIS — D509 Iron deficiency anemia, unspecified: Secondary | ICD-10-CM

## 2021-09-19 LAB — MICROALBUMIN / CREATININE URINE RATIO
Creatinine, Urine: 127 mg/dL (ref 20–275)
Microalb Creat Ratio: 3 mcg/mg creat (ref <30)
Microalb, Ur: 0.4 mg/dL

## 2021-09-19 LAB — CBC WITH DIFFERENTIAL/PLATELET
Absolute Monocytes: 487 cells/uL (ref 200–950)
Basophils Absolute: 58 cells/uL (ref 0–200)
Basophils Relative: 1 %
Eosinophils Absolute: 99 cells/uL (ref 15–500)
Eosinophils Relative: 1.7 %
HCT: 30.8 % — ABNORMAL LOW (ref 35.0–45.0)
Hemoglobin: 9.7 g/dL — ABNORMAL LOW (ref 11.7–15.5)
Lymphs Abs: 1264 cells/uL (ref 850–3900)
MCH: 25.1 pg — ABNORMAL LOW (ref 27.0–33.0)
MCHC: 31.5 g/dL — ABNORMAL LOW (ref 32.0–36.0)
MCV: 79.6 fL — ABNORMAL LOW (ref 80.0–100.0)
MPV: 11.6 fL (ref 7.5–12.5)
Monocytes Relative: 8.4 %
Neutro Abs: 3892 cells/uL (ref 1500–7800)
Neutrophils Relative %: 67.1 %
Platelets: 340 10*3/uL (ref 140–400)
RBC: 3.87 10*6/uL (ref 3.80–5.10)
RDW: 13.3 % (ref 11.0–15.0)
Total Lymphocyte: 21.8 %
WBC: 5.8 10*3/uL (ref 3.8–10.8)

## 2021-09-19 LAB — COMPREHENSIVE METABOLIC PANEL
AG Ratio: 1.7 (calc) (ref 1.0–2.5)
ALT: 10 U/L (ref 6–29)
AST: 11 U/L (ref 10–35)
Albumin: 4 g/dL (ref 3.6–5.1)
Alkaline phosphatase (APISO): 110 U/L (ref 37–153)
BUN: 16 mg/dL (ref 7–25)
CO2: 25 mmol/L (ref 20–32)
Calcium: 8.9 mg/dL (ref 8.6–10.4)
Chloride: 107 mmol/L (ref 98–110)
Creat: 0.77 mg/dL (ref 0.50–1.05)
Globulin: 2.4 g/dL (calc) (ref 1.9–3.7)
Glucose, Bld: 102 mg/dL — ABNORMAL HIGH (ref 65–99)
Potassium: 3.5 mmol/L (ref 3.5–5.3)
Sodium: 141 mmol/L (ref 135–146)
Total Bilirubin: 0.3 mg/dL (ref 0.2–1.2)
Total Protein: 6.4 g/dL (ref 6.1–8.1)

## 2021-09-19 LAB — TSH: TSH: 4.3 mIU/L (ref 0.40–4.50)

## 2021-09-19 LAB — LIPID PANEL
Cholesterol: 118 mg/dL (ref <200)
HDL: 40 mg/dL — ABNORMAL LOW (ref >=50)
LDL Cholesterol (Calc): 59 mg/dL (calc)
Non-HDL Cholesterol (Calc): 78 mg/dL (calc) (ref <130)
Total CHOL/HDL Ratio: 3 (calc) (ref <5.0)
Triglycerides: 109 mg/dL (ref <150)

## 2021-09-19 LAB — HEMOGLOBIN A1C
Hgb A1c MFr Bld: 6 % of total Hgb — ABNORMAL HIGH (ref <5.7)
Mean Plasma Glucose: 126 mg/dL
eAG (mmol/L): 7 mmol/L

## 2021-09-19 LAB — HEPATITIS C ANTIBODY
Hepatitis C Ab: NONREACTIVE
SIGNAL TO CUT-OFF: 0.06 (ref <1.00)

## 2021-09-20 ENCOUNTER — Other Ambulatory Visit (HOSPITAL_BASED_OUTPATIENT_CLINIC_OR_DEPARTMENT_OTHER): Payer: Self-pay | Admitting: Family Medicine

## 2021-09-20 DIAGNOSIS — Z1231 Encounter for screening mammogram for malignant neoplasm of breast: Secondary | ICD-10-CM

## 2021-09-22 ENCOUNTER — Other Ambulatory Visit (HOSPITAL_BASED_OUTPATIENT_CLINIC_OR_DEPARTMENT_OTHER): Payer: Self-pay | Admitting: Family Medicine

## 2021-09-22 DIAGNOSIS — E2839 Other primary ovarian failure: Secondary | ICD-10-CM

## 2021-10-09 ENCOUNTER — Other Ambulatory Visit: Payer: Self-pay | Admitting: Gastroenterology

## 2021-11-01 ENCOUNTER — Other Ambulatory Visit: Payer: Self-pay

## 2021-11-01 ENCOUNTER — Ambulatory Visit: Payer: BC Managed Care – PPO | Admitting: Internal Medicine

## 2021-11-01 ENCOUNTER — Ambulatory Visit (HOSPITAL_BASED_OUTPATIENT_CLINIC_OR_DEPARTMENT_OTHER)
Admission: RE | Admit: 2021-11-01 | Discharge: 2021-11-01 | Disposition: A | Discharge status: Home / Self Care | Payer: BC Managed Care – PPO | Source: Ambulatory Visit | Attending: Family Medicine | Admitting: Family Medicine

## 2021-11-01 ENCOUNTER — Encounter: Payer: Self-pay | Admitting: Internal Medicine

## 2021-11-01 ENCOUNTER — Encounter (HOSPITAL_BASED_OUTPATIENT_CLINIC_OR_DEPARTMENT_OTHER): Payer: Self-pay

## 2021-11-01 VITALS — BP 116/60 | HR 60 | Ht 67.0 in | Wt 175.0 lb

## 2021-11-01 DIAGNOSIS — Z1231 Encounter for screening mammogram for malignant neoplasm of breast: Secondary | ICD-10-CM | POA: Diagnosis present

## 2021-11-01 DIAGNOSIS — E2839 Other primary ovarian failure: Secondary | ICD-10-CM

## 2021-11-01 DIAGNOSIS — I10 Essential (primary) hypertension: Secondary | ICD-10-CM | POA: Diagnosis not present

## 2021-11-01 DIAGNOSIS — I480 Paroxysmal atrial fibrillation: Secondary | ICD-10-CM | POA: Diagnosis not present

## 2021-11-01 NOTE — Progress Notes (Signed)
PCP: [name], DO   Primary EP: Dr Dwaine Gale is a 60 y.o. female who presents today for routine electrophysiology followup.  Since last being seen in our clinic, the patient reports doing very well.  Today, she denies symptoms of palpitations, chest pain, shortness of breath,  lower extremity edema, dizziness, presyncope, or syncope.  The patient is otherwise without complaint today.   Past Medical History:  Diagnosis Date   Anxiety    Atrial fibrillation ([place])    Depression    GERD (gastroesophageal reflux disease)    Graves disease    Hiatal hernia    a. seen on CT 12/2016.   Hyperlipidemia    Hypertension    NSVT (nonsustained ventricular tachycardia)    Pulmonary nodules    a. seen on CT 12/2016.   Stroke [facility])    TIA (transient ischemic attack) 01/2017   a. tx at [facility].   Past Surgical History:  Procedure Laterality Date   ATRIAL FIBRILLATION ABLATION N/A 09/12/2018   Procedure: ATRIAL FIBRILLATION ABLATION;  Surgeon: Thompson Grayer, MD;  Location: [place] CV LAB;  Service: Cardiovascular;  Laterality: N/A;   CARPAL TUNNEL RELEASE      ROS- all systems are reviewed and negatives except as per HPI above  Current Outpatient Medications  Medication Sig Dispense Refill   acetaminophen (TYLENOL) 500 MG tablet Take 500 mg by mouth 2 (two) times daily as needed for moderate pain or headache.     ALPRAZolam (XANAX) 0.5 MG tablet TAKE 1/2 TO 1 TABLET BY MOUTH TWICE A DAY AS NEEDED 30 tablet 1   atorvastatin (LIPITOR) 40 MG tablet Take 1 tablet (40 mg total) by mouth every evening. 90 tablet 3   diltiazem (CARDIZEM CD) 240 MG 24 hr capsule TAKE 1 CAPSULE BY MOUTH EVERY DAY 90 capsule 2   ELIQUIS 5 MG TABS tablet TAKE 1 TABLET BY MOUTH TWICE A DAY 60 tablet 6   famotidine (PEPCID) 40 MG tablet TAKE 1 TABLET BY MOUTH EVERYDAY AT BEDTIME 90 tablet 2   flecainide (TAMBOCOR) 50 MG tablet TAKE 1 TABLET BY MOUTH TWICE A DAY 180 tablet 2    levothyroxine (SYNTHROID, LEVOTHROID) 112 MCG tablet Take 112 mcg by mouth daily.  6   oxymetazoline (AFRIN) 0.05 % nasal spray Place 1 spray into both nostrils as needed for congestion.      pantoprazole (PROTONIX) 40 MG tablet TAKE 1 TABLET BY MOUTH TWICE A DAY 180 tablet 1   Tetrahydrozoline HCl (VISINE OP) Place 1 drop into both eyes daily as needed (dry eyes).     diltiazem (CARDIZEM) 30 MG tablet Take 1 tablet (30 mg total) by mouth daily as needed. For palpitations/heart rate above 110bpm 90 tablet 1   No current facility-administered medications for this visit.    Physical Exam: Vitals:   11/01/21 1523  BP: 116/60  Pulse: 60  SpO2: 97%  Weight: 175 lb (79.4 kg)  Height: 5\' 7"  (1.702 m)    GEN- The patient is well appearing, alert and oriented x 3 today.   Head- normocephalic, atraumatic Eyes-  Sclera clear, conjunctiva pink Ears- hearing intact Oropharynx- clear Lungs- Clear to ausculation bilaterally, normal work of breathing Heart- Regular rate and rhythm, no murmurs, rubs or gallops, PMI not laterally displaced GI- soft, NT, ND, + BS Extremities- no clubbing, cyanosis, or edema  Wt Readings from Last 3 Encounters:  11/01/21 175 lb (79.4 kg)  09/16/21 185 lb 12.8 oz (  84.3 kg)  06/27/21 189 lb 3.2 oz (85.8 kg)    EKG tracing ordered today is personally reviewed and shows sinus  Assessment and Plan:  Paroxysmal atrial fibrillation Well controlled post ablation 2019.  She is also on flecainide Chads2vasc score is 4.  2. HTN Stable No change required today   Risks, benefits and potential toxicities for medications prescribed and/or refilled reviewed with patient today.   Follow-up with EP APP in 6 months  Thompson Grayer MD, [facility] 11/01/2021 3:48 PM

## 2021-11-01 NOTE — Patient Instructions (Addendum)
Medication Instructions:  Your physician recommends that you continue on your current medications as directed. Please refer to the Current Medication list given to you today. *If you need a refill on your cardiac medications before your next appointment, please call your pharmacy*  Lab Work: None. If you have labs (blood work) drawn today and your tests are completely normal, you will receive your results only by: [place] (if you have MyChart) OR A paper copy in the mail If you have any lab test that is abnormal or we need to change your treatment, we will call you to review the results.  Testing/Procedures: None.  Follow-Up: At [facility], you and your health needs are our priority.  As part of our continuing mission to provide you with exceptional heart care, we have created designated Provider Care Teams.  These Care Teams include your primary Cardiologist (physician) and Advanced Practice Providers (APPs -  Physician Assistants and Nurse Practitioners) who all work together to provide you with the care you need, when you need it.  Your physician wants you to follow-up in: 6 months with one of the following Advanced Practice Providers on your designated Care Team:    Tommye Standard, PA-C    You will receive a reminder letter in the mail two months in advance. If you don't receive a letter, please call our office to schedule the follow-up appointment.  We recommend signing up for the patient portal called "MyChart".  Sign up information is provided on this After Visit Summary.  MyChart is used to connect with patients for Virtual Visits (Telemedicine).  Patients are able to view lab/test results, encounter notes, upcoming appointments, etc.  Non-urgent messages can be sent to your provider as well.   To learn more about what you can do with MyChart, go to [email or web address].    Any Other Special Instructions Will Be Listed Below (If Applicable).  Afib Clinic #  [phone]

## 2021-11-11 ENCOUNTER — Other Ambulatory Visit: Payer: Self-pay | Admitting: Family Medicine

## 2021-11-11 DIAGNOSIS — R928 Other abnormal and inconclusive findings on diagnostic imaging of breast: Secondary | ICD-10-CM

## 2021-12-11 ENCOUNTER — Encounter: Payer: Self-pay | Admitting: Family Medicine

## 2021-12-12 NOTE — Telephone Encounter (Signed)
DOD:  Could you answer this since Dr. Etter Sjogren is out of the office? Thank you

## 2022-01-09 ENCOUNTER — Ambulatory Visit
Admission: RE | Admit: 2022-01-09 | Discharge: 2022-01-09 | Disposition: A | Discharge status: Home / Self Care | Payer: BC Managed Care – PPO | Source: Ambulatory Visit | Attending: Family Medicine | Admitting: Family Medicine

## 2022-01-09 ENCOUNTER — Other Ambulatory Visit: Payer: Self-pay | Admitting: Family Medicine

## 2022-01-09 ENCOUNTER — Other Ambulatory Visit: Payer: Self-pay

## 2022-01-09 ENCOUNTER — Ambulatory Visit: Payer: BC Managed Care – PPO

## 2022-01-09 DIAGNOSIS — R928 Other abnormal and inconclusive findings on diagnostic imaging of breast: Secondary | ICD-10-CM

## 2022-01-09 DIAGNOSIS — N6489 Other specified disorders of breast: Secondary | ICD-10-CM

## 2022-01-09 DIAGNOSIS — N6310 Unspecified lump in the right breast, unspecified quadrant: Secondary | ICD-10-CM

## 2022-01-09 DIAGNOSIS — R921 Mammographic calcification found on diagnostic imaging of breast: Secondary | ICD-10-CM

## 2022-01-16 ENCOUNTER — Encounter: Payer: Self-pay | Admitting: Internal Medicine

## 2022-01-18 ENCOUNTER — Encounter: Payer: Self-pay | Admitting: Internal Medicine

## 2022-01-19 ENCOUNTER — Other Ambulatory Visit: Payer: Self-pay | Admitting: General Practice

## 2022-01-19 ENCOUNTER — Ambulatory Visit
Admission: RE | Admit: 2022-01-19 | Discharge: 2022-01-19 | Disposition: A | Discharge status: Home / Self Care | Payer: BC Managed Care – PPO | Source: Ambulatory Visit | Attending: Family Medicine | Admitting: Family Medicine

## 2022-01-19 DIAGNOSIS — R921 Mammographic calcification found on diagnostic imaging of breast: Secondary | ICD-10-CM

## 2022-01-19 DIAGNOSIS — N6310 Unspecified lump in the right breast, unspecified quadrant: Secondary | ICD-10-CM

## 2022-01-19 DIAGNOSIS — N6489 Other specified disorders of breast: Secondary | ICD-10-CM

## 2022-02-02 ENCOUNTER — Other Ambulatory Visit: Payer: Self-pay | Admitting: Family Medicine

## 2022-02-02 ENCOUNTER — Other Ambulatory Visit: Payer: Self-pay | Admitting: Internal Medicine

## 2022-02-02 DIAGNOSIS — E785 Hyperlipidemia, unspecified: Secondary | ICD-10-CM

## 2022-02-03 NOTE — Telephone Encounter (Signed)
Prescription refill request for Eliquis received. ? ?Indication: afib  ?Last office visit: allred, 11/01/2021 ?Scr: 0.77, 09/16/2021 ?Age: 61 yo  ?Weight: 79.4 kg  ? ?Refill sent.  ?

## 2022-02-05 ENCOUNTER — Encounter: Payer: Self-pay | Admitting: Internal Medicine

## 2022-02-06 ENCOUNTER — Ambulatory Visit (HOSPITAL_COMMUNITY): Payer: BC Managed Care – PPO | Admitting: Physician Assistant

## 2022-02-06 MED ORDER — DILTIAZEM HCL 30 MG PO TABS: 30.0000 mg | ORAL_TABLET | Freq: Every day | ORAL | 1 refills | Status: DC | PRN

## 2022-02-17 ENCOUNTER — Ambulatory Visit (HOSPITAL_BASED_OUTPATIENT_CLINIC_OR_DEPARTMENT_OTHER): Payer: BC Managed Care – PPO | Admitting: Internal Medicine

## 2022-02-17 ENCOUNTER — Encounter (HOSPITAL_BASED_OUTPATIENT_CLINIC_OR_DEPARTMENT_OTHER): Payer: Self-pay | Admitting: Internal Medicine

## 2022-02-17 VITALS — BP 116/70 | HR 56 | Ht 67.0 in | Wt 178.2 lb

## 2022-02-17 DIAGNOSIS — I10 Essential (primary) hypertension: Secondary | ICD-10-CM

## 2022-02-17 DIAGNOSIS — I480 Paroxysmal atrial fibrillation: Secondary | ICD-10-CM | POA: Diagnosis not present

## 2022-02-17 MED ORDER — FLECAINIDE ACETATE 100 MG PO TABS: 100.0000 mg | ORAL_TABLET | Freq: Two times a day (BID) | ORAL | 3 refills | Status: DC

## 2022-02-17 NOTE — Patient Instructions (Addendum)
Medication Instructions:  ?Your physician has recommended you make the following change in your medication:  ? ? INCREASE your flecainide-  Take 100 mg by mouth twice a day ? ?Labwork: ?None ordered. ? ?Testing/Procedures: ?None ordered. ? ?Follow-Up: ?Your physician wants you to follow-up in: 3 months with Thompson Grayer, MD ? ?  ?Any Other Special Instructions Will Be Listed Below (If Applicable). ? ?If you need a refill on your cardiac medications before your next appointment, please call your pharmacy.  ? ?Important Information About Sugar ? ? ? ? ? ? ?

## 2022-02-17 NOTE — Progress Notes (Signed)
? ? ?PCP: [name], DO ?  ?Primary EP: Dr Rayann Heman ? ?Alexandria [name] is a 61 y.o. female who presents today for routine electrophysiology followup.  Since last being seen in our clinic, the patient reports doing reasonably well. Over the past month, she has had increasing frequency and duration of her afib.  + palpitations and fatigue.  Today, she denies symptoms of chest pain, shortness of breath,  lower extremity edema, dizziness, presyncope, or syncope.  The patient is otherwise without complaint today.  ? ?Past Medical History:  ?Diagnosis Date  ? Anxiety   ? Atrial fibrillation ([place])   ? Depression   ? GERD (gastroesophageal reflux disease)   ? Graves disease   ? Hiatal hernia   ? a. seen on CT 12/2016.  ? Hyperlipidemia   ? Hypertension   ? NSVT (nonsustained ventricular tachycardia) ([place])   ? Pulmonary nodules   ? a. seen on CT 12/2016.  ? Stroke [facility])   ? TIA (transient ischemic attack) 01/2017  ? a. tx at [facility].  ? ?Past Surgical History:  ?Procedure Laterality Date  ? ATRIAL FIBRILLATION ABLATION N/A 09/12/2018  ? Procedure: ATRIAL FIBRILLATION ABLATION;  Surgeon: Thompson Grayer, MD;  Location: [place] CV LAB;  Service: Cardiovascular;  Laterality: N/A;  ? CARPAL TUNNEL RELEASE    ? ? ?ROS- all systems are reviewed and negatives except as per HPI above ? ?Current Outpatient Medications  ?Medication Sig Dispense Refill  ? acetaminophen (TYLENOL) 500 MG tablet Take 500 mg by mouth 2 (two) times daily as needed for moderate pain or headache.    ? ALPRAZolam (XANAX) 0.5 MG tablet TAKE 1/2 TO 1 TABLET BY MOUTH TWICE A DAY AS NEEDED 30 tablet 1  ? atorvastatin (LIPITOR) 40 MG tablet TAKE 1 TABLET BY MOUTH EVERY DAY IN THE EVENING 90 tablet 3  ? diltiazem (CARDIZEM CD) 240 MG 24 hr capsule TAKE 1 CAPSULE BY MOUTH EVERY DAY 90 capsule 2  ? diltiazem (CARDIZEM) 30 MG tablet Take 1 tablet (30 mg total) by mouth daily as needed. For palpitations/heart rate above 110bpm 90 tablet 1  ? ELIQUIS 5 MG  TABS tablet TAKE 1 TABLET BY MOUTH TWICE A DAY 60 tablet 6  ? famotidine (PEPCID) 40 MG tablet TAKE 1 TABLET BY MOUTH EVERYDAY AT BEDTIME 90 tablet 2  ? flecainide (TAMBOCOR) 50 MG tablet TAKE 1 TABLET BY MOUTH TWICE A DAY 180 tablet 2  ? levothyroxine (SYNTHROID, LEVOTHROID) 112 MCG tablet Take 112 mcg by mouth daily.  6  ? oxymetazoline (AFRIN) 0.05 % nasal spray Place 1 spray into both nostrils as needed for congestion.     ? pantoprazole (PROTONIX) 40 MG tablet TAKE 1 TABLET BY MOUTH TWICE A DAY 180 tablet 1  ? Tetrahydrozoline HCl (VISINE OP) Place 1 drop into both eyes daily as needed (dry eyes).    ? ?No current facility-administered medications for this visit.  ? ? ?Physical Exam: ?Vitals:  ? 02/17/22 1452  ?BP: 116/70  ?Pulse: (!) 56  ?SpO2: 97%  ?Weight: 178 lb 3.2 oz (80.8 kg)  ?Height: '5\' 7"'$  (1.702 m)  ? ? ?GEN- The patient is well appearing, alert and oriented x 3 today.   ?Head- normocephalic, atraumatic ?Eyes-  Sclera clear, conjunctiva pink ?Ears- hearing intact ?Oropharynx- clear ?Lungs- Clear to ausculation bilaterally, normal work of breathing ?Heart- Regular rate and rhythm, no murmurs, rubs or gallops, PMI not laterally displaced ?GI- soft, NT, ND, + BS ?Extremities- no  clubbing, cyanosis, or edema ? ?Wt Readings from Last 3 Encounters:  ?02/17/22 178 lb 3.2 oz (80.8 kg)  ?11/01/21 175 lb (79.4 kg)  ?09/16/21 185 lb 12.8 oz (84.3 kg)  ? ? ?EKG tracing ordered today is personally reviewed and shows sinus bradycardia 56 bpm, PR 188 msec, QTc 45mec ? ?Assessment and Plan: ? ?Paroxysmal atrial fibrillation ?S/p ablation 2019.  Continues to have occasional afib despite flecainide.  Episodes have frequently increased.  I have confirmed these by her apple watch tracings which I have personally reviewed today ?Chads2vasc score is 4.  She is on flecainide '50mg'$  BID ?We discussed options of repeat ablation and also increasing flecainide to '100mg'$  bid ?At this time, she would prefer to increase flecainide.   She may be more willing to consider ablation on return if still having AF.  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 ? ?2. HTN ?Stable ?No change required today ? ? ?Risks, benefits and potential toxicities for medications prescribed and/or refilled reviewed with patient today.  ? ?Return in 3 months ? ?JThompson GrayerMD, FACC ?02/17/2022 ?3:05 PM ? ? ? ? ?

## 2022-02-20 ENCOUNTER — Ambulatory Visit: Payer: Self-pay | Admitting: Surgery

## 2022-02-20 ENCOUNTER — Telehealth: Payer: Self-pay

## 2022-02-20 DIAGNOSIS — N6489 Other specified disorders of breast: Secondary | ICD-10-CM

## 2022-02-20 NOTE — Telephone Encounter (Signed)
? ?  Pre-operative Risk Assessment  ?  ?Patient Name: Alexandria [name]  ?DOB: February 28, 1961 ?MRN: [id number]  ? ?  ? ?Request for Surgical Clearance   ? ?Procedure:   Lumpectomy Surgery ? ?Date of Surgery:  Clearance TBD                              ?   ?Surgeon:  Dr Erroll Luna ?Surgeon's Group or Practice Name:  [facility] ?Phone number:  [phone] ?Fax number:  [phone] ?  ?Type of Clearance Requested:   ?- Medical  ?- Pharmacy:  Hold Apixaban (Eliquis)   ?  ?Type of Anesthesia:  General  ?  ?Additional requests/questions:   n/a ? ?Signed, ?Ulice Brilliant T   ?02/20/2022, 4:43 PM  ? ?

## 2022-02-21 NOTE — Telephone Encounter (Signed)
? ?  Patient Name: Alexandria [name]  ?DOB: 1961-03-16 ?MRN: [id number] ? ?Primary Cardiologist: Dr. Thompson Grayer ? ?Chart reviewed as part of pre-operative protocol coverage.  ?Patient was recently seen by Dr. Rayann Heman 02/17/22 therefore not ideal candidate to proceed with an additional tele-visit at this time. At that visit she was reporting increasing frequency and duration of AFib and plan as to increase flecainide, reserving repeat ablation as an additional option. Will route to MD for input on whether he feels patient can proceed with breast lumpectomy as requested. Dr. Rayann Heman - Please route response to P CV DIV PREOP (the pre-op pool). Thank you. ? ?Will also route to pharm for input on anticoag. ? ?Charlie Pitter, PA-C ?02/21/2022, 8:20 AM ? ? ?

## 2022-02-21 NOTE — Telephone Encounter (Signed)
Patient with diagnosis of atrial fibrillation on Eliquis for anticoagulation.   ? ?Procedure: lumpectomy ?Date of procedure: TBD ? ? ?CHA2DS2-VASc Score = 4  ? This indicates a 4.8% annual risk of stroke. ?The patient's score is based upon: ?CHF History: 0 ?HTN History: 1 ?Diabetes History: 0 ?Stroke History: 2 ?Vascular Disease History: 0 ?Age Score: 0 ?Gender Score: 1 ?  ?Per chart had symptoms consistent with TIA March 2018 ? ?CrCl 83 ?Platelet count 340 ? ?Per office protocol, patient can hold Eliquis for 2 days prior to procedure.   ?Patient will not need bridging with Lovenox (enoxaparin) around procedure. ? ? ?

## 2022-02-23 NOTE — Telephone Encounter (Signed)
Noted, await rec from MD as routed below. ?

## 2022-03-05 NOTE — Telephone Encounter (Signed)
Ok to proceed with the procedure without further CV testing if medically indicated.  No changes is AF management plan advised prior to surgery.  Ok to hold eliquis 36-48 hours prior to the procedure. ?

## 2022-03-06 NOTE — Telephone Encounter (Signed)
? ?  Patient Name: KLEIGH HOELZER  ?DOB: 11/08/1960 ?MRN: [id number] ? ?Primary Cardiologist: Dr. Thompson Grayer ? ?Chart reviewed as part of pre-operative protocol coverage. ? ?Per Dr. Rayann Heman, "Ok to proceed with the procedure without further CV testing if medically indicated.  No changes is AF management plan advised prior to surgery.  Ok to hold eliquis 36-48 hours prior to the procedure." ? ?Will route this bundled recommendation to requesting provider via Epic fax function. Please call with questions. ? ? ?Charlie Pitter, PA-C ?03/06/2022, 8:40 AM ? ? ?

## 2022-04-06 ENCOUNTER — Ambulatory Visit: Payer: BC Managed Care – PPO | Admitting: Family Medicine

## 2022-04-18 ENCOUNTER — Telehealth: Payer: Self-pay

## 2022-04-18 ENCOUNTER — Encounter: Payer: Self-pay | Admitting: Family Medicine

## 2022-04-18 ENCOUNTER — Other Ambulatory Visit: Payer: Self-pay | Admitting: Family Medicine

## 2022-04-18 ENCOUNTER — Ambulatory Visit: Payer: BC Managed Care – PPO | Admitting: Family Medicine

## 2022-04-18 VITALS — BP 108/70 | HR 65 | Temp 98.5°F | Resp 18 | Ht 67.0 in | Wt 178.2 lb

## 2022-04-18 DIAGNOSIS — R002 Palpitations: Secondary | ICD-10-CM

## 2022-04-18 DIAGNOSIS — I4891 Unspecified atrial fibrillation: Secondary | ICD-10-CM | POA: Diagnosis not present

## 2022-04-18 DIAGNOSIS — R739 Hyperglycemia, unspecified: Secondary | ICD-10-CM

## 2022-04-18 DIAGNOSIS — I10 Essential (primary) hypertension: Secondary | ICD-10-CM

## 2022-04-18 DIAGNOSIS — D509 Iron deficiency anemia, unspecified: Secondary | ICD-10-CM | POA: Diagnosis not present

## 2022-04-18 DIAGNOSIS — E039 Hypothyroidism, unspecified: Secondary | ICD-10-CM | POA: Diagnosis not present

## 2022-04-18 DIAGNOSIS — E785 Hyperlipidemia, unspecified: Secondary | ICD-10-CM | POA: Diagnosis not present

## 2022-04-18 DIAGNOSIS — Z79899 Other long term (current) drug therapy: Secondary | ICD-10-CM | POA: Diagnosis not present

## 2022-04-18 DIAGNOSIS — E1165 Type 2 diabetes mellitus with hyperglycemia: Secondary | ICD-10-CM

## 2022-04-18 DIAGNOSIS — E663 Overweight: Secondary | ICD-10-CM

## 2022-04-18 LAB — CBC WITH DIFFERENTIAL/PLATELET
Basophils Absolute: 0.1 10*3/uL (ref 0.0–0.1)
Basophils Relative: 1.3 % (ref 0.0–3.0)
Eosinophils Absolute: 0.1 10*3/uL (ref 0.0–0.7)
Eosinophils Relative: 2.1 % (ref 0.0–5.0)
HCT: 26.3 % — ABNORMAL LOW (ref 36.0–46.0)
Hemoglobin: 8 g/dL — CL (ref 12.0–15.0)
Lymphocytes Relative: 20.5 % (ref 12.0–46.0)
Lymphs Abs: 0.9 10*3/uL (ref 0.7–4.0)
MCHC: 30.5 g/dL (ref 30.0–36.0)
MCV: 66.6 fl — ABNORMAL LOW (ref 78.0–100.0)
Monocytes Absolute: 0.4 10*3/uL (ref 0.1–1.0)
Monocytes Relative: 10.3 % (ref 3.0–12.0)
Neutro Abs: 2.8 10*3/uL (ref 1.4–7.7)
Neutrophils Relative %: 65.8 % (ref 43.0–77.0)
Platelets: 304 10*3/uL (ref 150.0–400.0)
RBC: 3.94 Mil/uL (ref 3.87–5.11)
RDW: 17.8 % — ABNORMAL HIGH (ref 11.5–15.5)
WBC: 4.3 10*3/uL (ref 4.0–10.5)

## 2022-04-18 LAB — HEMOGLOBIN A1C: Hgb A1c MFr Bld: 6.4 % (ref 4.6–6.5)

## 2022-04-18 LAB — COMPREHENSIVE METABOLIC PANEL
ALT: 13 U/L (ref 0–35)
AST: 13 U/L (ref 0–37)
Albumin: 4 g/dL (ref 3.5–5.2)
Alkaline Phosphatase: 95 U/L (ref 39–117)
BUN: 15 mg/dL (ref 6–23)
CO2: 29 mEq/L (ref 19–32)
Calcium: 8.9 mg/dL (ref 8.4–10.5)
Chloride: 105 mEq/L (ref 96–112)
Creatinine, Ser: 0.83 mg/dL (ref 0.40–1.20)
GFR: 75.26 mL/min (ref >60.00)
Glucose, Bld: 82 mg/dL (ref 70–99)
Potassium: 3.7 mEq/L (ref 3.5–5.1)
Sodium: 140 mEq/L (ref 135–145)
Total Bilirubin: 0.4 mg/dL (ref 0.2–1.2)
Total Protein: 6.5 g/dL (ref 6.0–8.3)

## 2022-04-18 LAB — LIPID PANEL
Cholesterol: 120 mg/dL (ref 0–200)
HDL: 42.4 mg/dL (ref >39.00)
LDL Cholesterol: 60 mg/dL (ref 0–99)
NonHDL: 77.69
Total CHOL/HDL Ratio: 3
Triglycerides: 90 mg/dL (ref 0.0–149.0)
VLDL: 18 mg/dL (ref 0.0–40.0)

## 2022-04-18 LAB — VITAMIN B12: Vitamin B-12: 374 pg/mL (ref 211–911)

## 2022-04-18 LAB — VITAMIN D 25 HYDROXY (VIT D DEFICIENCY, FRACTURES): VITD: 30.06 ng/mL (ref 30.00–100.00)

## 2022-04-18 MED ORDER — ALPRAZOLAM 0.5 MG PO TABS: ORAL_TABLET | ORAL | 1 refills | Status: DC

## 2022-04-18 MED ORDER — IRON (FERROUS SULFATE) 325 (65 FE) MG PO TABS: 325.0000 mg | ORAL_TABLET | Freq: Every day | ORAL | 5 refills | Status: DC

## 2022-04-18 MED ORDER — WEGOVY 0.25 MG/0.5ML ~~LOC~~ SOAJ: 0.2500 mg | SUBCUTANEOUS | 0 refills | Status: DC

## 2022-04-18 NOTE — Progress Notes (Signed)
Subjective:   By signing my name below, I, Alexandria [name], attest that this documentation has been prepared under the direction and in the presence of Alexandria Held, DO  04/18/2022    Patient ID: Alexandria [name], female    DOB: 1961-06-09, 61 y.o.   MRN: [id number]  Chief Complaint  Patient presents with   Hypertension   Hyperlipidemia   Hypothyroidism   Follow-up    Hypertension  Hyperlipidemia   Patient is in today for a follow up visit.   She has an upcomming appointment to have a breast reduction and reconstruction procedure. Her plastic surgeon found her red blood cell count was low and she needed to follow up with her primary care. She has a pale complexion at this time. She is currently on a octavia diet but is not consistent with it. She is pre-diabetic at this time Wt Readings from Last 3 Encounters:  04/18/22 178 lb 3.2 oz (80.8 kg)  02/17/22 178 lb 3.2 oz (80.8 kg)  11/01/21 175 lb (79.4 kg)   Lab Results  Component Value Date   WBC 5.8 09/16/2021   HGB 9.7 (L) 09/16/2021   HCT 30.8 (L) 09/16/2021   MCV 79.6 (L) 09/16/2021   PLT 340 09/16/2021   She reports her sense of smell has decreased. She notes her sense of smell has always been weak but noticed it has worsened. She thinks it could be due to her allergies.  She complains of waking up multiple times at night to urinate. She is keeping up her fluid intake and drinks most of it in the evening.  She has 3 Covid-19 vaccines at this time.   Past Medical History:  Diagnosis Date   Anxiety    Atrial fibrillation ([place])    Depression    GERD (gastroesophageal reflux disease)    Graves disease    Hiatal hernia    a. seen on CT 12/2016.   Hyperlipidemia    Hypertension    NSVT (nonsustained ventricular tachycardia) (HCC)    Pulmonary nodules    a. seen on CT 12/2016.   Stroke [facility])    TIA (transient ischemic attack) 01/2017   a. tx at [facility].    Past Surgical History:  Procedure Laterality  Date   ATRIAL FIBRILLATION ABLATION N/A 09/12/2018   Procedure: ATRIAL FIBRILLATION ABLATION;  Surgeon: Thompson Grayer, MD;  Location: [facility];  Service: Cardiovascular;  Laterality: N/A;   CARPAL TUNNEL RELEASE      Family History  Problem Relation Age of Onset   Coronary artery disease Father    Atrial fibrillation Father    Diabetes Maternal Grandmother    Hyperlipidemia Mother    Dementia Mother    Coronary artery disease Paternal Grandfather    Coronary artery disease Paternal Uncle    Colon polyps Brother        says he goes every year to have a colonoscopy   Colon cancer Neg Hx    Esophageal cancer Neg Hx     Social History   Socioeconomic History   Marital status: Divorced    Spouse name: Not on file   Number of children: 4   Years of education: Not on file   Highest education level: Not on file  Occupational History   Occupation: Music therapist  Tobacco Use   Smoking status: Never   Smokeless tobacco: Never  Vaping Use   Vaping Use: Never used  Substance and Sexual Activity  Alcohol use: Yes    Alcohol/week: 1.0 standard drink of alcohol    Types: 1 Glasses of wine per week    Comment: occasionally,    Drug use: No   Sexual activity: Yes  Other Topics Concern   Not on file  Social History Narrative   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     atorvastatin (LIPITOR) 40 MG tablet TAKE 1 TABLET BY MOUTH EVERY DAY IN THE EVENING 90 tablet 3   diltiazem (CARDIZEM CD) 240 MG 24 hr  capsule TAKE 1 CAPSULE BY MOUTH EVERY DAY 90 capsule 2   diltiazem (CARDIZEM) 30 MG tablet Take 1 tablet (30 mg total) by mouth daily as needed. For palpitations/heart rate above 110bpm 90 tablet 1   ELIQUIS 5 MG TABS tablet TAKE 1 TABLET BY MOUTH TWICE A DAY 60 tablet 6   famotidine (PEPCID) 40 MG tablet TAKE 1 TABLET BY MOUTH EVERYDAY AT BEDTIME 90 tablet 2   flecainide (TAMBOCOR) 100 MG tablet Take 1 tablet (100 mg total) by mouth 2 (two) times daily. 180 tablet 3   levothyroxine (SYNTHROID, LEVOTHROID) 112 MCG tablet Take 112 mcg by mouth daily.  6   oxymetazoline (AFRIN) 0.05 % nasal spray Place 1 spray into both nostrils as needed for congestion.      pantoprazole (PROTONIX) 40 MG tablet TAKE 1 TABLET BY MOUTH TWICE A DAY 180 tablet 1   Tetrahydrozoline HCl (VISINE OP) Place 1 drop into both eyes daily as needed (dry eyes).     ALPRAZolam (XANAX) 0.5 MG tablet TAKE 1/2 TO 1 TABLET BY MOUTH TWICE A DAY AS NEEDED 30 tablet 1   No facility-administered medications prior to visit.    Allergies  Allergen Reactions   Peanut-Containing Drug Products Swelling    Review of Systems  HENT:         (+)decreased smell  Genitourinary:  Positive for frequency (at night).       Objective:    Physical Exam Constitutional:      General: She is not in acute distress.    Appearance: Normal appearance. She is not ill-appearing.  HENT:     Head: Normocephalic and atraumatic.     Right Ear: External ear normal.     Left Ear: External ear normal.  Eyes:     Extraocular Movements: Extraocular movements intact.     Pupils: Pupils are equal, round, and reactive to light.  Cardiovascular:     Rate and Rhythm: Normal rate and regular rhythm.     Heart sounds: Normal heart sounds. No murmur heard.    No gallop.  Pulmonary:     Effort: Pulmonary effort is normal. No respiratory distress.     Breath sounds: Normal breath sounds. No wheezing or rales.  Skin:    General: Skin is warm and dry.   Neurological:     Mental Status: She is alert and oriented to person, place, and time.  Psychiatric:  Judgment: Judgment normal.     BP 108/70 (BP Location: Left Arm, Patient Position: Sitting, Cuff Size: Normal)   Pulse 65   Temp 98.5 F (36.9 C) (Oral)   Resp 18   Ht '5\' 7"'$  (1.702 m)   Wt 178 lb 3.2 oz (80.8 kg)   LMP 01/26/2012   SpO2 97%   BMI 27.91 kg/m  Wt Readings from Last 3 Encounters:  04/18/22 178 lb 3.2 oz (80.8 kg)  02/17/22 178 lb 3.2 oz (80.8 kg)  11/01/21 175 lb (79.4 kg)    Diabetic Foot Exam - Simple   No data filed    Lab Results  Component Value Date   WBC 5.8 09/16/2021   HGB 9.7 (L) 09/16/2021   HCT 30.8 (L) 09/16/2021   PLT 340 09/16/2021   GLUCOSE 102 (H) 09/16/2021   CHOL 118 09/16/2021   TRIG 109 09/16/2021   HDL 40 (L) 09/16/2021   LDLCALC 59 09/16/2021   ALT 10 09/16/2021   AST 11 09/16/2021   NA 141 09/16/2021   K 3.5 09/16/2021   CL 107 09/16/2021   CREATININE 0.77 09/16/2021   BUN 16 09/16/2021   CO2 25 09/16/2021   TSH 4.30 09/16/2021   HGBA1C 6.0 (H) 09/16/2021   MICROALBUR 0.4 09/16/2021    Lab Results  Component Value Date   TSH 4.30 09/16/2021   Lab Results  Component Value Date   WBC 5.8 09/16/2021   HGB 9.7 (L) 09/16/2021   HCT 30.8 (L) 09/16/2021   MCV 79.6 (L) 09/16/2021   PLT 340 09/16/2021   Lab Results  Component Value Date   NA 141 09/16/2021   K 3.5 09/16/2021   CO2 25 09/16/2021   GLUCOSE 102 (H) 09/16/2021   BUN 16 09/16/2021   CREATININE 0.77 09/16/2021   BILITOT 0.3 09/16/2021   ALKPHOS 108 06/27/2021   AST 11 09/16/2021   ALT 10 09/16/2021   PROT 6.4 09/16/2021   ALBUMIN 4.0 06/27/2021   CALCIUM 8.9 09/16/2021   ANIONGAP 8 04/04/2018   GFR 71.53 06/27/2021   Lab Results  Component Value Date   CHOL 118 09/16/2021   Lab Results  Component Value Date   HDL 40 (L) 09/16/2021   Lab Results  Component Value Date   LDLCALC 59 09/16/2021   Lab Results  Component Value Date    TRIG 109 09/16/2021   Lab Results  Component Value Date   CHOLHDL 3.0 09/16/2021   Lab Results  Component Value Date   HGBA1C 6.0 (H) 09/16/2021       Assessment & Plan:   Problem List Items Addressed This Visit       Unprioritized   Hyperglycemia   Relevant Orders   Comprehensive metabolic panel   Hemoglobin A1c   Hypothyroidism    Check labs  Stable       Relevant Orders   Vitamin B12   HLD (hyperlipidemia)    Tolerating statin, encouraged heart healthy diet, avoid trans fats, minimize simple carbs and saturated fats. Increase exercise as tolerated      Relevant Orders   Comprehensive metabolic panel   Lipid panel   Essential hypertension    Well controlled, no changes to meds. Encouraged heart healthy diet such as the DASH diet and exercise as tolerated.       Atrial fibrillation with RVR ([place])    Per card       Other Visit Diagnoses     High risk medication use    -  Primary   Relevant Orders   Drug Monitoring Panel [id number] , Urine   Palpitations       Relevant Medications   ALPRAZolam (XANAX) 0.5 MG tablet   Other Relevant Orders   Vitamin B12   VITAMIN D 25 Hydroxy (Vit-D Deficiency, Fractures)   Primary hypertension       Relevant Orders   Vitamin B12   VITAMIN D 25 Hydroxy (Vit-D Deficiency, Fractures)   Atrial fibrillation, unspecified type ([place])       Relevant Orders   CBC with Differential/Platelet   Type 2 diabetes mellitus with hyperglycemia, without long-term current use of insulin (HCC)       Iron deficiency anemia, unspecified iron deficiency anemia type       Relevant Orders   CBC with Differential/Platelet   Overweight (BMI 25.0-29.9)       Relevant Medications   Semaglutide-Weight Management (WEGOVY) 0.25 MG/0.5ML SOAJ        Meds ordered this encounter  Medications   ALPRAZolam (XANAX) 0.5 MG tablet    Sig: TAKE 1/2 TO 1 TABLET BY MOUTH TWICE A DAY AS NEEDED    Dispense:  30 tablet    Refill:  1    Semaglutide-Weight Management (WEGOVY) 0.25 MG/0.5ML SOAJ    Sig: Inject 0.25 mg into the skin once a week.    Dispense:  2 mL    Refill:  0    I, Alexandria Held, DO, personally preformed the services described in this documentation.  All medical record entries made by the scribe were at my direction and in my presence.  I have reviewed the chart and discharge instructions (if applicable) and agree that the record reflects my personal performance and is accurate and complete. 04/18/2022   I,Alexandria [name],acting as a Education administrator for [employer], DO.,have documented all relevant documentation on the behalf of Alexandria Held, DO,as directed by  Alexandria Held, DO while in the presence of Alexandria Held, DO.   Alexandria Held, DO

## 2022-04-18 NOTE — Assessment & Plan Note (Signed)
Tolerating statin, encouraged heart healthy diet, avoid trans fats, minimize simple carbs and saturated fats. Increase exercise as tolerated 

## 2022-04-18 NOTE — Assessment & Plan Note (Signed)
Well controlled, no changes to meds. Encouraged heart healthy diet such as the DASH diet and exercise as tolerated.  °

## 2022-04-18 NOTE — Assessment & Plan Note (Signed)
Per card 

## 2022-04-18 NOTE — Assessment & Plan Note (Signed)
Check labs  Stable

## 2022-04-18 NOTE — Telephone Encounter (Signed)
CRITICAL VALUE STICKER  CRITICAL VALUE: Hemoglobin 8.0   RECEIVER (on-site recipient of call): Manuela Schwartz, RMA  DATE & TIME NOTIFIED: 04/18/2022 at 4:10 pm   MESSENGER (representative from lab): Delorise Jackson   MD NOTIFIED: Roma Schanz, DO  TIME OF NOTIFICATION: 4:13 pm   RESPONSE:

## 2022-04-18 NOTE — Patient Instructions (Signed)

## 2022-04-19 ENCOUNTER — Other Ambulatory Visit: Payer: Self-pay | Admitting: Family

## 2022-04-19 ENCOUNTER — Other Ambulatory Visit: Payer: Self-pay

## 2022-04-19 ENCOUNTER — Telehealth: Payer: Self-pay

## 2022-04-19 DIAGNOSIS — D649 Anemia, unspecified: Secondary | ICD-10-CM

## 2022-04-19 NOTE — Telephone Encounter (Signed)
PA initiated via Covermymeds; KEY: [id number]. Awaiting determination.

## 2022-04-19 NOTE — Telephone Encounter (Signed)
PA approved.  Effective 04/19/2022 - 11/19/2022

## 2022-04-20 LAB — DRUG MONITORING PANEL 376104, URINE
Amphetamines: NEGATIVE ng/mL (ref <500)
Barbiturates: NEGATIVE ng/mL (ref <300)
Benzodiazepines: NEGATIVE ng/mL (ref <100)
Cocaine Metabolite: NEGATIVE ng/mL (ref <150)
Desmethyltramadol: NEGATIVE ng/mL (ref <100)
Opiates: NEGATIVE ng/mL (ref <100)
Oxycodone: NEGATIVE ng/mL (ref <100)
Tramadol: NEGATIVE ng/mL (ref <100)

## 2022-04-20 LAB — DM TEMPLATE

## 2022-04-21 ENCOUNTER — Encounter: Payer: Self-pay | Admitting: Family

## 2022-04-21 ENCOUNTER — Inpatient Hospital Stay: Payer: BC Managed Care – PPO | Attending: Hematology & Oncology

## 2022-04-21 ENCOUNTER — Other Ambulatory Visit: Payer: Self-pay | Admitting: Oncology

## 2022-04-21 ENCOUNTER — Inpatient Hospital Stay: Payer: BC Managed Care – PPO | Admitting: Family

## 2022-04-21 DIAGNOSIS — Z79899 Other long term (current) drug therapy: Secondary | ICD-10-CM | POA: Insufficient documentation

## 2022-04-21 DIAGNOSIS — D509 Iron deficiency anemia, unspecified: Secondary | ICD-10-CM | POA: Diagnosis present

## 2022-04-21 DIAGNOSIS — E039 Hypothyroidism, unspecified: Secondary | ICD-10-CM

## 2022-04-21 DIAGNOSIS — Z8673 Personal history of transient ischemic attack (TIA), and cerebral infarction without residual deficits: Secondary | ICD-10-CM | POA: Diagnosis not present

## 2022-04-21 DIAGNOSIS — F32A Depression, unspecified: Secondary | ICD-10-CM | POA: Insufficient documentation

## 2022-04-21 DIAGNOSIS — D649 Anemia, unspecified: Secondary | ICD-10-CM

## 2022-04-21 DIAGNOSIS — Z800 Family history of malignant neoplasm of digestive organs: Secondary | ICD-10-CM | POA: Diagnosis not present

## 2022-04-21 DIAGNOSIS — I4891 Unspecified atrial fibrillation: Secondary | ICD-10-CM | POA: Diagnosis not present

## 2022-04-21 DIAGNOSIS — Z7901 Long term (current) use of anticoagulants: Secondary | ICD-10-CM | POA: Insufficient documentation

## 2022-04-21 LAB — CBC WITH DIFFERENTIAL (CANCER CENTER ONLY)
Abs Immature Granulocytes: 0.02 10*3/uL (ref 0.00–0.07)
Basophils Absolute: 0.1 10*3/uL (ref 0.0–0.1)
Basophils Relative: 1 %
Eosinophils Absolute: 0.1 10*3/uL (ref 0.0–0.5)
Eosinophils Relative: 2 %
HCT: 26.9 % — ABNORMAL LOW (ref 36.0–46.0)
Hemoglobin: 7.7 g/dL — ABNORMAL LOW (ref 12.0–15.0)
Immature Granulocytes: 0 %
Lymphocytes Relative: 18 %
Lymphs Abs: 1 10*3/uL (ref 0.7–4.0)
MCH: 20.2 pg — ABNORMAL LOW (ref 26.0–34.0)
MCHC: 28.6 g/dL — ABNORMAL LOW (ref 30.0–36.0)
MCV: 70.4 fL — ABNORMAL LOW (ref 80.0–100.0)
Monocytes Absolute: 0.5 10*3/uL (ref 0.1–1.0)
Monocytes Relative: 8 %
Neutro Abs: 4.1 10*3/uL (ref 1.7–7.7)
Neutrophils Relative %: 71 %
Platelet Count: 310 10*3/uL (ref 150–400)
RBC: 3.82 MIL/uL — ABNORMAL LOW (ref 3.87–5.11)
RDW: 17.7 % — ABNORMAL HIGH (ref 11.5–15.5)
WBC Count: 5.8 10*3/uL (ref 4.0–10.5)
nRBC: 0 % (ref 0.0–0.2)

## 2022-04-21 LAB — SAVE SMEAR(SSMR), FOR PROVIDER SLIDE REVIEW

## 2022-04-21 LAB — CMP (CANCER CENTER ONLY)
ALT: 11 U/L (ref 0–44)
AST: 10 U/L — ABNORMAL LOW (ref 15–41)
Albumin: 4.1 g/dL (ref 3.5–5.0)
Alkaline Phosphatase: 87 U/L (ref 38–126)
Anion gap: 7 (ref 5–15)
BUN: 21 mg/dL (ref 8–23)
CO2: 27 mmol/L (ref 22–32)
Calcium: 9.1 mg/dL (ref 8.9–10.3)
Chloride: 107 mmol/L (ref 98–111)
Creatinine: 0.55 mg/dL (ref 0.44–1.00)
GFR, Estimated: >60 mL/min (ref >60)
Glucose, Bld: 113 mg/dL — ABNORMAL HIGH (ref 70–99)
Potassium: 3.7 mmol/L (ref 3.5–5.1)
Sodium: 141 mmol/L (ref 135–145)
Total Bilirubin: 0.3 mg/dL (ref 0.3–1.2)
Total Protein: 6.9 g/dL (ref 6.5–8.1)

## 2022-04-21 LAB — RETICULOCYTES
Immature Retic Fract: 17.3 % — ABNORMAL HIGH (ref 2.3–15.9)
RBC.: 3.79 MIL/uL — ABNORMAL LOW (ref 3.87–5.11)
Retic Count, Absolute: 41.3 10*3/uL (ref 19.0–186.0)
Retic Ct Pct: 1.1 % (ref 0.4–3.1)

## 2022-04-21 LAB — LACTATE DEHYDROGENASE: LDH: 162 U/L (ref 98–192)

## 2022-04-21 LAB — FERRITIN: Ferritin: 3 ng/mL — ABNORMAL LOW (ref 11–307)

## 2022-04-21 NOTE — Progress Notes (Unsigned)
Hematology/Oncology Consultation   Name: Alexandria [name]      MRN: [id number]    Location: Room/bed info not found  Date: 04/21/2022 Time:3:37 PM   REFERRING PHYSICIAN: Garnet [name], [place], Alexandria [name]  REASON FOR CONSULT: Iron deficiency anemia    DIAGNOSIS: Iron deficiency anemia   HISTORY OF PRESENT ILLNESS:  [name] is a very pleasant 61 yo caucasian female with history of iron deficiency anemia since childhood.  Iron saturation is 3% and ferritin 3.  She is symptomatic with fatigue, mild SOB and palpitations with atrial fib, craving/chewing ice and insomnia.  She is on Eliquis 5 mg PO BID for the atrial fib. She has not noted any obvious blood loss. No bruising or petechiae.  She has 4 sons and history of 1 miscarriage at 3 weeks. She is now postmenopausal.  She thinks that her mother also had anemia.  He father had ALS.  No personal history of cancer. Her father had basal cell skin cancer removed. She has history of TIA in 2017.  No history of diabetes.  She is on synthroid for hypothyroidism.  She has had carpal tunnel release, cardiac ablation and her wisdom teeth extracted without any complications.  She is waiting to schedule surgery for right lumpectomy of CSL as well as reconstruction with breast lift with [facility] Dr. Iran Planas.  No fever, chills, n/v, cough, rash, dizziness, chest pain, abdominal pain or changes in bowel or bladder habits.  No swelling, tenderness, numbness or tingling in her extremities at this time.  No falls or syncope to report.  No smoking or recreational drug use. Rare ETOH socially.  Appetite and hydration are good. Weight is stable at 178 lbs.  She is now retired but previously taught high school math. She loves calculus!   ROS: All other 10 point review of systems is negative.   PAST MEDICAL HISTORY:   Past Medical History:  Diagnosis Date   Anxiety    Atrial fibrillation ([employer])    Depression    GERD (gastroesophageal reflux disease)     Graves disease    Hiatal hernia    a. seen on CT 12/2016.   Hyperlipidemia    Hypertension    NSVT (nonsustained ventricular tachycardia) (HCC)    Pulmonary nodules    a. seen on CT 12/2016.   Stroke [facility])    TIA (transient ischemic attack) 01/2017   [facility]. [facility].    ALLERGIES: Allergies  Allergen Reactions   Peanut-Containing Drug Products Swelling      MEDICATIONS:  Current Outpatient Medications on File Prior to Visit  Medication Sig Dispense Refill   acetaminophen (TYLENOL) 500 MG tablet Take 500 mg by mouth 2 (two) times daily as needed for moderate pain or headache.     ALPRAZolam (XANAX) 0.5 MG tablet TAKE 1/2 TO 1 TABLET BY MOUTH TWICE A DAY AS NEEDED 30 tablet 1   atorvastatin (LIPITOR) 40 MG tablet TAKE 1 TABLET BY MOUTH EVERY DAY IN THE EVENING 90 tablet 3   diltiazem (CARDIZEM CD) 240 MG 24 hr capsule TAKE 1 CAPSULE BY MOUTH EVERY DAY 90 capsule 2   diltiazem (CARDIZEM) 30 MG tablet Take 1 tablet (30 mg total) by mouth daily as needed. For palpitations/heart rate above 110bpm 90 tablet 1   ELIQUIS 5 MG TABS tablet TAKE 1 TABLET BY MOUTH TWICE A DAY 60 tablet 6   famotidine (PEPCID) 40 MG tablet TAKE 1 TABLET BY MOUTH EVERYDAY AT BEDTIME 90 tablet 2  flecainide (TAMBOCOR) 100 MG tablet Take 1 tablet (100 mg total) by mouth 2 (two) times daily. 180 tablet 3   levothyroxine (SYNTHROID, LEVOTHROID) 112 MCG tablet Take 112 mcg by mouth daily.  6   oxymetazoline (AFRIN) 0.05 % nasal spray Place 1 spray into both nostrils as needed for congestion.      pantoprazole (PROTONIX) 40 MG tablet TAKE 1 TABLET BY MOUTH TWICE A DAY 180 tablet 1   Tetrahydrozoline HCl (VISINE OP) Place 1 drop into both eyes daily as needed (dry eyes).     Iron, Ferrous Sulfate, 325 (65 Fe) MG TABS Take 325 mg by mouth daily. (Patient not taking: Reported on 04/21/2022) 30 tablet 5   Semaglutide-Weight Management (WEGOVY) 0.25 MG/0.5ML SOAJ Inject 0.25 mg into the skin once a week. (Patient not  taking: Reported on 04/21/2022) 2 mL 0   No current facility-administered medications on file prior to visit.     PAST SURGICAL HISTORY Past Surgical History:  Procedure Laterality Date   ATRIAL FIBRILLATION ABLATION N/A 09/12/2018   Procedure: ATRIAL FIBRILLATION ABLATION;  Surgeon: Thompson Grayer, MD;  Location: [facility];  Service: Cardiovascular;  Laterality: N/A;   CARPAL TUNNEL RELEASE      FAMILY HISTORY: Family History  Problem Relation Age of Onset   Coronary artery disease Father    Atrial fibrillation Father    Diabetes Maternal Grandmother    Hyperlipidemia Mother    Dementia Mother    Coronary artery disease Paternal Grandfather    Coronary artery disease Paternal Uncle    Colon polyps Brother        says he goes every year to have a colonoscopy   Colon cancer Neg Hx    Esophageal cancer Neg Hx     SOCIAL HISTORY:  reports that she has never smoked. She has never used smokeless tobacco. She reports current alcohol use of about 1.0 standard drink of alcohol per week. She reports that she does not use drugs.  PERFORMANCE STATUS: The patient's performance status is 1 - Symptomatic but completely ambulatory  PHYSICAL EXAM: Most Recent Vital Signs: Blood pressure 128/69, pulse 60, temperature 99.2 F (37.3 C), temperature source Oral, resp. rate 17, height '5\' 7"'$  (1.702 m), weight 178 lb 1.9 oz (80.8 kg), last menstrual period 01/26/2012, SpO2 99 %. BP 128/69 (BP Location: Left Arm, Patient Position: Sitting)   Pulse 60   Temp 99.2 F (37.3 C) (Oral)   Resp 17   Ht '5\' 7"'$  (1.702 m)   Wt 178 lb 1.9 oz (80.8 kg)   LMP 01/26/2012   SpO2 99%   BMI 27.90 kg/m   General Appearance:    Alert, cooperative, no distress, appears stated age  Head:    Normocephalic, without obvious abnormality, atraumatic  Eyes:    PERRL, conjunctiva/corneas clear, EOM's intact, fundi    benign, both eyes        Throat:   Lips, mucosa, and tongue normal; teeth and gums normal   Neck:   Supple, symmetrical, trachea midline, no adenopathy;    thyroid:  no enlargement/tenderness/nodules; no carotid   bruit or JVD  Back:     Symmetric, no curvature, ROM normal, no CVA tenderness  Lungs:     Clear to auscultation bilaterally, respirations unlabored  Chest Wall:    No tenderness or deformity   Heart:    Regular rate and rhythm, S1 and S2 normal, no murmur, rub   or gallop     Abdomen:  Soft, non-tender, bowel sounds active all four quadrants,    no masses, no organomegaly        Extremities:   Extremities normal, atraumatic, no cyanosis or edema  Pulses:   2+ and symmetric all extremities  Skin:   Skin color, texture, turgor normal, no rashes or lesions  Lymph nodes:   Cervical, supraclavicular, and axillary nodes normal  Neurologic:   CNII-XII intact, normal strength, sensation and reflexes    throughout    LABORATORY DATA:  Results for orders placed or performed in visit on 04/21/22 (from the past 48 hour(s))  CBC with Differential (Cancer Center Only)     Status: Abnormal   Collection Time: 04/21/22  3:06 PM  Result Value Ref Range   WBC Count 5.8 4.0 - 10.5 K/uL   RBC 3.82 (L) 3.87 - 5.11 MIL/uL   Hemoglobin 7.7 (L) 12.0 - 15.0 g/dL    Comment: Reticulocyte Hemoglobin testing may be clinically indicated, consider ordering this additional test [id number]    HCT 26.9 (L) 36.0 - 46.0 %   MCV 70.4 (L) 80.0 - 100.0 fL   MCH 20.2 (L) 26.0 - 34.0 pg   MCHC 28.6 (L) 30.0 - 36.0 g/dL   RDW 17.7 (H) 11.5 - 15.5 %   Platelet Count 310 150 - 400 K/uL   nRBC 0.0 0.0 - 0.2 %   Neutrophils Relative % 71 %   Neutro Abs 4.1 1.7 - 7.7 K/uL   Lymphocytes Relative 18 %   Lymphs Abs 1.0 0.7 - 4.0 K/uL   Monocytes Relative 8 %   Monocytes Absolute 0.5 0.1 - 1.0 K/uL   Eosinophils Relative 2 %   Eosinophils Absolute 0.1 0.0 - 0.5 K/uL   Basophils Relative 1 %   Basophils Absolute 0.1 0.0 - 0.1 K/uL   Immature Granulocytes 0 %   Abs Immature Granulocytes 0.02  0.00 - 0.07 K/uL    Comment: Performed at [facility] at [facility], [address], [place], [place] [address]  Reticulocytes     Status: Abnormal   Collection Time: 04/21/22  3:06 PM  Result Value Ref Range   Retic Ct Pct 1.1 0.4 - 3.1 %   RBC. 3.79 (L) 3.87 - 5.11 MIL/uL   Retic Count, Absolute 41.3 19.0 - 186.0 K/uL   Immature Retic Fract 17.3 (H) 2.3 - 15.9 %    Comment: Performed at [facility] at [facility], [address] [place], [place] [address]  Save Smear [facility])     Status: None   Collection Time: 04/21/22  3:06 PM  Result Value Ref Range   Smear Review SMEAR STAINED AND AVAILABLE FOR REVIEW     Comment: Performed at [facility] at [facility], [address], [place], [place] [address]      RADIOGRAPHY: No results found.     PATHOLOGY: None  ASSESSMENT/PLAN: Ms. Maruyama is a very pleasant 61 yo caucasian female with history of iron deficiency anemia since childhood.  We will get her set up for 3 doses of IV iron.  Follow-up in 6 weeks.   All questions were answered. The patient knows to call the clinic with any problems, questions or concerns. We can certainly see the patient much sooner if necessary.  The patient was discussed with and also seen by Dr. Marin Olp and he is in agreement with the aforementioned.   Lottie Dawson, NP

## 2022-04-22 LAB — ERYTHROPOIETIN: Erythropoietin: 117.9 m[IU]/mL — ABNORMAL HIGH (ref 2.6–18.5)

## 2022-04-24 ENCOUNTER — Encounter: Payer: Self-pay | Admitting: Family

## 2022-04-24 ENCOUNTER — Encounter: Payer: Self-pay | Admitting: Family Medicine

## 2022-04-24 LAB — IRON AND IRON BINDING CAPACITY (CC-WL,HP ONLY)
Iron: 13 ug/dL — ABNORMAL LOW (ref 28–170)
Saturation Ratios: 3 % — ABNORMAL LOW (ref 10.4–31.8)
TIBC: 406 ug/dL (ref 250–450)
UIBC: 393 ug/dL (ref 148–442)

## 2022-04-26 ENCOUNTER — Encounter: Payer: Self-pay | Admitting: Family

## 2022-04-27 ENCOUNTER — Telehealth: Payer: Self-pay | Admitting: *Deleted

## 2022-05-01 ENCOUNTER — Inpatient Hospital Stay: Payer: BC Managed Care – PPO

## 2022-05-01 ENCOUNTER — Other Ambulatory Visit: Payer: Self-pay | Admitting: *Deleted

## 2022-05-01 VITALS — BP 122/63 | HR 55 | Temp 98.1°F | Resp 18

## 2022-05-01 DIAGNOSIS — D509 Iron deficiency anemia, unspecified: Secondary | ICD-10-CM

## 2022-05-01 DIAGNOSIS — D649 Anemia, unspecified: Secondary | ICD-10-CM

## 2022-05-01 LAB — OCCULT BLOOD X 1 CARD TO LAB, STOOL
Fecal Occult Bld: NEGATIVE
Fecal Occult Bld: NEGATIVE
Fecal Occult Bld: POSITIVE — AB

## 2022-05-01 MED ORDER — SODIUM CHLORIDE 0.9 % IV SOLN: Freq: Once | INTRAVENOUS | Status: AC

## 2022-05-01 MED ORDER — SODIUM CHLORIDE 0.9 % IV SOLN
300.0000 mg | Freq: Once | INTRAVENOUS | Status: AC
  Administered 2022-05-01: 300 mg via INTRAVENOUS
  Filled 2022-05-01: qty 300

## 2022-05-02 ENCOUNTER — Telehealth: Payer: Self-pay | Admitting: Family

## 2022-05-02 ENCOUNTER — Other Ambulatory Visit: Payer: Self-pay | Admitting: Family

## 2022-05-02 DIAGNOSIS — D509 Iron deficiency anemia, unspecified: Secondary | ICD-10-CM

## 2022-05-02 DIAGNOSIS — K921 Melena: Secondary | ICD-10-CM

## 2022-05-02 NOTE — Telephone Encounter (Signed)
Patient positive for blood in her stool and referred back to Dr. Chales Abrahams for further evaluation. Labs sent to Cardiologist Dr. Johney Frame. Patient will call their office today to get instructions on whether or not she can hold her Eliquis.  She has had mild nausea today. Her first IV iron infusion was yesterday. She will try taking pepcid and see if this helps. No other questions or concerns at this time. Patient appreciative of call.

## 2022-05-05 ENCOUNTER — Inpatient Hospital Stay: Payer: BC Managed Care – PPO

## 2022-05-05 VITALS — BP 112/62 | HR 57 | Temp 98.6°F | Resp 18

## 2022-05-05 DIAGNOSIS — D509 Iron deficiency anemia, unspecified: Secondary | ICD-10-CM | POA: Diagnosis not present

## 2022-05-05 MED ORDER — SODIUM CHLORIDE 0.9 % IV SOLN: Freq: Once | INTRAVENOUS | Status: AC

## 2022-05-05 MED ORDER — SODIUM CHLORIDE 0.9 % IV SOLN
300.0000 mg | Freq: Once | INTRAVENOUS | Status: AC
  Administered 2022-05-05: 300 mg via INTRAVENOUS
  Filled 2022-05-05: qty 300

## 2022-05-05 NOTE — Patient Instructions (Signed)

## 2022-05-10 ENCOUNTER — Inpatient Hospital Stay: Payer: BC Managed Care – PPO | Attending: Hematology & Oncology

## 2022-05-10 VITALS — BP 111/63 | HR 65 | Temp 98.6°F | Resp 16

## 2022-05-10 DIAGNOSIS — Z79899 Other long term (current) drug therapy: Secondary | ICD-10-CM | POA: Insufficient documentation

## 2022-05-10 DIAGNOSIS — D509 Iron deficiency anemia, unspecified: Secondary | ICD-10-CM | POA: Insufficient documentation

## 2022-05-10 MED ORDER — SODIUM CHLORIDE 0.9 % IV SOLN: Freq: Once | INTRAVENOUS | Status: AC

## 2022-05-10 MED ORDER — SODIUM CHLORIDE 0.9 % IV SOLN
300.0000 mg | Freq: Once | INTRAVENOUS | Status: AC
  Administered 2022-05-10: 300 mg via INTRAVENOUS
  Filled 2022-05-10: qty 300

## 2022-05-10 NOTE — Patient Instructions (Signed)

## 2022-05-17 ENCOUNTER — Ambulatory Visit: Payer: BC Managed Care – PPO | Admitting: Internal Medicine

## 2022-05-22 ENCOUNTER — Other Ambulatory Visit: Payer: Self-pay | Admitting: Gastroenterology

## 2022-05-24 ENCOUNTER — Ambulatory Visit: Payer: BC Managed Care – PPO | Admitting: Family

## 2022-05-24 ENCOUNTER — Other Ambulatory Visit: Payer: BC Managed Care – PPO

## 2022-05-25 ENCOUNTER — Inpatient Hospital Stay: Payer: BC Managed Care – PPO

## 2022-05-25 ENCOUNTER — Other Ambulatory Visit: Payer: Self-pay

## 2022-05-25 ENCOUNTER — Encounter: Payer: Self-pay | Admitting: Family

## 2022-05-25 ENCOUNTER — Inpatient Hospital Stay (HOSPITAL_BASED_OUTPATIENT_CLINIC_OR_DEPARTMENT_OTHER): Payer: BC Managed Care – PPO | Admitting: Family

## 2022-05-25 VITALS — BP 120/56 | HR 55 | Temp 98.1°F | Resp 18 | Ht 67.0 in | Wt 180.1 lb

## 2022-05-25 DIAGNOSIS — D509 Iron deficiency anemia, unspecified: Secondary | ICD-10-CM

## 2022-05-25 LAB — CBC WITH DIFFERENTIAL (CANCER CENTER ONLY)
Abs Immature Granulocytes: 0.1 10*3/uL — ABNORMAL HIGH (ref 0.00–0.07)
Basophils Absolute: 0.1 10*3/uL (ref 0.0–0.1)
Basophils Relative: 1 %
Eosinophils Absolute: 0.1 10*3/uL (ref 0.0–0.5)
Eosinophils Relative: 1 %
HCT: 35.6 % — ABNORMAL LOW (ref 36.0–46.0)
Hemoglobin: 10.5 g/dL — ABNORMAL LOW (ref 12.0–15.0)
Immature Granulocytes: 2 %
Lymphocytes Relative: 17 %
Lymphs Abs: 1 10*3/uL (ref 0.7–4.0)
MCH: 23.2 pg — ABNORMAL LOW (ref 26.0–34.0)
MCHC: 29.5 g/dL — ABNORMAL LOW (ref 30.0–36.0)
MCV: 78.8 fL — ABNORMAL LOW (ref 80.0–100.0)
Monocytes Absolute: 0.5 10*3/uL (ref 0.1–1.0)
Monocytes Relative: 8 %
Neutro Abs: 4.1 10*3/uL (ref 1.7–7.7)
Neutrophils Relative %: 71 %
Platelet Count: 287 10*3/uL (ref 150–400)
RBC: 4.52 MIL/uL (ref 3.87–5.11)
RDW: 28 % — ABNORMAL HIGH (ref 11.5–15.5)
WBC Count: 5.8 10*3/uL (ref 4.0–10.5)
nRBC: 0 % (ref 0.0–0.2)

## 2022-05-25 LAB — IRON AND IRON BINDING CAPACITY (CC-WL,HP ONLY)
Iron: 62 ug/dL (ref 28–170)
Saturation Ratios: 19 % (ref 10.4–31.8)
TIBC: 322 ug/dL (ref 250–450)
UIBC: 260 ug/dL (ref 148–442)

## 2022-05-25 LAB — RETICULOCYTES
Immature Retic Fract: 13.1 % (ref 2.3–15.9)
RBC.: 4.51 MIL/uL (ref 3.87–5.11)
Retic Count, Absolute: 63.1 10*3/uL (ref 19.0–186.0)
Retic Ct Pct: 1.4 % (ref 0.4–3.1)

## 2022-05-25 LAB — FERRITIN: Ferritin: 54 ng/mL (ref 11–307)

## 2022-05-25 NOTE — Progress Notes (Signed)
Hematology and Oncology Follow Up Visit  Alexandria [name] [id number] [birth year] 61 y.o. 05/25/2022   Principle Diagnosis:  Iron deficiency anemia  Current Therapy:   IV iron as indicated    Interim History:  Alexandria [name] is here today for follow-up. She is feeling much better since receiving IV iron. Her Hgb is now 10.8, MCV 78.  No blood loss noted. She is still waiting to here from Dr. Steve Rattler office.  No bruising or petechiae.  No fever, chills, n/v, cough, rash, dizziness, SOB, chest pain, palpitations, abdominal pain or changes in bowel or bladder habits.  She is hoping to have her right lumpectomy of CSL as well as reconstruction with breast lift as well as hiatal hernia repair once insurance approves.  No swelling, tenderness, numbness or tingling in her extremities.  No falls or syncope.  Appetite and hydration are good. Weight is stable at 180 lbs.   ECOG Performance Status: 1 - Symptomatic but completely ambulatory  Medications:  Allergies as of 05/25/2022       Reactions   Peanut-containing Drug Products Swelling        Medication List        Accurate as of May 25, 2022 10:57 AM. If you have any questions, ask your nurse or doctor.          acetaminophen 500 MG tablet Commonly known as: TYLENOL Take 500 mg by mouth 2 (two) times daily as needed for moderate pain or headache.   ALPRAZolam 0.5 MG tablet Commonly known as: XANAX TAKE 1/2 TO 1 TABLET BY MOUTH TWICE A DAY AS NEEDED   atorvastatin 40 MG tablet Commonly known as: LIPITOR TAKE 1 TABLET BY MOUTH EVERY DAY IN THE EVENING   diltiazem 240 MG 24 hr capsule Commonly known as: CARDIZEM CD TAKE 1 CAPSULE BY MOUTH EVERY DAY   diltiazem 30 MG tablet Commonly known as: CARDIZEM Take 1 tablet (30 mg total) by mouth daily as needed. For palpitations/heart rate above 110bpm   Eliquis 5 MG Tabs tablet Generic drug: apixaban TAKE 1 TABLET BY MOUTH TWICE A DAY   famotidine 40 MG tablet Commonly  known as: PEPCID TAKE 1 TABLET BY MOUTH EVERYDAY AT BEDTIME   flecainide 100 MG tablet Commonly known as: TAMBOCOR Take 1 tablet (100 mg total) by mouth 2 (two) times daily.   Iron (Ferrous Sulfate) 325 (65 Fe) MG Tabs Take 325 mg by mouth daily.   levothyroxine 112 MCG tablet Commonly known as: SYNTHROID Take 112 mcg by mouth daily.   oxymetazoline 0.05 % nasal spray Commonly known as: AFRIN Place 1 spray into both nostrils as needed for congestion.   pantoprazole 40 MG tablet Commonly known as: PROTONIX Take 1 tablet (40 mg total) by mouth daily. Please call [phone] to schedule an office visit for more refills   VISINE OP Place 1 drop into both eyes daily as needed (dry eyes).   Wegovy 0.25 MG/0.5ML Soaj Generic drug: Semaglutide-Weight Management Inject 0.25 mg into the skin once a week.        Allergies:  Allergies  Allergen Reactions   Peanut-Containing Drug Products Swelling    Past Medical History, Surgical history, Social history, and Family History were reviewed and updated.  Review of Systems: All other 10 point review of systems is negative.   Physical Exam:  vitals were not taken for this visit.   Wt Readings from Last 3 Encounters:  04/21/22 178 lb 1.9 oz (80.8 kg)  04/18/22 178 lb 3.2  oz (80.8 kg)  02/17/22 178 lb 3.2 oz (80.8 kg)    Ocular: Sclerae unicteric, pupils equal, round and reactive to light Ear-nose-throat: Oropharynx clear, dentition fair Lymphatic: No cervical or supraclavicular adenopathy Lungs no rales or rhonchi, good excursion bilaterally Heart regular rate and rhythm, no murmur appreciated Abd soft, nontender, positive bowel sounds MSK no focal spinal tenderness, no joint edema Neuro: non-focal, well-oriented, appropriate affect Breasts:   Lab Results  Component Value Date   WBC 5.8 04/21/2022   HGB 7.7 (L) 04/21/2022   HCT 26.9 (L) 04/21/2022   MCV 70.4 (L) 04/21/2022   PLT 310 04/21/2022   Lab Results   Component Value Date   FERRITIN 3 (L) 04/21/2022   IRON 13 (L) 04/21/2022   TIBC 406 04/21/2022   UIBC 393 04/21/2022   IRONPCTSAT 3 (L) 04/21/2022   Lab Results  Component Value Date   RETICCTPCT 1.1 04/21/2022   RBC 3.82 (L) 04/21/2022   RBC 3.79 (L) 04/21/2022   No results found for: "KPAFRELGTCHN", "LAMBDASER", "KAPLAMBRATIO" No results found for: "IGGSERUM", "IGA", "IGMSERUM" No results found for: "TOTALPROTELP", "ALBUMINELP", "A1GS", "A2GS", "BETS", "BETA2SER", "GAMS", "MSPIKE", "SPEI"   Chemistry      Component Value Date/Time   NA 141 04/21/2022 1506   NA 141 01/14/2020 1608   K 3.7 04/21/2022 1506   CL 107 04/21/2022 1506   CO2 27 04/21/2022 1506   BUN 21 04/21/2022 1506   BUN 17 01/14/2020 1608   CREATININE 0.55 04/21/2022 1506   CREATININE 0.77 09/16/2021 1456      Component Value Date/Time   CALCIUM 9.1 04/21/2022 1506   ALKPHOS 87 04/21/2022 1506   AST 10 (L) 04/21/2022 1506   ALT 11 04/21/2022 1506   BILITOT 0.3 04/21/2022 1506       Impression and Plan: Alexandria [name] is a very pleasant 61 yo caucasian female with history of iron deficiency anemia since childhood.  Iron studies pending.  Follow-up in 3 months.   Lottie Dawson, NP 7/20/202310:57 AM

## 2022-05-26 ENCOUNTER — Ambulatory Visit: Payer: BC Managed Care – PPO | Admitting: Family

## 2022-05-26 ENCOUNTER — Telehealth: Payer: Self-pay | Admitting: *Deleted

## 2022-05-26 ENCOUNTER — Other Ambulatory Visit: Payer: BC Managed Care – PPO

## 2022-05-26 NOTE — Telephone Encounter (Signed)
Per 05/25/22 los - called and gave upcoming appointments - confirmed

## 2022-05-26 NOTE — Telephone Encounter (Signed)
Call received from patient requesting that lab work for her last two visits at this office be faxed to Dr. Iran Planas at [phone].  Lab results faxed per pt.'s request.

## 2022-06-05 ENCOUNTER — Other Ambulatory Visit: Payer: Self-pay | Admitting: Surgery

## 2022-06-05 DIAGNOSIS — N6489 Other specified disorders of breast: Secondary | ICD-10-CM

## 2022-06-09 ENCOUNTER — Other Ambulatory Visit: Payer: Self-pay | Admitting: *Deleted

## 2022-06-09 ENCOUNTER — Other Ambulatory Visit: Payer: Self-pay | Admitting: Internal Medicine

## 2022-06-09 ENCOUNTER — Inpatient Hospital Stay: Payer: BC Managed Care – PPO | Attending: Hematology & Oncology

## 2022-06-09 DIAGNOSIS — K921 Melena: Secondary | ICD-10-CM

## 2022-06-09 DIAGNOSIS — D649 Anemia, unspecified: Secondary | ICD-10-CM

## 2022-06-09 DIAGNOSIS — D509 Iron deficiency anemia, unspecified: Secondary | ICD-10-CM | POA: Diagnosis present

## 2022-06-09 LAB — CMP (CANCER CENTER ONLY)
ALT: 17 U/L (ref 0–44)
AST: 13 U/L — ABNORMAL LOW (ref 15–41)
Albumin: 4.3 g/dL (ref 3.5–5.0)
Alkaline Phosphatase: 104 U/L (ref 38–126)
Anion gap: 7 (ref 5–15)
BUN: 21 mg/dL (ref 8–23)
CO2: 29 mmol/L (ref 22–32)
Calcium: 9.4 mg/dL (ref 8.9–10.3)
Chloride: 107 mmol/L (ref 98–111)
Creatinine: 0.86 mg/dL (ref 0.44–1.00)
GFR, Estimated: >60 mL/min (ref >60)
Glucose, Bld: 124 mg/dL — ABNORMAL HIGH (ref 70–99)
Potassium: 3.4 mmol/L — ABNORMAL LOW (ref 3.5–5.1)
Sodium: 143 mmol/L (ref 135–145)
Total Bilirubin: 0.4 mg/dL (ref 0.3–1.2)
Total Protein: 6.8 g/dL (ref 6.5–8.1)

## 2022-06-09 LAB — CBC WITH DIFFERENTIAL (CANCER CENTER ONLY)
Abs Immature Granulocytes: 0.06 10*3/uL (ref 0.00–0.07)
Basophils Absolute: 0.1 10*3/uL (ref 0.0–0.1)
Basophils Relative: 1 %
Eosinophils Absolute: 0.1 10*3/uL (ref 0.0–0.5)
Eosinophils Relative: 1 %
HCT: 36.7 % (ref 36.0–46.0)
Hemoglobin: 11.2 g/dL — ABNORMAL LOW (ref 12.0–15.0)
Immature Granulocytes: 1 %
Lymphocytes Relative: 16 %
Lymphs Abs: 1.1 10*3/uL (ref 0.7–4.0)
MCH: 24.6 pg — ABNORMAL LOW (ref 26.0–34.0)
MCHC: 30.5 g/dL (ref 30.0–36.0)
MCV: 80.5 fL (ref 80.0–100.0)
Monocytes Absolute: 0.4 10*3/uL (ref 0.1–1.0)
Monocytes Relative: 6 %
Neutro Abs: 5.2 10*3/uL (ref 1.7–7.7)
Neutrophils Relative %: 75 %
Platelet Count: 297 10*3/uL (ref 150–400)
RBC: 4.56 MIL/uL (ref 3.87–5.11)
WBC Count: 7 10*3/uL (ref 4.0–10.5)
nRBC: 0 % (ref 0.0–0.2)

## 2022-06-09 LAB — FERRITIN: Ferritin: 31 ng/mL (ref 11–307)

## 2022-06-09 LAB — IRON AND IRON BINDING CAPACITY (CC-WL,HP ONLY)
Iron: 58 ug/dL (ref 28–170)
Saturation Ratios: 18 % (ref 10.4–31.8)
TIBC: 329 ug/dL (ref 250–450)
UIBC: 271 ug/dL (ref 148–442)

## 2022-06-19 ENCOUNTER — Encounter: Payer: Self-pay | Admitting: Physician Assistant

## 2022-06-19 ENCOUNTER — Ambulatory Visit: Payer: BC Managed Care – PPO | Admitting: Physician Assistant

## 2022-06-19 VITALS — BP 134/78 | HR 62 | Ht 67.0 in | Wt 180.0 lb

## 2022-06-19 DIAGNOSIS — K449 Diaphragmatic hernia without obstruction or gangrene: Secondary | ICD-10-CM

## 2022-06-19 DIAGNOSIS — D509 Iron deficiency anemia, unspecified: Secondary | ICD-10-CM

## 2022-06-19 DIAGNOSIS — K2100 Gastro-esophageal reflux disease with esophagitis, without bleeding: Secondary | ICD-10-CM | POA: Diagnosis not present

## 2022-06-19 DIAGNOSIS — R195 Other fecal abnormalities: Secondary | ICD-10-CM

## 2022-06-19 NOTE — Patient Instructions (Signed)
CAPSULE ENDOSCOPY PATIENT INSTRUCTION Alexandria [name] May 12, 1961 [id number]   06/29/22- Seven (7) days prior to capsule endoscopy stop taking iron supplements and carafate.  07/04/22 Two (2) days prior to capsule endoscopy stop taking aspirin or any arthritis drugs.  07/05/22 Day before capsule endoscopy purchase a 238 gram bottle of Miralax from the laxative section of your drug store, and a 32 oz. bottle of Gatorade (no red).    07/05/22 One (1) day prior to capsule endoscopy: Stop smoking. Eat a regular diet until 12:00 Noon. After 12:00 Noon take only the following: Black coffee  Jell-O (no fruit or red Jell-o) Water   Bouillon (chicken or beef) 7-Up   Cranberry Juice Tea   Kool-Aid Popsicle (not red) Sprite   Coke Ginger Ale  Pepsi Mountain Dew Gatorade At 6:00 pm the evening before your appointment, drink 7 capfuls (105 grams) of Miralax with 32 oz. Gatorade. Drink 8 oz every 15 minutes until gone. Nothing to eat or drink after midnight except medications with a sip of water.  07/06/22 Day of capsule endoscopy:  No medications for 2 hours prior to your test.  Please arrive at [facility]  3rd floor patient registration area by 8:30 am on: Thursday 07/06/22.   For any questions: Call [place] at [phone] and ask to speak with one of the capsule endoscopy nurses.  YOU WILL NEED TO RETURN THE EQUIPMENT AT 4 PM ON THE DAY OF THE PROCEDURE.  PLEASE KEEP THIS IN MIND WHEN SCHEDULING.   Small Bowel Capsule Endoscopy  What you should know: Small Bowel capsule endoscopy is a procedure that takes pictures of the inside of your small intestine (bowel).  Your small bowel connects to your stomach on one end, and your large bowel (colon) on the other.  A capsule endoscopy is done by swallowing a pill size camera.  The capsule moves through your stomach and into your small bowel, where pictures are taken.   You may need a small bowel capsule endoscopy if you  have symptoms, such as blood in your stool, chronic stomach pain, and diarrhea.  The pictures may show if you have growths, swelling, and bleeding area in you small bowel.  A capsule endoscopy may also show if diseases such as Crohn's or celiac disease are causing your symptoms.  Having a small bowel capsule endoscopy may help you and your caregiver learn the cause of your symptoms.  Learning what is causing your symptoms allows you to receive needed treatment and prevent further problems. Risks: You may have stomach pain during your procedure.   The pictures taken by the capsule may not be clear.   The pictures may not show the cause of your symptoms.   You may need another endoscopy procedure.   The capsule may get trapped in your esophagus or intestines. You may need surgery or additional procedures to remove the capsule from your body.    Before your procedure: You will be instructed to stop certain prescription medications or over- the -counter medications prior to the procedure.   The day before your scheduled appointment you will need to be on a restricted diet and will need to drink a bowel prep that will clean out your bowels.   The day of the procedure: You may drive yourself to the procedure.   You will need to plan on 2 trips to the office on the day of the procedure. Morning: Plan to be at the office about 45 minutes. The  morning of the procedure a sensor belt and recorder will be placed on you.  You will wear this for 8 hours.  (The sensor belt transfers pictures of your small bowel to the recorder.)   You will be given a pill-sized capsule endoscope to swallow.  Once you swallow the capsule it will travel through your body the same way food does, constantly taking pictures along the way.  The capsule takes 2-3 pictures a second.   Once you have left the office you may go about your normal day with a few exceptions: You may not go near a MRI machine or a radio or television towers;  You need to avoid other patients having capsule endoscopy; You will be given a written diet to follow for the day.  Afternoon: You will need to be return to the office at your designated time. The sensors belt will be removed You will need to be at the office about 15 minutes.   REFERRAL:  A referral, your demographics, a copy of your insurance card and your records will be sent to [facility]. You will receive a call from their office regarding the date, time and location of your appointment.

## 2022-06-19 NOTE — Progress Notes (Signed)
Chief Complaint: Iron deficiency anemia and blood in stools  HPI:    Alexandria [name] is a 61 year old female, known to Dr. Lyndel Safe, with a past medical history as listed below including A-fib on Eliquis, GERD and breast cancer, who was referred to me by [name], * for a complaint of deficiency anemia.    04/20/2021 colonoscopy with 3 polyps and nonbleeding internal hemorrhoids.  Pathology showed hyperplastic polyps.  EGD done the same day with moderate hiatal hernia, single gastric polyp and gastritis.      05/01/2022 fecal occult blood card positive 1 out of 3.    05/25/2022 patient followed with oncology in regards to her iron deficiency anemia.  She had received an IV iron infusion and her hemoglobin was 10.8.  At that time they noted she had iron deficiency anemia since childhood.      Today, patient tells me that occasionally she will see some bright red blood in the toilet paper from hemorrhoids and feels like this was the cause.  Otherwise she has had no change of her chronic GERD symptoms or additional GI symptoms since having her procedures a year ago.  Tells me that she is about to have a lumpectomy and then breast repair surgery and also wants to get her hiatal hernia repaired.    Denies fever, chills, weight loss, abdominal pain or change in reflux symptoms or change in bowel habits.     Past Medical History:  Diagnosis Date   Anxiety    Atrial fibrillation ([place])    Depression    GERD (gastroesophageal reflux disease)    Graves disease    Hiatal hernia    a. seen on CT 12/2016.   Hyperlipidemia    Hypertension    NSVT (nonsustained ventricular tachycardia) (HCC)    Pulmonary nodules    a. seen on CT 12/2016.   Stroke [facility])    TIA (transient ischemic attack) 01/2017   a. tx at [facility]    Past Surgical History:  Procedure Laterality Date   ATRIAL FIBRILLATION ABLATION N/A 09/12/2018   Procedure: ATRIAL FIBRILLATION ABLATION;  Surgeon: Thompson Grayer, MD;   Location: [place] [facility];  Service: Cardiovascular;  Laterality: N/A;   CARPAL TUNNEL RELEASE      Current Outpatient Medications  Medication Sig Dispense Refill   acetaminophen (TYLENOL) 500 MG tablet Take 500 mg by mouth 2 (two) times daily as needed for moderate pain or headache.     ALPRAZolam (XANAX) 0.5 MG tablet TAKE 1/2 TO 1 TABLET BY MOUTH TWICE A DAY AS NEEDED (Patient taking differently: Take 0.125-0.25 mg by mouth 2 (two) times daily as needed for anxiety.) 30 tablet 1   atorvastatin (LIPITOR) 40 MG tablet TAKE 1 TABLET BY MOUTH EVERY DAY IN THE EVENING 90 tablet 3   diltiazem (CARDIZEM CD) 240 MG 24 hr capsule TAKE 1 CAPSULE BY MOUTH EVERY DAY 90 capsule 2   diltiazem (CARDIZEM) 30 MG tablet Take 1 tablet (30 mg total) by mouth daily as needed. For palpitations/heart rate above 110bpm 90 tablet 1   ELIQUIS 5 MG TABS tablet TAKE 1 TABLET BY MOUTH TWICE A DAY 60 tablet 6   famotidine (PEPCID) 40 MG tablet TAKE 1 TABLET BY MOUTH EVERYDAY AT BEDTIME 90 tablet 2   flecainide (TAMBOCOR) 100 MG tablet Take 1 tablet (100 mg total) by mouth 2 (two) times daily. 180 tablet 3   levothyroxine (SYNTHROID) 137 MCG tablet Take 137 mcg by mouth daily before  breakfast.     pantoprazole (PROTONIX) 40 MG tablet Take 1 tablet (40 mg total) by mouth daily. Please call [phone] to schedule an office visit for more refills (Patient taking differently: Take 40 mg by mouth 2 (two) times daily. Please call [phone] to schedule an office visit for more refills) 90 tablet 1   Iron, Ferrous Sulfate, 325 (65 Fe) MG TABS Take 325 mg by mouth daily. (Patient not taking: Reported on 06/19/2022) 30 tablet 5   Semaglutide-Weight Management (WEGOVY) 0.25 MG/0.5ML SOAJ Inject 0.25 mg into the skin once a week. (Patient not taking: Reported on 06/19/2022) 2 mL 0   No current facility-administered medications for this visit.    Allergies as of 06/19/2022 - Review Complete 06/19/2022  Allergen Reaction Noted    Peanut-containing drug products Swelling 01/05/2012    Family History  Problem Relation Age of Onset   Coronary artery disease Father    Atrial fibrillation Father    Diabetes Maternal Grandmother    Hyperlipidemia Mother    Dementia Mother    Coronary artery disease Paternal Grandfather    Coronary artery disease Paternal Uncle    Colon polyps Brother        says he goes every year to have a colonoscopy   Colon cancer Neg Hx    Esophageal cancer Neg Hx     Social History   Socioeconomic History   Marital status: Divorced    Spouse name: Not on file   Number of children: 4   Years of education: Not on file   Highest education level: Not on file  Occupational History   Occupation: Music therapist  Tobacco Use   Smoking status: Never   Smokeless tobacco: Never  Vaping Use   Vaping Use: Never used  Substance and Sexual Activity   Alcohol use: Yes    Alcohol/week: 1.0 standard drink of alcohol    Types: 1 Glasses of wine per week    Comment: occasionally,    Drug use: No   Sexual activity: Yes  Other Topics Concern   Not on file  Social History Narrative   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   Social Determinants of Health   Financial Resource Strain: Not on file  Food Insecurity: Not on file  Transportation Needs: Not on file  Physical Activity: Not on file  Stress: Not on file  Social Connections: Not on file  Intimate Partner Violence: Not on file    Review of Systems:    Constitutional: No weight loss, fever or chills Cardiovascular: No chest pain Respiratory: No SOB  Gastrointestinal: See HPI and otherwise negative   Physical Exam:  Vital signs: BP 134/78   Pulse 62   Ht '5\' 7"'$  (1.702 m)   Wt 180 lb (81.6 kg)   LMP 01/26/2012   SpO2 97%   BMI 28.19 kg/m    Constitutional:   Pleasant Caucasian female appears to be in NAD, Well developed, Well nourished, alert and cooperative Respiratory: Respirations even and unlabored. Lungs clear  to auscultation bilaterally.   No wheezes, crackles, or rhonchi.  Cardiovascular: Normal S1, S2. No MRG. Regular rate and rhythm. No peripheral edema, cyanosis or pallor.  Gastrointestinal:  Soft, nondistended, nontender. No rebound or guarding. Normal bowel sounds. No appreciable masses or hepatomegaly. Rectal:  Not performed.  Psychiatric: Demonstrates good judgement and reason without abnormal affect or behaviors.  RELEVANT LABS AND IMAGING: CBC    Component Value Date/Time   WBC 7.0 06/09/2022  1137   WBC 4.3 04/18/2022 1129   RBC 4.56 06/09/2022 1137   HGB 11.2 (L) 06/09/2022 1137   HGB 11.7 01/14/2020 1608   HCT 36.7 06/09/2022 1137   HCT 36.3 01/14/2020 1608   PLT 297 06/09/2022 1137   PLT 299 01/14/2020 1608   MCV 80.5 06/09/2022 1137   MCV 88 01/14/2020 1608   MCH 24.6 (L) 06/09/2022 1137   MCHC 30.5 06/09/2022 1137   RDW Not Measured 06/09/2022 1137   RDW 13.6 01/14/2020 1608   LYMPHSABS 1.1 06/09/2022 1137   LYMPHSABS 1.6 09/05/2018 1317   MONOABS 0.4 06/09/2022 1137   EOSABS 0.1 06/09/2022 1137   EOSABS 0.1 09/05/2018 1317   BASOSABS 0.1 06/09/2022 1137   BASOSABS 0.1 09/05/2018 1317    CMP     Component Value Date/Time   NA 143 06/09/2022 1137   NA 141 01/14/2020 1608   K 3.4 (L) 06/09/2022 1137   CL 107 06/09/2022 1137   CO2 29 06/09/2022 1137   GLUCOSE 124 (H) 06/09/2022 1137   BUN 21 06/09/2022 1137   BUN 17 01/14/2020 1608   CREATININE 0.86 06/09/2022 1137   CREATININE 0.77 09/16/2021 1456   CALCIUM 9.4 06/09/2022 1137   PROT 6.8 06/09/2022 1137   ALBUMIN 4.3 06/09/2022 1137   AST 13 (L) 06/09/2022 1137   ALT 17 06/09/2022 1137   ALKPHOS 104 06/09/2022 1137   BILITOT 0.4 06/09/2022 1137   GFRNONAA >60 06/09/2022 1137   GFRAA 57 (L) 01/14/2020 1608    Assessment: 1.  IDA: Recent EGD and colonoscopy reviewed, patient would benefit from small bowel capsule endoscopy 2.  Positive Hemoccult: Recent check by oncology given ongoing IDA, patient has  been anemic since her youth and had recent EGD and colonoscopy only a year ago with no identifiable cause; likely this was from hemorrhoids 3.  Moderate hiatal hernia  Plan: 1.  Patient requested referral for hiatal hernia repair.  Sent referral to [facility] surgical team. 2.  Discussed with patient that she had recent EGD and colonoscopy only a year ago and was anemic at that time with no definitive cause.  To finish our work-up would recommend a pill capsule endoscopy to rule out small bowel etiology. 3.  Ordered capsule endoscopy. 4.  Will follow in clinic with [place] per recommendations after time of capsule  Ellouise Newer, PA-C [facility] 06/19/2022, 1:47 PM  Cc: Carollee Herter, Alferd Apa, *

## 2022-06-20 ENCOUNTER — Ambulatory Visit: Payer: Self-pay | Admitting: Surgery

## 2022-06-20 DIAGNOSIS — N6489 Other specified disorders of breast: Secondary | ICD-10-CM

## 2022-06-20 NOTE — Pre-Procedure Instructions (Signed)
Surgical Instructions    Your procedure is scheduled on Wednesday, August 23rd.  Report to [facility] Main Entrance "A" at 10:30 A.M., then check in with the Admitting office.  Call this number if you have problems the morning of surgery:  [phone]   If you have any questions prior to your surgery date call [phone]: Open Monday-Friday 8am-4pm    Remember:  Do not eat after midnight the night before your surgery  You may drink clear liquids until 09:30 AM the morning of your surgery.   Clear liquids allowed are: Water, Non-Citrus Juices (without pulp), Carbonated Beverages, Clear Tea, Black Coffee Only (NO MILK, CREAM OR POWDERED CREAMER of any kind), and Gatorade.    Take these medicines the morning of surgery with A SIP OF WATER  diltiazem (CARDIZEM CD)  flecainide (TAMBOCOR)  levothyroxine (SYNTHROID) pantoprazole (PROTONIX)   If needed: acetaminophen (TYLENOL)  ALPRAZolam (XANAX)   Follow your surgeon's instructions on when to stop Eliquis.  If no instructions were given by your surgeon then you will need to call the office to get those instructions.     As of today, STOP taking any Aspirin (unless otherwise instructed by your surgeon) Aleve, Naproxen, Ibuprofen, Motrin, Advil, Goody's, BC's, all herbal medications, fish oil, and all vitamins.                     Do NOT Smoke (Tobacco/Vaping) for 24 hours prior to your procedure.  If you use a CPAP at night, you may bring your mask/headgear for your overnight stay.   Contacts, glasses, piercing's, hearing aid's, dentures or partials may not be worn into surgery, please bring cases for these belongings.    For patients admitted to the hospital, discharge time will be determined by your treatment team.   Patients discharged the day of surgery will not be allowed to drive home, and someone needs to stay with them for 24 hours.  SURGICAL WAITING ROOM VISITATION Patients having surgery or a procedure may have no  more than 2 support people in the waiting area - these visitors may rotate.   Children under the age of 39 must have an adult with them who is not the patient. If the patient needs to stay at the hospital during part of their recovery, the visitor guidelines for inpatient rooms apply. Pre-op nurse will coordinate an appropriate time for 1 support person to accompany patient in pre-op.  This support person may not rotate.   Please refer to the [facility] website for the visitor guidelines for Inpatients (after your surgery is over and you are in a regular room).    Special instructions:   [place]- Preparing For Surgery  Before surgery, you can play an important role. Because skin is not sterile, your skin needs to be as free of germs as possible. You can reduce the number of germs on your skin by washing with CHG (chlorahexidine gluconate) Soap before surgery.  CHG is an antiseptic cleaner which kills germs and bonds with the skin to continue killing germs even after washing.    Oral Hygiene is also important to reduce your risk of infection.  Remember - BRUSH YOUR TEETH THE MORNING OF SURGERY WITH YOUR REGULAR TOOTHPASTE  Please do not use if you have an allergy to CHG or antibacterial soaps. If your skin becomes reddened/irritated stop using the CHG.  Do not shave (including legs and underarms) for at least 48 hours prior to first CHG shower. It is  OK to shave your face.  Please follow these instructions carefully.   Shower the NIGHT BEFORE SURGERY and the MORNING OF SURGERY  If you chose to wash your hair, wash your hair first as usual with your normal shampoo.  After you shampoo, rinse your hair and body thoroughly to remove the shampoo.  Use CHG Soap as you would any other liquid soap. You can apply CHG directly to the skin and wash gently with a scrungie or a clean washcloth.   Apply the CHG Soap to your body ONLY FROM THE NECK DOWN.  Do not use on open wounds or open sores.  Avoid contact with your eyes, ears, mouth and genitals (private parts). Wash Face and genitals (private parts)  with your normal soap.   Wash thoroughly, paying special attention to the area where your surgery will be performed.  Thoroughly rinse your body with warm water from the neck down.  DO NOT shower/wash with your normal soap after using and rinsing off the CHG Soap.  Pat yourself dry with a CLEAN TOWEL.  Wear CLEAN PAJAMAS to bed the night before surgery  Place CLEAN SHEETS on your bed the night before your surgery  DO NOT SLEEP WITH PETS.   Day of Surgery: Take a shower with CHG soap. Do not wear jewelry or makeup Do not wear lotions, powders, perfumes, or deodorant. Do not shave 48 hours prior to surgery.   Do not bring valuables to the hospital. [facility] is not responsible for any belongings or valuables. Do not wear nail polish, gel polish, artificial nails, or any other type of covering on natural nails (fingers and toes) If you have artificial nails or gel coating that need to be removed by a nail salon, please have this removed prior to surgery. Artificial nails or gel coating may interfere with anesthesia's ability to adequately monitor your vital signs. Wear Clean/Comfortable clothing the morning of surgery Remember to brush your teeth WITH YOUR REGULAR TOOTHPASTE.   Please read over the following fact sheets that you were given.    If you received a COVID test during your pre-op visit  it is requested that you wear a mask when out in public, stay away from anyone that may not be feeling well and notify your surgeon if you develop symptoms. If you have been in contact with anyone that has tested positive in the last 10 days please notify you surgeon.

## 2022-06-21 ENCOUNTER — Encounter (HOSPITAL_COMMUNITY)
Admission: RE | Admit: 2022-06-21 | Discharge: 2022-06-21 | Disposition: A | Discharge status: Home / Self Care | Payer: BC Managed Care – PPO | Source: Ambulatory Visit | Attending: Surgery | Admitting: Surgery

## 2022-06-21 ENCOUNTER — Other Ambulatory Visit: Payer: Self-pay

## 2022-06-21 ENCOUNTER — Encounter (HOSPITAL_COMMUNITY): Payer: Self-pay

## 2022-06-21 VITALS — BP 117/59 | HR 58 | Temp 98.0°F | Resp 18 | Ht 67.0 in | Wt 180.6 lb

## 2022-06-21 DIAGNOSIS — Z01812 Encounter for preprocedural laboratory examination: Secondary | ICD-10-CM | POA: Insufficient documentation

## 2022-06-21 DIAGNOSIS — D508 Other iron deficiency anemias: Secondary | ICD-10-CM | POA: Insufficient documentation

## 2022-06-21 DIAGNOSIS — I480 Paroxysmal atrial fibrillation: Secondary | ICD-10-CM | POA: Insufficient documentation

## 2022-06-21 HISTORY — DX: Anemia, unspecified: D64.9

## 2022-06-21 HISTORY — DX: Hypothyroidism, unspecified: E03.9

## 2022-06-21 LAB — CBC
HCT: 36.5 % (ref 36.0–46.0)
Hemoglobin: 11.7 g/dL — ABNORMAL LOW (ref 12.0–15.0)
MCH: 25.8 pg — ABNORMAL LOW (ref 26.0–34.0)
MCHC: 32.1 g/dL (ref 30.0–36.0)
MCV: 80.4 fL (ref 80.0–100.0)
Platelets: 307 10*3/uL (ref 150–400)
RBC: 4.54 MIL/uL (ref 3.87–5.11)
WBC: 6.9 10*3/uL (ref 4.0–10.5)
nRBC: 0 % (ref 0.0–0.2)

## 2022-06-21 LAB — BASIC METABOLIC PANEL
Anion gap: 6 (ref 5–15)
BUN: 17 mg/dL (ref 8–23)
CO2: 27 mmol/L (ref 22–32)
Calcium: 8.7 mg/dL — ABNORMAL LOW (ref 8.9–10.3)
Chloride: 107 mmol/L (ref 98–111)
Creatinine, Ser: 0.95 mg/dL (ref 0.44–1.00)
GFR, Estimated: >60 mL/min (ref >60)
Glucose, Bld: 118 mg/dL — ABNORMAL HIGH (ref 70–99)
Potassium: 3.7 mmol/L (ref 3.5–5.1)
Sodium: 140 mmol/L (ref 135–145)

## 2022-06-21 NOTE — Progress Notes (Signed)
PCP - Dr. Garnet Koyanagi [facility] Cardiologist - Dr Thompson Grayer (Pt has upcoming appt on 8/18 for clearance and Eliquis instructions)  PPM/ICD - denies   Chest x-ray - 04/04/18 EKG - 02/17/22 Stress Test - 04/23/18 ECHO - 04/05/18 Cardiac Cath - Cardiac CT/CTA 09/05/18  Sleep Study - 2019, OSA- per pt CPAP - n/a  DM- denies  Blood Thinner Instructions: pt sees Dr. Rayann Heman 8/18 and will obtain Eliquis instructions Aspirin Instructions: n/a  ERAS Protcol - yes, no drink   COVID TEST- n/a   Anesthesia review: yes, cardiac hx  Patient denies shortness of breath, fever, cough and chest pain at PAT appointment   All instructions explained to the patient, with a verbal understanding of the material. Patient agrees to go over the instructions while at home for a better understanding.  The opportunity to ask questions was provided.

## 2022-06-22 NOTE — Progress Notes (Addendum)
Anesthesia Chart Review:  Follows with EP cardiology for history of atrial fibrillation s/p ablation 2019 on Eliquis, TIA, HTN, HLD.  Low risk nuclear stress 04/2018.  EF 60 to 65% by echo 03/2018.  When seen by Dr. Rayann Heman 02/17/2022, flecainide was increased to 100 mg twice daily for recurrent A-fib.  Recommended consider repeat ablation if she continues to have A-fib despite dose escalation.  She followed up with Dr. Rayann Heman on 06/23/2022 and was noted to be doing well on increased dose of flecainide.  Upcoming surgery was also addressed.  Per note, "Ok to proceed with breast biopsy/ surgery without further CV testing.  Ok to hold eliquis 48-72 hours prior to the surgery and resume as soon as able post operatively."  Non-insulin-dependent DM2, last A1c 6.4 on 04/18/2022.  Preop labs reviewed, unremarkable.  EKG 02/17/2022: Sinus bradycardia.  Rate 56.  Nuclear stress 04/23/2018: Nuclear stress EF: 63%. The left ventricular ejection fraction is normal (55-65%). Blood pressure demonstrated a normal response to exercise. Upsloping ST segment depression ST segment depression of 1 mm was noted during stress in the II, III, aVF, V4 and V5 leads, and returning to baseline after less than 1 minute of recovery. This is a low risk study.   1. EF 63%, normal wall motion.  2. No evidence for ischemia or infarction on perfusion images.   3. Nonspecific upsloping ST depression in several leads.     Low risk study based on perfusion images.   TTE 04/05/2018: - Left ventricle: The cavity size was normal. Systolic function was    normal. The estimated ejection fraction was in the range of 60%    to 65%. Wall motion was normal; there were no regional wall    motion abnormalities. Left ventricular diastolic function    parameters were normal.  - Mitral valve: There was mild regurgitation.  - Left atrium: The atrium was mildly dilated.  - Atrial septum: No defect or patent foramen ovale was identified.  -  Impressions: Appears to be some shadowing artifact in LA on    subcostal images.   Impressions:   - Appears to be some shadowing artifact in LA on subcostal images.     Wynonia Musty [facility]/Anesthesiology Phone [phone] 06/23/2022 3:15 PM

## 2022-06-23 ENCOUNTER — Encounter: Payer: Self-pay | Admitting: Internal Medicine

## 2022-06-23 ENCOUNTER — Encounter: Payer: Self-pay | Admitting: Family

## 2022-06-23 ENCOUNTER — Ambulatory Visit: Payer: BC Managed Care – PPO | Admitting: Internal Medicine

## 2022-06-23 VITALS — BP 118/68 | HR 77 | Ht 67.0 in | Wt 180.0 lb

## 2022-06-23 DIAGNOSIS — I480 Paroxysmal atrial fibrillation: Secondary | ICD-10-CM | POA: Diagnosis not present

## 2022-06-23 DIAGNOSIS — D6869 Other thrombophilia: Secondary | ICD-10-CM | POA: Diagnosis not present

## 2022-06-23 DIAGNOSIS — I10 Essential (primary) hypertension: Secondary | ICD-10-CM | POA: Diagnosis not present

## 2022-06-23 NOTE — Patient Instructions (Addendum)
Medication Instructions:  Your physician has recommended you make the following change in your medication:   OK TO HOLD YOUR ELIQUIS 48 TO 72 HOURS PRIOR TO YOUR UPCOMING PROCEDURE;  PER DR J. ALLRED.  Lab Work: None ordered.  If you have labs (blood work) drawn today and your tests are completely normal, you will receive your results only by: [place] (if you have MyChart) OR A paper copy in the mail If you have any lab test that is abnormal or we need to change your treatment, we will call you to review the results.  Testing/Procedures: None ordered.  Follow-Up:  IN 6 MONTHS WITH THE A-FIB CLINIC.    Important Information About Sugar

## 2022-06-23 NOTE — Progress Notes (Signed)
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 female who presents today for routine electrophysiology followup.  Since last being seen in our clinic, the patient reports doing very well.  Today, she denies symptoms of palpitations, chest pain, shortness of breath,  lower extremity edema, dizziness, presyncope, or syncope.  She has been diagnosed with a breast mass and has surgery pending.  The patient is otherwise without complaint today.   Past Medical History:  Diagnosis Date   Anemia    hx of iron deficiency anemia (pt had iron infusions)   Anxiety    Atrial fibrillation (HCC)    Depression    GERD (gastroesophageal reflux disease)    Graves disease    Hiatal hernia    a. seen on CT 12/2016.   Hyperlipidemia    Hypertension    Hypothyroidism    pt had radioiodine treatment on thyroid   NSVT (nonsustained ventricular tachycardia) (HCC)    Pulmonary nodules    a. seen on CT 12/2016.   Stroke [facility])    TIA (transient ischemic attack) 01/2017   a. tx at [facility].   Past Surgical History:  Procedure Laterality Date   ATRIAL FIBRILLATION ABLATION N/A 09/12/2018   Procedure: ATRIAL FIBRILLATION ABLATION;  Surgeon: Thompson Grayer, MD;  Location: [facility];  Service: Cardiovascular;  Laterality: N/A;   CARPAL TUNNEL RELEASE Right 1991   DILATION AND CURETTAGE OF UTERUS  2018   THYROID SURGERY  2012   radioactive iodine swallowed   WISDOM TOOTH EXTRACTION  1980    ROS- all systems are reviewed and negatives except as per HPI above  Current Outpatient Medications  Medication Sig Dispense Refill   acetaminophen (TYLENOL) 500 MG tablet Take 500 mg by mouth 2 (two) times daily as needed for moderate pain or headache.     ALPRAZolam (XANAX) 0.5 MG tablet TAKE 1/2 TO 1 TABLET BY MOUTH TWICE A DAY AS NEEDED (Patient taking differently: Take 0.125-0.25 mg by mouth 2 (two) times daily as needed for anxiety.) 30 tablet 1   atorvastatin  (LIPITOR) 40 MG tablet TAKE 1 TABLET BY MOUTH EVERY DAY IN THE EVENING 90 tablet 3   diltiazem (CARDIZEM CD) 240 MG 24 hr capsule TAKE 1 CAPSULE BY MOUTH EVERY DAY 90 capsule 2   diltiazem (CARDIZEM) 30 MG tablet Take 1 tablet (30 mg total) by mouth daily as needed. For palpitations/heart rate above 110bpm 90 tablet 1   ELIQUIS 5 MG TABS tablet TAKE 1 TABLET BY MOUTH TWICE A DAY 60 tablet 6   famotidine (PEPCID) 40 MG tablet TAKE 1 TABLET BY MOUTH EVERYDAY AT BEDTIME 90 tablet 2   flecainide (TAMBOCOR) 100 MG tablet Take 1 tablet (100 mg total) by mouth 2 (two) times daily. 180 tablet 3   levothyroxine (SYNTHROID) 137 MCG tablet Take 137 mcg by mouth daily before breakfast.     pantoprazole (PROTONIX) 40 MG tablet Take 1 tablet (40 mg total) by mouth daily. Please call [phone] to schedule an office visit for more refills (Patient taking differently: Take 40 mg by mouth 2 (two) times daily. Please call [phone] to schedule an office visit for more refills) 90 tablet 1   Iron, Ferrous Sulfate, 325 (65 Fe) MG TABS Take 325 mg by mouth daily. (Patient not taking: Reported on 06/19/2022) 30 tablet 5   Semaglutide-Weight Management (WEGOVY) 0.25 MG/0.5ML SOAJ Inject 0.25 mg into the skin once a  week. (Patient not taking: Reported on 06/19/2022) 2 mL 0   No current facility-administered medications for this visit.    Physical Exam: Vitals:   06/23/22 1452  BP: 118/68  Pulse: 77  SpO2: 96%  Weight: 180 lb (81.6 kg)  Height: '5\' 7"'$  (1.702 m)     GEN- The patient is well appearing, alert and oriented x 3 today.   Head- normocephalic, atraumatic Eyes-  Sclera clear, conjunctiva pink Ears- hearing intact Oropharynx- clear Lungs- Clear to ausculation bilaterally, normal work of breathing Heart- Regular rate and rhythm, no murmurs, rubs or gallops, PMI not laterally displaced GI- soft, NT, ND, + BS Extremities- no clubbing, cyanosis, or edema  Wt Readings from Last 3 Encounters:  06/23/22  180 lb (81.6 kg)  06/21/22 180 lb 9.6 oz (81.9 kg)  06/19/22 180 lb (81.6 kg)    EKG tracing ordered today is personally reviewed and shows sinus rhythm 65 bpm, PR 200 msec  Assessment and Plan:  Paroxysmal atrial fibrillation Currently well controlled post ablation with addition of flecainide. Chads2vasc score is 4.  She is on eliquis.  2. HTN Stable No change required today  3. Preop Ok to proceed with breast biopsy/ surgery without further CV testing.  Ok to hold eliquis 48-72 hours prior to the surgery and resume as soon as able post operatively.  Risks, benefits and potential toxicities for medications prescribed and/or refilled reviewed with patient today.   Follow-up in AF clinic in 6 months  Thompson Grayer MD, [facility] 06/23/2022 3:02 PM

## 2022-06-23 NOTE — Anesthesia Preprocedure Evaluation (Signed)
Anesthesia Evaluation  Patient identified by MRN, date of birth, ID band Patient awake    Reviewed: Allergy & Precautions, NPO status , Patient's Chart, lab work & pertinent test results  Airway Mallampati: II  TM Distance: >3 FB Neck ROM: Full    Dental  (+) Dental Advisory Given   Pulmonary neg pulmonary ROS,    breath sounds clear to auscultation       Cardiovascular hypertension, Pt. on medications  Rhythm:Regular Rate:Normal     Neuro/Psych TIACVA    GI/Hepatic Neg liver ROS, hiatal hernia, GERD  Medicated and Controlled,  Endo/Other  Hypothyroidism   Renal/GU negative Renal ROS     Musculoskeletal   Abdominal   Peds  Hematology negative hematology ROS (+)   Anesthesia Other Findings   Reproductive/Obstetrics                            Lab Results  Component Value Date   WBC 6.9 06/21/2022   HGB 11.7 (L) 06/21/2022   HCT 36.5 06/21/2022   MCV 80.4 06/21/2022   PLT 307 06/21/2022   Lab Results  Component Value Date   CREATININE 0.95 06/21/2022   BUN 17 06/21/2022   NA 140 06/21/2022   K 3.7 06/21/2022   CL 107 06/21/2022   CO2 27 06/21/2022    Anesthesia Physical Anesthesia Plan  ASA: 3  Anesthesia Plan: General   Post-op Pain Management: Tylenol PO (pre-op)* and Celebrex PO (pre-op)*   Induction: Intravenous  PONV Risk Score and Plan: 3 and Dexamethasone, Ondansetron and Treatment may vary due to age or medical condition  Airway Management Planned: LMA  Additional Equipment:   Intra-op Plan:   Post-operative Plan: Extubation in OR  Informed Consent: I have reviewed the patients History and Physical, chart, labs and discussed the procedure including the risks, benefits and alternatives for the proposed anesthesia with the patient or authorized representative who has indicated his/her understanding and acceptance.     Dental advisory given  Plan Discussed  with: CRNA  Anesthesia Plan Comments: (PAT note by Karoline Caldwell, PA-C: Follows with EP cardiology for history of atrial fibrillation s/p ablation 2019 on Eliquis, TIA, HTN, HLD.  Low risk nuclear stress 04/2018.  EF 60 to 65% by echo 03/2018.  When seen by Dr. Rayann Heman 02/17/2022, flecainide was increased to 100 mg twice daily for recurrent A-fib.  Recommended consider repeat ablation if she continues to have A-fib despite dose escalation.  She followed up with Dr. Rayann Heman on 06/23/2022 and was noted to be doing well on increased dose of flecainide.  Upcoming surgery was also addressed.  Per note, "Ok to proceed with breast biopsy/ surgery without further CV testing. Ok to hold eliquis 48-72 hours prior to the surgery and resume as soon as able post operatively."  Non-insulin-dependent DM2, last A1c 6.4 on 04/18/2022.  Preop labs reviewed, unremarkable.  EKG 02/17/2022: Sinus bradycardia.  Rate 56.  Nuclear stress 04/23/2018: . Nuclear stress EF: 63%. . The left ventricular ejection fraction is normal (55-65%). . Blood pressure demonstrated a normal response to exercise. Marland Kitchen Upsloping ST segment depression ST segment depression of 1 mm was noted during stress in the II, III, aVF, V4 and V5 leads, and returning to baseline after less than 1 minute of recovery. . This is a low risk study.  1. EF 63%, normal wall motion.  2. No evidence for ischemia or infarction on perfusion images.  3. Nonspecific upsloping ST  depression in several leads.   Low risk study based on perfusion images.   TTE 04/05/2018: - Left ventricle: The cavity size was normal. Systolic function was  normal. The estimated ejection fraction was in the range of 60%  to 65%. Wall motion was normal; there were no regional wall  motion abnormalities. Left ventricular diastolic function  parameters were normal.  - Mitral valve: There was mild regurgitation.  - Left atrium: The atrium was mildly dilated.  - Atrial septum: No  defect or patent foramen ovale was identified.  - Impressions: Appears to be some shadowing artifact in LA on  subcostal images.   Impressions:   - Appears to be some shadowing artifact in LA on subcostal images.  )       Anesthesia Quick Evaluation

## 2022-06-25 NOTE — Progress Notes (Signed)
Agree with assessment/plan.  Raj Nikea Settle, MD [facility] [phone]  

## 2022-06-27 ENCOUNTER — Ambulatory Visit
Admission: RE | Admit: 2022-06-27 | Discharge: 2022-06-27 | Disposition: A | Discharge status: Home / Self Care | Payer: BC Managed Care – PPO | Source: Ambulatory Visit | Attending: Surgery | Admitting: Surgery

## 2022-06-27 DIAGNOSIS — N6489 Other specified disorders of breast: Secondary | ICD-10-CM

## 2022-06-28 ENCOUNTER — Ambulatory Visit (HOSPITAL_COMMUNITY): Payer: BC Managed Care – PPO | Admitting: Physician Assistant

## 2022-06-28 ENCOUNTER — Ambulatory Visit (HOSPITAL_COMMUNITY)
Admission: RE | Admit: 2022-06-28 | Discharge: 2022-06-28 | Disposition: A | Discharge status: Home / Self Care | Payer: BC Managed Care – PPO | Attending: Surgery | Admitting: Surgery

## 2022-06-28 ENCOUNTER — Encounter (HOSPITAL_COMMUNITY): Payer: Self-pay | Admitting: Surgery

## 2022-06-28 ENCOUNTER — Ambulatory Visit (HOSPITAL_COMMUNITY): Payer: BC Managed Care – PPO | Admitting: General Practice

## 2022-06-28 ENCOUNTER — Other Ambulatory Visit: Payer: Self-pay

## 2022-06-28 ENCOUNTER — Ambulatory Visit
Admission: RE | Admit: 2022-06-28 | Discharge: 2022-06-28 | Disposition: A | Discharge status: Home / Self Care | Payer: BC Managed Care – PPO | Source: Ambulatory Visit | Attending: Surgery | Admitting: Surgery

## 2022-06-28 ENCOUNTER — Encounter (HOSPITAL_COMMUNITY)
Admission: RE | Disposition: A | Discharge status: Home / Self Care | Payer: Self-pay | Source: Home / Self Care | Attending: Surgery

## 2022-06-28 DIAGNOSIS — N6489 Other specified disorders of breast: Secondary | ICD-10-CM

## 2022-06-28 DIAGNOSIS — L905 Scar conditions and fibrosis of skin: Secondary | ICD-10-CM | POA: Insufficient documentation

## 2022-06-28 DIAGNOSIS — Z79899 Other long term (current) drug therapy: Secondary | ICD-10-CM | POA: Diagnosis not present

## 2022-06-28 DIAGNOSIS — E039 Hypothyroidism, unspecified: Secondary | ICD-10-CM | POA: Insufficient documentation

## 2022-06-28 DIAGNOSIS — K219 Gastro-esophageal reflux disease without esophagitis: Secondary | ICD-10-CM | POA: Insufficient documentation

## 2022-06-28 DIAGNOSIS — I10 Essential (primary) hypertension: Secondary | ICD-10-CM | POA: Insufficient documentation

## 2022-06-28 HISTORY — PX: BREAST LUMPECTOMY WITH RADIOACTIVE SEED LOCALIZATION: SHX6424

## 2022-06-28 SURGERY — BREAST LUMPECTOMY WITH RADIOACTIVE SEED LOCALIZATION
Anesthesia: General | Site: Breast | Laterality: Right

## 2022-06-28 MED ORDER — CHLORHEXIDINE GLUCONATE CLOTH 2 % EX PADS: 6.0000 | MEDICATED_PAD | Freq: Once | CUTANEOUS | Status: DC

## 2022-06-28 MED ORDER — PROPOFOL 10 MG/ML IV BOLUS
INTRAVENOUS | Status: DC | PRN
  Administered 2022-06-28: 200 mg via INTRAVENOUS

## 2022-06-28 MED ORDER — LIDOCAINE 2% (20 MG/ML) 5 ML SYRINGE
INTRAMUSCULAR | Status: DC | PRN
  Administered 2022-06-28: 60 mg via INTRAVENOUS

## 2022-06-28 MED ORDER — FENTANYL CITRATE (PF) 250 MCG/5ML IJ SOLN
INTRAMUSCULAR | Status: DC | PRN
  Administered 2022-06-28: 50 ug via INTRAVENOUS
  Administered 2022-06-28 (×2): 25 ug via INTRAVENOUS

## 2022-06-28 MED ORDER — ACETAMINOPHEN 500 MG PO TABS
1000.0000 mg | ORAL_TABLET | ORAL | Status: AC
  Administered 2022-06-28: 500 mg via ORAL
  Filled 2022-06-28: qty 2

## 2022-06-28 MED ORDER — AMISULPRIDE (ANTIEMETIC) 5 MG/2ML IV SOLN: 10.0000 mg | Freq: Once | INTRAVENOUS | Status: DC | PRN

## 2022-06-28 MED ORDER — CEFAZOLIN SODIUM-DEXTROSE 2-4 GM/100ML-% IV SOLN
2.0000 g | INTRAVENOUS | Status: AC
  Administered 2022-06-28: 2 g via INTRAVENOUS
  Filled 2022-06-28: qty 100

## 2022-06-28 MED ORDER — BUPIVACAINE-EPINEPHRINE (PF) 0.25% -1:200000 IJ SOLN
INTRAMUSCULAR | Status: AC
  Filled 2022-06-28: qty 30

## 2022-06-28 MED ORDER — MIDAZOLAM HCL 2 MG/2ML IJ SOLN
INTRAMUSCULAR | Status: DC | PRN
  Administered 2022-06-28: 2 mg via INTRAVENOUS

## 2022-06-28 MED ORDER — PROPOFOL 10 MG/ML IV BOLUS
INTRAVENOUS | Status: AC
  Filled 2022-06-28: qty 20

## 2022-06-28 MED ORDER — PHENYLEPHRINE 80 MCG/ML (10ML) SYRINGE FOR IV PUSH (FOR BLOOD PRESSURE SUPPORT)
PREFILLED_SYRINGE | INTRAVENOUS | Status: AC
  Filled 2022-06-28: qty 10

## 2022-06-28 MED ORDER — DEXAMETHASONE SODIUM PHOSPHATE 10 MG/ML IJ SOLN
INTRAMUSCULAR | Status: AC
  Filled 2022-06-28: qty 2

## 2022-06-28 MED ORDER — 0.9 % SODIUM CHLORIDE (POUR BTL) OPTIME
TOPICAL | Status: DC | PRN
  Administered 2022-06-28: 1000 mL

## 2022-06-28 MED ORDER — ONDANSETRON HCL 4 MG/2ML IJ SOLN
INTRAMUSCULAR | Status: DC | PRN
  Administered 2022-06-28: 4 mg via INTRAVENOUS

## 2022-06-28 MED ORDER — EPHEDRINE SULFATE-NACL 50-0.9 MG/10ML-% IV SOSY
PREFILLED_SYRINGE | INTRAVENOUS | Status: DC | PRN
  Administered 2022-06-28: 5 mg via INTRAVENOUS
  Administered 2022-06-28: 2.5 mg via INTRAVENOUS
  Administered 2022-06-28: 5 mg via INTRAVENOUS
  Administered 2022-06-28: 2.5 mg via INTRAVENOUS
  Administered 2022-06-28 (×2): 5 mg via INTRAVENOUS

## 2022-06-28 MED ORDER — ORAL CARE MOUTH RINSE: 15.0000 mL | Freq: Once | OROMUCOSAL | Status: AC

## 2022-06-28 MED ORDER — CHLORHEXIDINE GLUCONATE 0.12 % MT SOLN
15.0000 mL | Freq: Once | OROMUCOSAL | Status: AC
  Administered 2022-06-28: 15 mL via OROMUCOSAL
  Filled 2022-06-28: qty 15

## 2022-06-28 MED ORDER — FENTANYL CITRATE (PF) 100 MCG/2ML IJ SOLN
INTRAMUSCULAR | Status: AC
  Filled 2022-06-28: qty 2

## 2022-06-28 MED ORDER — LACTATED RINGERS IV SOLN: INTRAVENOUS | Status: DC

## 2022-06-28 MED ORDER — PHENYLEPHRINE 80 MCG/ML (10ML) SYRINGE FOR IV PUSH (FOR BLOOD PRESSURE SUPPORT)
PREFILLED_SYRINGE | INTRAVENOUS | Status: DC | PRN
  Administered 2022-06-28 (×4): 80 ug via INTRAVENOUS

## 2022-06-28 MED ORDER — FENTANYL CITRATE (PF) 250 MCG/5ML IJ SOLN
INTRAMUSCULAR | Status: AC
  Filled 2022-06-28: qty 5

## 2022-06-28 MED ORDER — CELECOXIB 200 MG PO CAPS
200.0000 mg | ORAL_CAPSULE | ORAL | Status: AC
  Administered 2022-06-28: 200 mg via ORAL
  Filled 2022-06-28: qty 1

## 2022-06-28 MED ORDER — MIDAZOLAM HCL 2 MG/2ML IJ SOLN
INTRAMUSCULAR | Status: AC
  Filled 2022-06-28: qty 2

## 2022-06-28 MED ORDER — OXYCODONE HCL 5 MG PO TABS: 5.0000 mg | ORAL_TABLET | Freq: Four times a day (QID) | ORAL | 0 refills | Status: DC | PRN

## 2022-06-28 MED ORDER — FENTANYL CITRATE (PF) 100 MCG/2ML IJ SOLN
25.0000 ug | INTRAMUSCULAR | Status: DC | PRN
  Administered 2022-06-28: 25 ug via INTRAVENOUS

## 2022-06-28 MED ORDER — DEXAMETHASONE SODIUM PHOSPHATE 10 MG/ML IJ SOLN
INTRAMUSCULAR | Status: DC | PRN
  Administered 2022-06-28: 5 mg via INTRAVENOUS

## 2022-06-28 MED ORDER — EPHEDRINE 5 MG/ML INJ
INTRAVENOUS | Status: AC
  Filled 2022-06-28: qty 10

## 2022-06-28 MED ORDER — ONDANSETRON HCL 4 MG/2ML IJ SOLN
INTRAMUSCULAR | Status: AC
  Filled 2022-06-28: qty 4

## 2022-06-28 MED ORDER — BUPIVACAINE-EPINEPHRINE 0.25% -1:200000 IJ SOLN
INTRAMUSCULAR | Status: DC | PRN
  Administered 2022-06-28: 20 mL

## 2022-06-28 SURGICAL SUPPLY — 43 items
ADH SKN CLS APL DERMABOND .7 (GAUZE/BANDAGES/DRESSINGS) ×1
APL PRP STRL LF DISP 70% ISPRP (MISCELLANEOUS) ×1
APPLIER CLIP 9.375 MED OPEN (MISCELLANEOUS)
APR CLP MED 9.3 20 MLT OPN (MISCELLANEOUS)
BAG COUNTER SPONGE SURGICOUNT (BAG) ×2 IMPLANT
BAG SPNG CNTER NS LX DISP (BAG) ×1
BINDER BREAST LRG (GAUZE/BANDAGES/DRESSINGS) IMPLANT
BINDER BREAST XLRG (GAUZE/BANDAGES/DRESSINGS) IMPLANT
CANISTER SUCT 3000ML PPV (MISCELLANEOUS) IMPLANT
CHLORAPREP W/TINT 26 (MISCELLANEOUS) ×2 IMPLANT
CLIP APPLIE 9.375 MED OPEN (MISCELLANEOUS) IMPLANT
COVER PROBE W GEL 5X96 (DRAPES) ×2 IMPLANT
COVER SURGICAL LIGHT HANDLE (MISCELLANEOUS) ×2 IMPLANT
DERMABOND ADVANCED (GAUZE/BANDAGES/DRESSINGS) ×1
DERMABOND ADVANCED .7 DNX12 (GAUZE/BANDAGES/DRESSINGS) ×2 IMPLANT
DEVICE DUBIN SPECIMEN MAMMOGRA (MISCELLANEOUS) ×2 IMPLANT
DRAPE CHEST BREAST 15X10 FENES (DRAPES) ×2 IMPLANT
ELECT CAUTERY BLADE 6.4 (BLADE) ×2 IMPLANT
ELECT REM PT RETURN 9FT ADLT (ELECTROSURGICAL) ×1
ELECTRODE REM PT RTRN 9FT ADLT (ELECTROSURGICAL) ×2 IMPLANT
GLOVE BIO SURGEON STRL SZ8 (GLOVE) ×2 IMPLANT
GLOVE BIOGEL PI IND STRL 8 (GLOVE) ×2 IMPLANT
GLOVE BIOGEL PI INDICATOR 8 (GLOVE) ×1
GOWN STRL REUS W/ TWL LRG LVL3 (GOWN DISPOSABLE) ×2 IMPLANT
GOWN STRL REUS W/ TWL XL LVL3 (GOWN DISPOSABLE) ×2 IMPLANT
GOWN STRL REUS W/TWL LRG LVL3 (GOWN DISPOSABLE) ×1
GOWN STRL REUS W/TWL XL LVL3 (GOWN DISPOSABLE) ×1
KIT BASIN OR (CUSTOM PROCEDURE TRAY) ×2 IMPLANT
KIT MARKER MARGIN INK (KITS) IMPLANT
LIGHT WAVEGUIDE WIDE FLAT (MISCELLANEOUS) IMPLANT
NDL HYPO 25GX1X1/2 BEV (NEEDLE) IMPLANT
NEEDLE HYPO 25GX1X1/2 BEV (NEEDLE) IMPLANT
NS IRRIG 1000ML POUR BTL (IV SOLUTION) IMPLANT
PACK GENERAL/GYN (CUSTOM PROCEDURE TRAY) ×2 IMPLANT
SUT MNCRL AB 4-0 PS2 18 (SUTURE) ×2 IMPLANT
SUT SILK 2 0 SH (SUTURE) IMPLANT
SUT VIC AB 2-0 SH 27 (SUTURE)
SUT VIC AB 2-0 SH 27XBRD (SUTURE) IMPLANT
SUT VIC AB 3-0 SH 27 (SUTURE)
SUT VIC AB 3-0 SH 27X BRD (SUTURE) IMPLANT
SUT VIC AB 3-0 SH 8-18 (SUTURE) ×2 IMPLANT
SYR CONTROL 10ML LL (SYRINGE) IMPLANT
TOWEL GREEN STERILE FF (TOWEL DISPOSABLE) IMPLANT

## 2022-06-28 NOTE — Anesthesia Procedure Notes (Signed)
Procedure Name: LMA Insertion Date/Time: 06/28/2022 9:45 AM  Performed by: Dorann Lodge, CRNAPre-anesthesia Checklist: Patient identified, Emergency Drugs available, Suction available and Patient being monitored Patient Re-evaluated:Patient Re-evaluated prior to induction Oxygen Delivery Method: Circle System Utilized Preoxygenation: Pre-oxygenation with 100% oxygen Induction Type: IV induction Ventilation: Mask ventilation without difficulty LMA: LMA inserted LMA Size: 4.0 Number of attempts: 1 Airway Equipment and Method: Bite block Placement Confirmation: positive ETCO2 Tube secured with: Tape Dental Injury: Teeth and Oropharynx as per pre-operative assessment

## 2022-06-28 NOTE — Op Note (Signed)
Preoperative diagnosis: Right breast radial scar upper outer quadrant  Postoperative diagnosis: Same  Procedure: Right breast seed localized lumpectomy  Surgeon: Erroll Luna, MD  Anesthesia: LMA with 0.25% Marcaine with epinephrine  EBL: Minimal  Specimen: Right breast tissue with seed and clip verified by Faxitron  Drains: None  IV fluids: Per anesthesia record  Indications for procedure: The patient is a 61 year old female with an abnormal right breast mammogram.  Core biopsy was performed on an area of density in the upper outer quadrant of her right breast which showed radial scar.  She was seen in consultation we discussed options of excision versus observation.  We discussed potential upgrade risk of these lesions being anywhere up to 20%.  We discussed long-term follow-up as well.  She opted for right breast seed localized lumpectomy.The procedure has been discussed with the patient. Alternatives to surgery have been discussed with the patient.  Risks of surgery include bleeding,  Infection,  Seroma formation, death,  and the need for further surgery.   The patient understands and wishes to proceed.    Description of procedure: The patient was met in the holding area and questions were answered.  Of note her seed was placed as an outpatient.  The right side was marked as the correct site.  Films were available for review.  She was taken back to the operative room.  She was placed upon the OR table in the supine position and padded appropriately.  After induction of general esthesia the right breast was prepped and draped in sterile fashion timeout performed.  Neoprobe was used to localize the seed in the upper outer quadrant of the right breast.  Local anesthetic was infiltrated in the area over the seed.  Incision was made.  Dissection was carried down all tissue around the seed and clip were excised with a grossly negative margin.  Hemostasis achieved.  Cautery used for hemostasis.   Wound was infiltrated with local anesthetic.  Was then closed with a deep layer 3-0 Vicryl and 4 Monocryl was used to close the skin in a subcuticular fashion.  Dermabond applied.  Breast binder placed.  All counts found to be correct.  The patient was then awoke extubated taken to recovery in satisfactory condition.

## 2022-06-28 NOTE — Interval H&P Note (Signed)
History and Physical Interval Note:  06/28/2022 9:02 AM  Alexandria [name]  has presented today for surgery, with the diagnosis of RADIAL SCAR.  The various methods of treatment have been discussed with the patient and family. After consideration of risks, benefits and other options for treatment, the patient has consented to  Procedure(s): RIGHT BREAST LUMPECTOMY WITH RADIOACTIVE SEED LOCALIZATION (Right) as a surgical intervention.  The patient's history has been reviewed, patient examined, no change in status, stable for surgery.  I have reviewed the patient's chart and labs.  Questions were answered to the patient's satisfaction.     [place]

## 2022-06-28 NOTE — H&P (Signed)
Chief Complaint: New Consultation (R complex sclerosing lesion)   History of Present Illness: Alexandria [name] is a 61 y.o. female who is seen today as an office consultation for evaluation of New Consultation (R complex sclerosing lesion) .   Patient presents for evaluation of the right breast mammographic abnormality. This is noted on recent mammogram. Core biopsy was done which showed a radial scar. No family history of breast cancer. No history of breast pain, nipple discharge or mass.  Review of Systems: A complete review of systems was obtained from the patient. I have reviewed this information and discussed as appropriate with the patient. See HPI as well for other ROS.  Review of Systems  Constitutional: Negative.  HENT: Negative.  Eyes: Negative.  Respiratory: Negative.  Cardiovascular: Negative.  Gastrointestinal: Negative.  Genitourinary: Negative.  Musculoskeletal: Negative.  Skin: Negative.  Neurological: Negative.  Endo/Heme/Allergies: Negative.  Psychiatric/Behavioral: Negative.    Medical History: Past Medical History:  Diagnosis Date   Arrhythmia   GERD (gastroesophageal reflux disease)   History of stroke   Hypertension   There is no problem list on file for this patient.  Past Surgical History:  Procedure Laterality Date   CARDIAC FOCAL ABLATION UTILIZING RADIATION THERAPY    No Known Allergies  Current Outpatient Medications on File Prior to Visit  Medication Sig Dispense Refill   ALPRAZolam (XANAX) 0.5 MG tablet alprazolam 0.5 mg tablet TAKE 1/2-1 TABLET BY MOUTH TWICE A DAY AS NEEDED   apixaban (ELIQUIS) 5 mg tablet Eliquis 5 mg tablet   atorvastatin (LIPITOR) 40 MG tablet atorvastatin 40 mg tablet   dilTIAZem (CARDIZEM CD) 240 MG CD capsule Take 240 mg by mouth once daily   dilTIAZem (CARDIZEM) 30 MG tablet diltiazem 30 mg tablet   famotidine (PEPCID) 40 MG tablet Take 40 mg by mouth at bedtime   flecainide (TAMBOCOR) 100 MG tablet    levothyroxine (SYNTHROID) 112 MCG tablet TAKE 1 TABLET ONCE A DAY FOR 30 DAYS 90   pantoprazole (PROTONIX) 40 MG DR tablet Take 40 mg by mouth 2 (two) times daily   No current facility-administered medications on file prior to visit.   Family History  Problem Relation Age of Onset   Hyperlipidemia (Elevated cholesterol) Mother    Social History   Tobacco Use  Smoking Status Never  Smokeless Tobacco Never    Social History   Socioeconomic History   Marital status: Unknown  Tobacco Use   Smoking status: Never   Smokeless tobacco: Never  Vaping Use   Vaping Use: Never used  Substance and Sexual Activity   Alcohol use: Yes   Drug use: Never   Objective:   Vitals:  02/20/22 1523  BP: 122/78  Pulse: 61  SpO2: 97%  Weight: 80.6 kg (177 lb 9.6 oz)  Height: 170.2 cm ('5\' 7"'$ )   Body mass index is 27.82 kg/m.  Physical Exam Constitutional:  Appearance: Normal appearance.  HENT:  Head: Normocephalic.  Eyes:  General: No scleral icterus. Pupils: Pupils are equal, round, and reactive to light.  Cardiovascular:  Rate and Rhythm: Normal rate. Rhythm irregular.  Pulmonary:  Effort: Pulmonary effort is normal.  Breath sounds: Normal breath sounds.  Chest:  Breasts: Right: Normal.  Left: Normal.  Comments: Large pendulous breasts. Musculoskeletal:  General: Normal range of motion.  Cervical back: Normal range of motion.  Lymphadenopathy:  Upper Body:  Right upper body: No supraclavicular or axillary adenopathy.  Left upper body: No supraclavicular or axillary adenopathy.  Skin: General: Skin is  warm and dry.  Neurological:  General: No focal deficit present.  Mental Status: She is alert and oriented to person, place, and time.     Labs, Imaging and Diagnostic Testing: 61 year old female recalled from screening mammogram dated 11/01/2021 for possible right breast asymmetry and mass and left breast calcifications.   EXAM: DIGITAL DIAGNOSTIC BILATERAL  MAMMOGRAM WITH TOMOSYNTHESIS AND CAD; ULTRASOUND RIGHT BREAST LIMITED   TECHNIQUE: Bilateral digital diagnostic mammography and breast tomosynthesis was performed. The images were evaluated with computer-aided detection.; Targeted ultrasound examination of the right breast was performed   COMPARISON: Previous exam(s).   ACR Breast Density Category c: The breast tissue is heterogeneously dense, which may obscure small masses.   FINDINGS: There is a persistent oval, circumscribed equal density mass in the superior central right breast at mid depth. Additionally, persistent distortion with associated calcifications is demonstrated in the upper right breast at posterior depth is seen on the mL and MLO projections.   Within the left breast, there is a 1 cm group of amorphous calcifications in the lower outer quadrant at anterior depth.   Targeted ultrasound is performed, showing an irregular hypoechoic mass at the 11 o'clock position 6 cm from the nipple. It measures 5 x 3 x 4 mm. There is no internal vascularity. This likely corresponds with the mammographic mass. No definite ultrasound correlate is demonstrated for the distortion in the upper right breast. Evaluation of the right axilla demonstrates no suspicious lymphadenopathy.   IMPRESSION: 1. Suspicious right breast distortion and associated calcifications without ultrasound correlate. Recommendation is for stereotactic biopsy. 2. Indeterminate right breast mass at the 11 o'clock position 6 cm from the nipple. Recommendation is for ultrasound-guided biopsy with close attention on post clip films to demonstrate mammographic correlation. 3. No suspicious right axillary lymphadenopathy. 4. Indeterminate amorphous calcifications in the lower outer left breast. Recommendation is for stereotactic biopsy   RECOMMENDATION: Three area biopsy to include ultrasound-guided biopsy of the right breast and stereotactic biopsy of the  right and left breast.   I have discussed the findings and recommendations with the patient. If applicable, a reminder letter will be sent to the patient regarding the next appointment.   BI-RADS CATEGORY 4: Suspicious.     Electronically Signed By: Kristopher Oppenheim M.D. On: 01/09/2022 13:10    Assessment and Plan:   Diagnoses and all orders for this visit:  Radial scar of breast - Ambulatory Referral to Plastic Surgery    Discussed the pros and cons of seed localized lumpectomy. She does have an interest in breast reduction with lift and we will make referral to a plastic surgeon to discuss this option that could be done at the same time. The procedure has been discussed with the patient. Alternatives to surgery have been discussed with the patient. Risks of surgery include bleeding, Infection, Seroma formation, death, and the need for further surgery. The patient understands and wishes to proceed. Discussed seed localized lumpectomy. Risks and benefits of surgery discussed. Complications of surgery discussed. All turned into surgery discussed. She wishes to proceed with right breast seed localized lumpectomy with the potential for concomitant breast reduction/lift. No follow-ups on file.  Kennieth Francois, MD

## 2022-06-28 NOTE — Transfer of Care (Signed)
Immediate Anesthesia Transfer of Care Note  Patient: Alexandria [name]  Procedure(s) Performed: RIGHT BREAST LUMPECTOMY WITH RADIOACTIVE SEED LOCALIZATION (Right: Breast)  Patient Location: PACU  Anesthesia Type:General  Level of Consciousness: awake and drowsy  Airway & Oxygen Therapy: Patient Spontanous Breathing  Post-op Assessment: Report given to RN and Post -op Vital signs reviewed and stable  Post vital signs: Reviewed and stable  Last Vitals:  Vitals Value Taken Time  BP 108/57 06/28/22 1041  Temp    Pulse 75 06/28/22 1043  Resp 14 06/28/22 1043  SpO2 94 % 06/28/22 1043  Vitals shown include unvalidated device data.  Last Pain:  Vitals:   06/28/22 0832  TempSrc:   PainSc: 0-No pain         Complications: No notable events documented.

## 2022-06-28 NOTE — Discharge Instructions (Signed)
[place] Office Phone Number [phone]  BREAST BIOPSY/ PARTIAL MASTECTOMY: POST OP INSTRUCTIONS  Always review your discharge instruction sheet given to you by the facility where your surgery was performed.  IF YOU HAVE DISABILITY OR FAMILY LEAVE FORMS, YOU MUST BRING THEM TO THE OFFICE FOR PROCESSING.  DO NOT GIVE THEM TO YOUR DOCTOR.  A prescription for pain medication may be given to you upon discharge.  Take your pain medication as prescribed, if needed.  If narcotic pain medicine is not needed, then you may take acetaminophen (Tylenol) or ibuprofen (Advil) as needed. Take your usually prescribed medications unless otherwise directed If you need a refill on your pain medication, please contact your pharmacy.  They will contact our office to request authorization.  Prescriptions will not be filled after 5pm or on week-ends. You should eat very light the first 24 hours after surgery, such as soup, crackers, pudding, etc.  Resume your normal diet the day after surgery. Most patients will experience some swelling and bruising in the breast.  Ice packs and a good support bra will help.  Swelling and bruising can take several days to resolve.  It is common to experience some constipation if taking pain medication after surgery.  Increasing fluid intake and taking a stool softener will usually help or prevent this problem from occurring.  A mild laxative (Milk of Magnesia or Miralax) should be taken according to package directions if there are no bowel movements after 48 hours. Unless discharge instructions indicate otherwise, you may remove your bandages 24-48 hours after surgery, and you may shower at that time.  You may have steri-strips (small skin tapes) in place directly over the incision.  These strips should be left on the skin for 7-10 days.  If your surgeon used skin glue on the incision, you may shower in 24 hours.  The glue will flake off over the next 2-3 weeks.  Any  sutures or staples will be removed at the office during your follow-up visit. ACTIVITIES:  You may resume regular daily activities (gradually increasing) beginning the next day.  Wearing a good support bra or sports bra minimizes pain and swelling.  You may have sexual intercourse when it is comfortable. You may drive when you no longer are taking prescription pain medication, you can comfortably wear a seatbelt, and you can safely maneuver your car and apply brakes. RETURN TO WORK:  ______________________________________________________________________________________ Dennis Bast should see your doctor in the office for a follow-up appointment approximately two weeks after your surgery.  Your doctor's nurse will typically make your follow-up appointment when she calls you with your pathology report.  Expect your pathology report 2-3 business days after your surgery.  You may call to check if you do not hear from [place] after three days. OTHER INSTRUCTIONS: _______________________________________________________________________________________________ _____________________________________________________________________________________________________________________________________ _____________________________________________________________________________________________________________________________________ _____________________________________________________________________________________________________________________________________  WHEN TO CALL YOUR DOCTOR: Fever over 101.0 Nausea and/or vomiting. Extreme swelling or bruising. Continued bleeding from incision. Increased pain, redness, or drainage from the incision.  The clinic staff is available to answer your questions during regular business hours.  Please don't hesitate to call and ask to speak to one of the nurses for clinical concerns.  If you have a medical emergency, go to the nearest emergency room or call 911.  A surgeon from [facility] is always on call at the hospital.  For further questions, please visit [email or web address]          RESTART BLOOD THINNERS IN 48 HOURS AT REGULAR DOSE

## 2022-06-29 ENCOUNTER — Encounter: Payer: Self-pay | Admitting: Surgery

## 2022-06-29 ENCOUNTER — Encounter (HOSPITAL_COMMUNITY): Payer: Self-pay | Admitting: Surgery

## 2022-06-29 LAB — SURGICAL PATHOLOGY

## 2022-06-30 NOTE — Anesthesia Postprocedure Evaluation (Signed)
Anesthesia Post Note  Patient: Alexandria [name]  Procedure(s) Performed: RIGHT BREAST LUMPECTOMY WITH RADIOACTIVE SEED LOCALIZATION (Right: Breast)     Patient location during evaluation: PACU Anesthesia Type: General Level of consciousness: awake and alert Pain management: pain level controlled Vital Signs Assessment: post-procedure vital signs reviewed and stable Respiratory status: spontaneous breathing, nonlabored ventilation, respiratory function stable and patient connected to nasal cannula oxygen Cardiovascular status: blood pressure returned to baseline and stable Postop Assessment: no apparent nausea or vomiting Anesthetic complications: no   No notable events documented.  Last Vitals:  Vitals:   06/28/22 1130 06/28/22 1145  BP: 121/66 124/61  Pulse: (!) 56 (!) 54  Resp: 14 12  Temp:  36.5 C  SpO2: 93% 94%    Last Pain:  Vitals:   06/28/22 1130  TempSrc:   PainSc: Asleep                 Tiajuana Amass

## 2022-07-12 ENCOUNTER — Encounter (HOSPITAL_COMMUNITY): Payer: Self-pay

## 2022-07-28 ENCOUNTER — Other Ambulatory Visit (HOSPITAL_BASED_OUTPATIENT_CLINIC_OR_DEPARTMENT_OTHER): Payer: Self-pay

## 2022-07-28 ENCOUNTER — Encounter: Payer: Self-pay | Admitting: Family Medicine

## 2022-07-28 ENCOUNTER — Encounter: Payer: Self-pay | Admitting: Family

## 2022-07-28 DIAGNOSIS — E663 Overweight: Secondary | ICD-10-CM

## 2022-07-28 MED ORDER — WEGOVY 0.25 MG/0.5ML ~~LOC~~ SOAJ
0.2500 mg | SUBCUTANEOUS | 0 refills | Status: DC
  Filled 2022-07-28: qty 2, 28d supply, fill #0

## 2022-08-06 ENCOUNTER — Other Ambulatory Visit: Payer: Self-pay | Admitting: Internal Medicine

## 2022-08-11 ENCOUNTER — Ambulatory Visit: Payer: Self-pay | Admitting: General Surgery

## 2022-08-11 NOTE — H&P (Signed)
Chief Complaint: Hernia       History of Present Illness: Zenya Hickam is a 61 y.o. female who is seen today as an office consultation at the request of Dr. Etter Sjogren for evaluation of Hernia .   Patient is a 61 year old female who comes in secondary to hiatal hernia.  She has a history of A-fib, on Eliquis.  Sees Dr. Rayann Heman.  Patient states that she is currently not in A-fib and is taking Cardizem and flecainide.  She has a history of hypothyroidism.   Patient states that she has had significant issues with reflux over the last several years.  She previously was told she had a hiatal hernia in 2019 after cardiac CT scan.  She states that she has had increasing issues with reflux at nighttime.  She has a bed that she keeps elevated.  She eats early in the evening to prevent any reflux she is currently on Pepcid as well.  She states even with these interventions she still has significant reflux at nighttime.  She has burning throat, and regurgitation.   Patient had a recent lumpectomy.  She had no previous abdominal surgery.   I did review the patient's CT scan.  She has a small approximate 3 cm hiatal hernia at the hiatus with a moderate slider.           Review of Systems: A complete review of systems was obtained from the patient.  I have reviewed this information and discussed as appropriate with the patient.  See HPI as well for other ROS.   Review of Systems  Constitutional: Negative.   HENT: Negative.    Eyes: Negative.   Respiratory: Negative.    Cardiovascular: Negative.   Gastrointestinal:  Positive for abdominal pain and heartburn.  Genitourinary: Negative.   Musculoskeletal: Negative.   Skin: Negative.   Neurological: Negative.   Endo/Heme/Allergies: Negative.   Psychiatric/Behavioral: Negative.         Medical History: Past Medical History Past Medical History: Diagnosis Date  Arrhythmia    GERD (gastroesophageal reflux disease)    History of stroke     Hypertension        There is no problem list on file for this patient.     Past Surgical History Past Surgical History: Procedure Laterality Date  CARDIAC FOCAL ABLATION UTILIZING RADIATION THERAPY       MASTECTOMY PARTIAL / LUMPECTOMY          Allergies No Known Allergies    Current Outpatient Medications on File Prior to Visit Medication Sig Dispense Refill  semaglutide (WEGOVY) 0.25 mg/0.5 mL pen injector Inject 0.25 mg subcutaneously once a week      ALPRAZolam (XANAX) 0.5 MG tablet alprazolam 0.5 mg tablet  TAKE 1/2-1 TABLET BY MOUTH TWICE A DAY AS NEEDED      apixaban (ELIQUIS) 5 mg tablet Eliquis 5 mg tablet      atorvastatin (LIPITOR) 40 MG tablet atorvastatin 40 mg tablet      dilTIAZem (CARDIZEM CD) 240 MG CD capsule Take 240 mg by mouth once daily      dilTIAZem (CARDIZEM) 30 MG tablet diltiazem 30 mg tablet      famotidine (PEPCID) 40 MG tablet Take 40 mg by mouth at bedtime      flecainide (TAMBOCOR) 100 MG tablet        levothyroxine (SYNTHROID) 112 MCG tablet TAKE 1 TABLET ONCE A DAY FOR 30 DAYS 90      pantoprazole (PROTONIX) 40 MG DR tablet  Take 40 mg by mouth 2 (two) times daily       No current facility-administered medications on file prior to visit.     Family History Family History Problem Relation Age of Onset  Hyperlipidemia (Elevated cholesterol) Mother        Social History   Tobacco Use Smoking Status Never Smokeless Tobacco Never     Social History Social History    Socioeconomic History  Marital status: Married Tobacco Use  Smoking status: Never  Smokeless tobacco: Never Vaping Use  Vaping Use: Never used Substance and Sexual Activity  Alcohol use: Yes  Drug use: Never      Objective:     There were no vitals filed for this visit.  There is no height or weight on file to calculate BMI.   Physical Exam Constitutional:      Appearance: Normal appearance.  HENT:     Head: Normocephalic and atraumatic.      Mouth/Throat:     Mouth: Mucous membranes are moist.     Pharynx: Oropharynx is clear.  Eyes:     General: No scleral icterus.    Pupils: Pupils are equal, round, and reactive to light.  Cardiovascular:     Rate and Rhythm: Normal rate and regular rhythm.     Pulses: Normal pulses.     Heart sounds: No murmur heard.   No friction rub. No gallop.  Pulmonary:     Effort: Pulmonary effort is normal. No respiratory distress.     Breath sounds: Normal breath sounds. No stridor.  Abdominal:     General: Abdomen is flat.  Musculoskeletal:        General: No swelling.  Skin:    General: Skin is warm.  Neurological:     General: No focal deficit present.     Mental Status: She is alert and oriented to person, place, and time. Mental status is at baseline.  Psychiatric:        Mood and Affect: Mood normal.        Thought Content: Thought content normal.        Judgment: Judgment normal.          Assessment and Plan: Diagnoses and all orders for this visit:   Hiatal hernia     Myracle Febres is a 61 y.o. female    1.  We will proceed to the OR for a robotic hiatal hernia repair with mesh and fundoplication 2. All risks and benefits were discussed with the patient, to generally include infection, bleeding, damage to surrounding structures, possible pneumothorax, and recurrence. Alternatives were offered and described.  All questions were answered and the patient voiced understanding of the procedure and wishes to proceed at this point.

## 2022-08-20 ENCOUNTER — Other Ambulatory Visit: Payer: Self-pay | Admitting: Gastroenterology

## 2022-08-21 ENCOUNTER — Other Ambulatory Visit: Payer: Self-pay | Admitting: Gastroenterology

## 2022-08-25 ENCOUNTER — Inpatient Hospital Stay (HOSPITAL_BASED_OUTPATIENT_CLINIC_OR_DEPARTMENT_OTHER): Payer: BC Managed Care – PPO | Admitting: Family

## 2022-08-25 ENCOUNTER — Inpatient Hospital Stay: Payer: BC Managed Care – PPO | Attending: Hematology & Oncology

## 2022-08-25 ENCOUNTER — Encounter: Payer: Self-pay | Admitting: Family

## 2022-08-25 VITALS — BP 131/69 | HR 62 | Temp 99.1°F | Resp 17 | Wt 180.8 lb

## 2022-08-25 DIAGNOSIS — D509 Iron deficiency anemia, unspecified: Secondary | ICD-10-CM | POA: Diagnosis present

## 2022-08-25 LAB — CBC WITH DIFFERENTIAL (CANCER CENTER ONLY)
Abs Immature Granulocytes: 0.1 10*3/uL — ABNORMAL HIGH (ref 0.00–0.07)
Basophils Absolute: 0 10*3/uL (ref 0.0–0.1)
Basophils Relative: 1 %
Eosinophils Absolute: 0.1 10*3/uL (ref 0.0–0.5)
Eosinophils Relative: 2 %
HCT: 38.6 % (ref 36.0–46.0)
Hemoglobin: 12.3 g/dL (ref 12.0–15.0)
Immature Granulocytes: 2 %
Lymphocytes Relative: 16 %
Lymphs Abs: 1 10*3/uL (ref 0.7–4.0)
MCH: 27.6 pg (ref 26.0–34.0)
MCHC: 31.9 g/dL (ref 30.0–36.0)
MCV: 86.7 fL (ref 80.0–100.0)
Monocytes Absolute: 0.4 10*3/uL (ref 0.1–1.0)
Monocytes Relative: 6 %
Neutro Abs: 4.7 10*3/uL (ref 1.7–7.7)
Neutrophils Relative %: 73 %
Platelet Count: 292 10*3/uL (ref 150–400)
RBC: 4.45 MIL/uL (ref 3.87–5.11)
RDW: 14.6 % (ref 11.5–15.5)
WBC Count: 6.4 10*3/uL (ref 4.0–10.5)
nRBC: 0 % (ref 0.0–0.2)

## 2022-08-25 LAB — FERRITIN: Ferritin: 9 ng/mL — ABNORMAL LOW (ref 11–307)

## 2022-08-25 LAB — RETICULOCYTES
Immature Retic Fract: 13.6 % (ref 2.3–15.9)
RBC.: 4.54 MIL/uL (ref 3.87–5.11)
Retic Count, Absolute: 47.2 10*3/uL (ref 19.0–186.0)
Retic Ct Pct: 1 % (ref 0.4–3.1)

## 2022-08-25 NOTE — Progress Notes (Signed)
Hematology and Oncology Follow Up Visit  Alexandria [name] [id number] 12-01-1960 61 y.o. 08/25/2022   Principle Diagnosis:  Iron deficiency anemia   Current Therapy:        IV iron as indicated    Interim History:  Alexandria [name] is here today for follow-up. She is doing well and has recuperated nicely from her lumpectomy in August.  She has not noted any obvious blood loss. No bruising or petechiae.  She decided to hold off on having the capsule endoscopy with GI until a later time. She is scheduled to have hiatal hernia repair on 10/10/2022. No fever, chills, n/v, cough, rash, dizziness, SOB, chest pain, palpitations, abdominal pain or changes in bowel or bladder habits at this time.  No swelling, tenderness, numbness or tingling in her extremities.  No falls or syncope.  Appetite and hydration are good. Her weight is 180 lbs. She had to stop [facility] for now due to a [place] wide shortage.   ECOG Performance Status: 1 - Symptomatic but completely ambulatory  Medications:  Allergies as of 08/25/2022       Reactions   Peanut-containing Drug Products Swelling        Medication List        Accurate as of August 25, 2022  2:23 PM. If you have any questions, ask your nurse or doctor.          acetaminophen 500 MG tablet Commonly known as: TYLENOL Take 500 mg by mouth 2 (two) times daily as needed for moderate pain or headache.   ALPRAZolam 0.5 MG tablet Commonly known as: XANAX TAKE 1/2 TO 1 TABLET BY MOUTH TWICE A DAY AS NEEDED What changed:  how much to take how to take this when to take this reasons to take this additional instructions   atorvastatin 40 MG tablet Commonly known as: LIPITOR TAKE 1 TABLET BY MOUTH EVERY DAY IN THE EVENING   diltiazem 240 MG 24 hr capsule Commonly known as: CARDIZEM CD TAKE 1 CAPSULE BY MOUTH EVERY DAY   diltiazem 30 MG tablet Commonly known as: CARDIZEM TAKE 1 TABLET (30 MG TOTAL) BY MOUTH DAILY AS NEEDED. FOR  PALPITATIONS/HEART RATE ABOVE 110BPM   Eliquis 5 MG Tabs tablet Generic drug: apixaban TAKE 1 TABLET BY MOUTH TWICE A DAY   famotidine 40 MG tablet Commonly known as: PEPCID TAKE 1 TABLET BY MOUTH EVERYDAY AT BEDTIME   flecainide 100 MG tablet Commonly known as: TAMBOCOR Take 1 tablet (100 mg total) by mouth 2 (two) times daily.   Iron (Ferrous Sulfate) 325 (65 Fe) MG Tabs Take 325 mg by mouth daily.   levothyroxine 137 MCG tablet Commonly known as: SYNTHROID Take 137 mcg by mouth daily before breakfast.   oxyCODONE 5 MG immediate release tablet Commonly known as: Oxy IR/ROXICODONE Take 1 tablet (5 mg total) by mouth every 6 (six) hours as needed for severe pain.   pantoprazole 40 MG tablet Commonly known as: PROTONIX TAKE 1 TABLET BY MOUTH TWICE A DAY   Wegovy 0.25 MG/0.5ML Soaj Generic drug: Semaglutide-Weight Management Inject 0.25 mg into the skin once a week.        Allergies:  Allergies  Allergen Reactions   Peanut-Containing Drug Products Swelling    Past Medical History, Surgical history, Social history, and Family History were reviewed and updated.  Review of Systems: All other 10 point review of systems is negative.   Physical Exam:  vitals were not taken for this visit.   Wt Readings from  Last 3 Encounters:  06/28/22 190 lb (86.2 kg)  06/23/22 180 lb (81.6 kg)  06/21/22 180 lb 9.6 oz (81.9 kg)    Ocular: Sclerae unicteric, pupils equal, round and reactive to light Ear-nose-throat: Oropharynx clear, dentition fair Lymphatic: No cervical or supraclavicular adenopathy Lungs no rales or rhonchi, good excursion bilaterally Heart regular rate and rhythm, no murmur appreciated Abd soft, nontender, positive bowel sounds MSK no focal spinal tenderness, no joint edema Neuro: non-focal, well-oriented, appropriate affect Breasts: Deferred  Lab Results  Component Value Date   WBC 6.4 08/25/2022   HGB 12.3 08/25/2022   HCT 38.6 08/25/2022   MCV  86.7 08/25/2022   PLT 292 08/25/2022   Lab Results  Component Value Date   FERRITIN 31 06/09/2022   IRON 58 06/09/2022   TIBC 329 06/09/2022   UIBC 271 06/09/2022   IRONPCTSAT 18 06/09/2022   Lab Results  Component Value Date   RETICCTPCT 1.0 08/25/2022   RBC 4.45 08/25/2022   RBC 4.54 08/25/2022   No results found for: "KPAFRELGTCHN", "LAMBDASER", "KAPLAMBRATIO" No results found for: "IGGSERUM", "IGA", "IGMSERUM" No results found for: "TOTALPROTELP", "ALBUMINELP", "A1GS", "A2GS", "BETS", "BETA2SER", "GAMS", "MSPIKE", "SPEI"   Chemistry      Component Value Date/Time   NA 140 06/21/2022 1305   NA 141 01/14/2020 1608   K 3.7 06/21/2022 1305   CL 107 06/21/2022 1305   CO2 27 06/21/2022 1305   BUN 17 06/21/2022 1305   BUN 17 01/14/2020 1608   CREATININE 0.95 06/21/2022 1305   CREATININE 0.86 06/09/2022 1137   CREATININE 0.77 09/16/2021 1456      Component Value Date/Time   CALCIUM 8.7 (L) 06/21/2022 1305   ALKPHOS 104 06/09/2022 1137   AST 13 (L) 06/09/2022 1137   ALT 17 06/09/2022 1137   BILITOT 0.4 06/09/2022 1137       Impression and Plan: Alexandria [name] is a very pleasant 61 yo caucasian female with history of iron deficiency anemia since childhood.   Iron studies pending.  Follow-up in 4 months.   Lottie Dawson, NP 10/20/20232:23 PM

## 2022-08-28 ENCOUNTER — Telehealth: Payer: Self-pay | Admitting: *Deleted

## 2022-08-28 LAB — IRON AND IRON BINDING CAPACITY (CC-WL,HP ONLY)
Iron: 52 ug/dL (ref 28–170)
Saturation Ratios: 15 % (ref 10.4–31.8)
TIBC: 353 ug/dL (ref 250–450)
UIBC: 301 ug/dL (ref 148–442)

## 2022-08-28 NOTE — Telephone Encounter (Signed)
Per scheduling message - Judson Roch (3) doses of IV Iron - called patient and gave upcoming appointments - confirmed

## 2022-09-01 ENCOUNTER — Inpatient Hospital Stay: Payer: BC Managed Care – PPO

## 2022-09-01 VITALS — BP 118/60 | HR 55 | Temp 97.0°F | Resp 16

## 2022-09-01 DIAGNOSIS — D509 Iron deficiency anemia, unspecified: Secondary | ICD-10-CM

## 2022-09-01 MED ORDER — SODIUM CHLORIDE 0.9 % IV SOLN: Freq: Once | INTRAVENOUS | Status: AC

## 2022-09-01 MED ORDER — SODIUM CHLORIDE 0.9 % IV SOLN
300.0000 mg | Freq: Once | INTRAVENOUS | Status: AC
  Administered 2022-09-01: 300 mg via INTRAVENOUS
  Filled 2022-09-01: qty 300

## 2022-09-01 NOTE — Patient Instructions (Signed)

## 2022-09-08 ENCOUNTER — Inpatient Hospital Stay: Payer: BC Managed Care – PPO | Attending: Hematology & Oncology

## 2022-09-08 VITALS — BP 122/66 | HR 55 | Temp 97.5°F | Resp 18 | Ht 67.0 in | Wt 180.0 lb

## 2022-09-08 DIAGNOSIS — D509 Iron deficiency anemia, unspecified: Secondary | ICD-10-CM | POA: Insufficient documentation

## 2022-09-08 MED ORDER — SODIUM CHLORIDE 0.9 % IV SOLN: Freq: Once | INTRAVENOUS | Status: AC

## 2022-09-08 MED ORDER — SODIUM CHLORIDE 0.9 % IV SOLN
300.0000 mg | Freq: Once | INTRAVENOUS | Status: AC
  Administered 2022-09-08: 300 mg via INTRAVENOUS
  Filled 2022-09-08: qty 300

## 2022-09-08 NOTE — Patient Instructions (Signed)

## 2022-09-15 ENCOUNTER — Ambulatory Visit: Payer: BC Managed Care – PPO

## 2022-09-18 ENCOUNTER — Inpatient Hospital Stay: Payer: BC Managed Care – PPO

## 2022-09-18 VITALS — BP 112/66 | HR 58 | Temp 98.2°F | Resp 18

## 2022-09-18 DIAGNOSIS — D509 Iron deficiency anemia, unspecified: Secondary | ICD-10-CM | POA: Diagnosis not present

## 2022-09-18 MED ORDER — SODIUM CHLORIDE 0.9 % IV SOLN
300.0000 mg | Freq: Once | INTRAVENOUS | Status: AC
  Administered 2022-09-18: 300 mg via INTRAVENOUS
  Filled 2022-09-18: qty 300

## 2022-09-18 MED ORDER — SODIUM CHLORIDE 0.9 % IV SOLN: Freq: Once | INTRAVENOUS | Status: AC

## 2022-09-18 NOTE — Progress Notes (Signed)
Pt refused to stay for 30 minute post iron infusion.  Pt without complaints at time of discharge.

## 2022-09-18 NOTE — Patient Instructions (Signed)

## 2022-09-28 ENCOUNTER — Other Ambulatory Visit: Payer: Self-pay | Admitting: Internal Medicine

## 2022-09-28 DIAGNOSIS — I480 Paroxysmal atrial fibrillation: Secondary | ICD-10-CM

## 2022-09-29 NOTE — Progress Notes (Signed)
Surgical Instructions    Your procedure is scheduled on Tuesday December 5th.  Report to [facility] Main Entrance "A" at 5:30 A.M., then check in with the Admitting office.  Call this number if you have problems the morning of surgery:  [phone]   If you have any questions prior to your surgery date call [phone]: Open Monday-Friday 8am-4pm If you experience any cold or flu symptoms such as cough, fever, chills, shortness of breath, etc. between now and your scheduled surgery, please notify us at the above number     Remember:  Do not eat after midnight the night before your surgery  You may drink clear liquids until 4:30am the morning of your surgery.   Clear liquids allowed are: Water, Non-Citrus Juices (without pulp), Carbonated Beverages, Clear Tea, Black Coffee ONLY (NO MILK, CREAM OR POWDERED CREAMER of any kind), and Gatorade   Enhanced Recovery after Surgery  Enhanced Recovery after Surgery is a protocol used to improve the stress on your body and your recovery after surgery.  Patient Instructions  The day of surgery (if you do NOT have diabetes):  Drink ONE (1) Pre-Surgery Clear Ensure by _4:30____ am the morning of surgery   This drink was given to you during your hospital  pre-op appointment visit. Nothing else to drink after completing the  Pre-Surgery Clear Ensure.         If you have questions, please contact your surgeon's office.   Take these medicines the morning of surgery with A SIP OF WATER: diltiazem (CARDIZEM CD) 240 MG 24 hr capsule  flecainide (TAMBOCOR) 100 MG tablet  levothyroxine (SYNTHROID) 137 MCG tablet  pantoprazole (PROTONIX) 40 MG tablet    IF NEEDED  acetaminophen (TYLENOL) 500 MG tablet  ALPRAZolam (XANAX) 0.5 MG tablet  diltiazem (CARDIZEM) 30 MG tablet     Follow your surgeon's instructions on when to stop Eliquis.  If no instructions were given by your surgeon then you will need to call the office to get those instructions.     As of today, STOP taking any Aspirin (unless otherwise instructed by your surgeon) Aleve, Naproxen, Ibuprofen, Motrin, Advil, Goody's, BC's, all herbal medications, fish oil, and all vitamins.           Do not wear jewelry or makeup. Do not wear lotions, powders, perfumes or deodorant. Do not shave 48 hours prior to surgery.   Do not bring valuables to the hospital. Do not wear nail polish, gel polish, artificial nails, or any other type of covering on natural nails (fingers and toes) If you have artificial nails or gel coating that need to be removed by a nail salon, please have this removed prior to surgery. Artificial nails or gel coating may interfere with anesthesia's ability to adequately monitor your vital signs.  [facility] is not responsible for any belongings or valuables.    Do NOT Smoke (Tobacco/Vaping)  24 hours prior to your procedure  If you use a CPAP at night, you may bring your mask for your overnight stay.   Contacts, glasses, hearing aids, dentures or partials may not be worn into surgery, please bring cases for these belongings   For patients admitted to the hospital, discharge time will be determined by your treatment team.   Patients discharged the day of surgery will not be allowed to drive home, and someone needs to stay with them for 24 hours.   SURGICAL WAITING ROOM VISITATION Patients having surgery or a procedure may have no more  than 2 support people in the waiting area - these visitors may rotate.   Children under the age of 77 must have an adult with them who is not the patient. If the patient needs to stay at the hospital during part of their recovery, the visitor guidelines for inpatient rooms apply. Pre-op nurse will coordinate an appropriate time for 1 support person to accompany patient in pre-op.  This support person may not rotate.   Please refer to [email or web address] for the visitor guidelines  for Inpatients (after your surgery is over and you are in a regular room).    Special instructions:    Oral Hygiene is also important to reduce your risk of infection.  Remember - BRUSH YOUR TEETH THE MORNING OF SURGERY WITH YOUR REGULAR TOOTHPASTE   [facility]- Preparing For Surgery  Before surgery, you can play an important role. Because skin is not sterile, your skin needs to be as free of germs as possible. You can reduce the number of germs on your skin by washing with CHG (chlorahexidine gluconate) Soap before surgery.  CHG is an antiseptic cleaner which kills germs and bonds with the skin to continue killing germs even after washing.     Please do not use if you have an allergy to CHG or antibacterial soaps. If your skin becomes reddened/irritated stop using the CHG.  Do not shave (including legs and underarms) for at least 48 hours prior to first CHG shower. It is OK to shave your face.  Please follow these instructions carefully.     Shower the NIGHT BEFORE SURGERY and the MORNING OF SURGERY with CHG Soap.   If you chose to wash your hair, wash your hair first as usual with your normal shampoo. After you shampoo, rinse your hair and body thoroughly to remove the shampoo.  Then [employer] and genitals (private parts) with your normal soap and rinse thoroughly to remove soap.  After that Use CHG Soap as you would any other liquid soap. You can apply CHG directly to the skin and wash gently with a scrungie or a clean washcloth.   Apply the CHG Soap to your body ONLY FROM THE NECK DOWN.  Do not use on open wounds or open sores. Avoid contact with your eyes, ears, mouth and genitals (private parts). Wash Face and genitals (private parts)  with your normal soap.   Wash thoroughly, paying special attention to the area where your surgery will be performed.  Thoroughly rinse your body with warm water from the neck down.  DO NOT shower/wash with your normal soap after using and rinsing  off the CHG Soap.  Pat yourself dry with a CLEAN TOWEL.  Wear CLEAN PAJAMAS to bed the night before surgery  Place CLEAN SHEETS on your bed the night before your surgery  DO NOT SLEEP WITH PETS.   Day of Surgery:  Take a shower with CHG soap. Wear Clean/Comfortable clothing the morning of surgery Do not apply any deodorants/lotions.   Remember to brush your teeth WITH YOUR REGULAR TOOTHPASTE.    If you received a COVID test during your pre-op visit, it is requested that you wear a mask when out in public, stay away from anyone that may not be feeling well, and notify your surgeon if you develop symptoms. If you have been in contact with anyone that has tested positive in the last 10 days, please notify your surgeon.    Please read over the following fact sheets that you  were given.

## 2022-10-02 ENCOUNTER — Encounter: Payer: Self-pay | Admitting: Internal Medicine

## 2022-10-02 ENCOUNTER — Encounter (HOSPITAL_COMMUNITY): Payer: Self-pay

## 2022-10-02 ENCOUNTER — Encounter (HOSPITAL_COMMUNITY)
Admission: RE | Admit: 2022-10-02 | Discharge: 2022-10-02 | Disposition: A | Discharge status: Home / Self Care | Payer: BC Managed Care – PPO | Source: Ambulatory Visit | Attending: General Surgery | Admitting: General Surgery

## 2022-10-02 ENCOUNTER — Other Ambulatory Visit: Payer: Self-pay

## 2022-10-02 VITALS — BP 121/67 | HR 63 | Temp 97.5°F | Resp 18 | Ht 67.0 in | Wt 183.9 lb

## 2022-10-02 DIAGNOSIS — Z01818 Encounter for other preprocedural examination: Secondary | ICD-10-CM | POA: Diagnosis present

## 2022-10-02 DIAGNOSIS — I2510 Atherosclerotic heart disease of native coronary artery without angina pectoris: Secondary | ICD-10-CM | POA: Insufficient documentation

## 2022-10-02 HISTORY — DX: Iron deficiency: E61.1

## 2022-10-02 LAB — CBC
HCT: 41.2 % (ref 36.0–46.0)
Hemoglobin: 13 g/dL (ref 12.0–15.0)
MCH: 28.3 pg (ref 26.0–34.0)
MCHC: 31.6 g/dL (ref 30.0–36.0)
MCV: 89.8 fL (ref 80.0–100.0)
Platelets: 295 10*3/uL (ref 150–400)
RBC: 4.59 MIL/uL (ref 3.87–5.11)
RDW: 15.8 % — ABNORMAL HIGH (ref 11.5–15.5)
WBC: 6.7 10*3/uL (ref 4.0–10.5)
nRBC: 0 % (ref 0.0–0.2)

## 2022-10-02 LAB — BASIC METABOLIC PANEL
Anion gap: 11 (ref 5–15)
BUN: 18 mg/dL (ref 8–23)
CO2: 26 mmol/L (ref 22–32)
Calcium: 9.2 mg/dL (ref 8.9–10.3)
Chloride: 106 mmol/L (ref 98–111)
Creatinine, Ser: 0.81 mg/dL (ref 0.44–1.00)
GFR, Estimated: >60 mL/min (ref >60)
Glucose, Bld: 129 mg/dL — ABNORMAL HIGH (ref 70–99)
Potassium: 4.1 mmol/L (ref 3.5–5.1)
Sodium: 143 mmol/L (ref 135–145)

## 2022-10-02 NOTE — Telephone Encounter (Signed)
Eliquis '5mg'$  refill request received. Patient is 61 years old, weight-81.6kg, Crea-0.95 on 06/21/2022, Diagnosis-Afib, and last seen by Dr. Rayann Heman on 06/23/2022. Dose is appropriate based on dosing criteria. Will send in refill to requested pharmacy.

## 2022-10-02 NOTE — Progress Notes (Signed)
PCP - [name], DO  Cardiologist - Dr Rayann Heman  PPM/ICD - denies   Chest x-ray - N/A EKG - 06/13/22 Stress Test - 04/23/18 ECHO - 04/05/18 Cardiac Cath - denies  Sleep Study - 2019- pt reports this was negative   Fasting Blood Sugar - pt denies DM, pt was prescribed Wegovy for weightless, but this is no longer available so she is not taking.   Blood Thinner Instructions:Follow your surgeon's instructions on when to stop Eliquis.  If no instructions were given by your surgeon then you will need to call the office to get those instructions.    Aspirin Instructions: N/A  ERAS Protcol - ERAS + ensure   COVID TEST- N/A   Anesthesia review: previous note from Jeneen Rinks- pt has not gotten cardiac clearance or instructions regarding when to stop Eliquis.   Patient denies shortness of breath, fever, cough and chest pain at PAT appointment   All instructions explained to the patient, with a verbal understanding of the material. Patient agrees to go over the instructions while at home for a better understanding. The opportunity to ask questions was provided.

## 2022-10-03 ENCOUNTER — Telehealth: Payer: Self-pay | Admitting: Internal Medicine

## 2022-10-03 ENCOUNTER — Other Ambulatory Visit (HOSPITAL_COMMUNITY): Payer: BC Managed Care – PPO

## 2022-10-03 NOTE — Anesthesia Preprocedure Evaluation (Addendum)
Anesthesia Evaluation  Patient identified by MRN, date of birth, ID band Patient awake    Reviewed: Allergy & Precautions, NPO status , Patient's Chart, lab work & pertinent test results  Airway Mallampati: III  TM Distance: >3 FB Neck ROM: Full    Dental  (+) Teeth Intact, Dental Advisory Given   Pulmonary neg pulmonary ROS   Pulmonary exam normal breath sounds clear to auscultation       Cardiovascular hypertension, Pt. on medications (-) angina (-) Past MI Normal cardiovascular exam+ dysrhythmias Atrial Fibrillation and Ventricular Tachycardia  Rhythm:Regular Rate:Normal     Neuro/Psych  PSYCHIATRIC DISORDERS Anxiety Depression    TIA Neuromuscular disease CVA, No Residual Symptoms    GI/Hepatic Neg liver ROS, hiatal hernia,GERD  Medicated and Poorly Controlled,,  Endo/Other  Hypothyroidism    Renal/GU negative Renal ROS     Musculoskeletal negative musculoskeletal ROS (+)    Abdominal   Peds  Hematology  (+) Blood dyscrasia (Eliquis)   Anesthesia Other Findings   Reproductive/Obstetrics                             Anesthesia Physical Anesthesia Plan  ASA: 3  Anesthesia Plan: General   Post-op Pain Management: Tylenol PO (pre-op)*   Induction: Intravenous  PONV Risk Score and Plan: 4 or greater and Midazolam, Dexamethasone and Ondansetron  Airway Management Planned: Oral ETT  Additional Equipment: ClearSight  Intra-op Plan:   Post-operative Plan: Extubation in OR  Informed Consent: I have reviewed the patients History and Physical, chart, labs and discussed the procedure including the risks, benefits and alternatives for the proposed anesthesia with the patient or authorized representative who has indicated his/her understanding and acceptance.     Dental advisory given  Plan Discussed with: CRNA  Anesthesia Plan Comments: (PAT note by Karoline Caldwell, PA-C: Follows with  [facility] for history of atrial fibrillation s/p ablation 2019 on Eliquis, TIA, HTN, HLD. Low risk nuclear stress 04/2018. EF 60 to 65% by echo 03/2018. Whenseen by Dr. Rayann Heman 02/17/2022, flecainide was increased to 100 mg twice dailyfor recurrent A-fib. Recommended consider repeat ablation if she continues to have A-fib despite dose escalation. She followed up with Dr. Rayann Heman on 06/23/2022 and wasnoted to be doing well on increased dose of flecainide. Upcoming right breast lumpectomy was also addressed at that time. Per note, "Ok to proceed with breast biopsy/ surgery without further CV testing. Ok to hold eliquis 48-72 hours prior to the surgery and resume as soon as able post operatively."  Patient did subsequently undergo surgery 4/40/1027 without complication.  Per telephone encounter 10/03/2022 in Care Everywhere, patient was instructed by surgeon's office to hold Eliquis 2 days prior to surgery.  Patient previously on GLP-1 agonist Wegovy for weight loss indication, not diabetes.  She states she has discontinued this medication.  Preop labs reviewed, unremarkable.  EKG 06/23/2022: Sinus rhythm.  Rate 65.  Nuclear stress 04/23/2018: ? Nuclear stress EF: 63%. ? The left ventricular ejection fraction is normal (55-65%). ? Blood pressure demonstrated a normal response to exercise. ? Upsloping ST segment depression ST segment depression of 1 mm was noted during stress in the II, III, aVF, V4 and V5 leads, and returning to baseline after less than 1 minute of recovery. ? This is a low risk study.  1. EF 63%, normal wall motion.  2. No evidence for ischemia or infarction on perfusion images.  3. Nonspecific upsloping ST depression in several  leads.   Low risk study based on perfusion images.  TTE 04/05/2018: - Left ventricle: The cavity size was normal. Systolic function was  normal. The estimated ejection fraction was in the range of 60%  to 65%. Wall motion was  normal; there were no regional wall  motion abnormalities. Left ventricular diastolic function  parameters were normal.  - Mitral valve: There was mild regurgitation.  - Left atrium: The atrium was mildly dilated.  - Atrial septum: No defect or patent foramen ovale was identified.  - Impressions: Appears to be some shadowing artifact in LA on  subcostal images.   Impressions:   - Appears to be some shadowing artifact in LA on subcostal images.   )        Anesthesia Quick Evaluation

## 2022-10-03 NOTE — Progress Notes (Signed)
Anesthesia Chart Review:  Follows with [facility] cardiology for history of atrial fibrillation s/p ablation 2019 on Eliquis, TIA, HTN, HLD.  Low risk nuclear stress 04/2018.  EF 60 to 65% by echo 03/2018.  When seen by Dr. Rayann Heman 02/17/2022, flecainide was increased to 100 mg twice daily for recurrent A-fib.  Recommended consider repeat ablation if she continues to have A-fib despite dose escalation.  She followed up with Dr. Rayann Heman on 06/23/2022 and was noted to be doing well on increased dose of flecainide.  Upcoming right breast lumpectomy was also addressed at that time.  Per note, "Ok to proceed with breast biopsy/ surgery without further CV testing.  Ok to hold eliquis 48-72 hours prior to the surgery and resume as soon as able post operatively."  Patient did subsequently undergo surgery 0/73/7106 without complication.  Per telephone encounter 10/03/2022 in Care Everywhere, patient was instructed by surgeon's office to hold Eliquis 2 days prior to surgery.   Patient previously on GLP-1 agonist Wegovy for weight loss indication, not diabetes.  She states she has discontinued this medication.   Preop labs reviewed, unremarkable.   EKG 06/23/2022: Sinus rhythm.  Rate 65.   Nuclear stress 04/23/2018: Nuclear stress EF: 63%. The left ventricular ejection fraction is normal (55-65%). Blood pressure demonstrated a normal response to exercise. Upsloping ST segment depression ST segment depression of 1 mm was noted during stress in the II, III, aVF, V4 and V5 leads, and returning to baseline after less than 1 minute of recovery. This is a low risk study.   1. EF 63%, normal wall motion.  2. No evidence for ischemia or infarction on perfusion images.   3. Nonspecific upsloping ST depression in several leads.     Low risk study based on perfusion images.    TTE 04/05/2018: - Left ventricle: The cavity size was normal. Systolic function was    normal. The estimated ejection fraction was in the range of 60%     to 65%. Wall motion was normal; there were no regional wall    motion abnormalities. Left ventricular diastolic function    parameters were normal.  - Mitral valve: There was mild regurgitation.  - Left atrium: The atrium was mildly dilated.  - Atrial septum: No defect or patent foramen ovale was identified.  - Impressions: Appears to be some shadowing artifact in LA on    subcostal images.   Impressions:   - Appears to be some shadowing artifact in LA on subcostal images.     Wynonia Musty [facility]/Anesthesiology Phone [phone] 10/03/2022 3:27 PM

## 2022-10-03 NOTE — Telephone Encounter (Signed)
   Pre-operative Risk Assessment    Patient Name: Alexandria [name]  DOB: Mar 26, 1961 MRN: [id number]      Request for Surgical Clearance    Procedure:   XI ROBOTIC ASSISTED HIATAL HERNIA REPAIR WITH MESH AND FUNDOPLICATION  Date of Surgery:  Clearance 10/10/22                                 Surgeon:  Dr. Ralene Ok  Surgeon's Group or Practice Name:  [facility]  Phone number:  [phone] Fax number:  [phone]   Type of Clearance Requested:   - Pharmacy:  Hold Apixaban (Eliquis) two days prior   Type of Anesthesia:  General    Additional requests/questions:    Crist Infante   10/03/2022, 2:38 PM

## 2022-10-04 NOTE — Telephone Encounter (Signed)
Patient with diagnosis of afib on Eliquis for anticoagulation.    Procedure:   XI ROBOTIC ASSISTED HIATAL HERNIA REPAIR WITH MESH AND FUNDOPLICATION  Date of procedure: 10/10/22   CHA2DS2-VASc Score = 4   This indicates a 4.8% annual risk of stroke. The patient's score is based upon: CHF History: 0 HTN History: 1 Diabetes History: 0 Stroke History: 2 Vascular Disease History: 0 Age Score: 0 Gender Score: 1      CrCl 78 ml/min  Patient was previously cleared by Dr. Rayann Heman to hold anticoagulation 48-72 hour for a different procedure. She does have a hx of stroke.    Per office protocol, patient can hold Eliquis for 2 days prior to procedure.    **This guidance is not considered finalized until pre-operative APP has relayed final recommendations.**

## 2022-10-04 NOTE — Telephone Encounter (Signed)
   Patient Name: Alexandria [name]  DOB: [birth year] MRN: [id number]  Primary Cardiologist: None  Chart reviewed as part of pre-operative protocol coverage. Pharmacy clearance only. Per office protocols and pharmacist review , IC[facility] BUDREAU may hold Eliquis 2 days prior to the planned procedure.   I will route this recommendation to the requesting party via Epic fax function and remove from pre-op pool.  Please call with questions.  Loel Dubonnet, NP 10/04/2022, 8:51 AM

## 2022-10-10 ENCOUNTER — Other Ambulatory Visit: Payer: Self-pay

## 2022-10-10 ENCOUNTER — Encounter (HOSPITAL_COMMUNITY): Payer: Self-pay | Admitting: General Surgery

## 2022-10-10 ENCOUNTER — Observation Stay (HOSPITAL_COMMUNITY)
Admission: RE | Admit: 2022-10-10 | Discharge: 2022-10-11 | Disposition: A | Discharge status: Home / Self Care | Payer: BC Managed Care – PPO | Attending: General Surgery | Admitting: General Surgery

## 2022-10-10 ENCOUNTER — Ambulatory Visit (HOSPITAL_COMMUNITY): Payer: BC Managed Care – PPO | Admitting: Physician Assistant

## 2022-10-10 ENCOUNTER — Encounter (HOSPITAL_COMMUNITY)
Admission: RE | Disposition: A | Discharge status: Home / Self Care | Payer: Self-pay | Source: Home / Self Care | Attending: General Surgery

## 2022-10-10 DIAGNOSIS — Z79899 Other long term (current) drug therapy: Secondary | ICD-10-CM | POA: Insufficient documentation

## 2022-10-10 DIAGNOSIS — I4891 Unspecified atrial fibrillation: Secondary | ICD-10-CM | POA: Diagnosis not present

## 2022-10-10 DIAGNOSIS — K449 Diaphragmatic hernia without obstruction or gangrene: Secondary | ICD-10-CM | POA: Diagnosis present

## 2022-10-10 DIAGNOSIS — I10 Essential (primary) hypertension: Secondary | ICD-10-CM | POA: Insufficient documentation

## 2022-10-10 DIAGNOSIS — Z7901 Long term (current) use of anticoagulants: Secondary | ICD-10-CM | POA: Diagnosis not present

## 2022-10-10 DIAGNOSIS — Z9889 Other specified postprocedural states: Principal | ICD-10-CM | POA: Diagnosis present

## 2022-10-10 DIAGNOSIS — Z8673 Personal history of transient ischemic attack (TIA), and cerebral infarction without residual deficits: Secondary | ICD-10-CM | POA: Diagnosis not present

## 2022-10-10 HISTORY — PX: INSERTION OF MESH: SHX5868

## 2022-10-10 HISTORY — PX: XI ROBOTIC ASSISTED HIATAL HERNIA REPAIR: SHX6889

## 2022-10-10 LAB — GLUCOSE, CAPILLARY
Glucose-Capillary: 149 mg/dL — ABNORMAL HIGH (ref 70–99)
Glucose-Capillary: 189 mg/dL — ABNORMAL HIGH (ref 70–99)

## 2022-10-10 SURGERY — REPAIR, HERNIA, HIATAL, ROBOT-ASSISTED
Anesthesia: General | Site: Abdomen

## 2022-10-10 MED ORDER — PHENYLEPHRINE HCL-NACL 20-0.9 MG/250ML-% IV SOLN
INTRAVENOUS | Status: DC | PRN
  Administered 2022-10-10: 50 ug/min via INTRAVENOUS

## 2022-10-10 MED ORDER — ONDANSETRON HCL 4 MG/2ML IJ SOLN
INTRAMUSCULAR | Status: AC
  Filled 2022-10-10: qty 2

## 2022-10-10 MED ORDER — FENTANYL CITRATE (PF) 100 MCG/2ML IJ SOLN
INTRAMUSCULAR | Status: AC
  Filled 2022-10-10: qty 2

## 2022-10-10 MED ORDER — CHLORHEXIDINE GLUCONATE 0.12 % MT SOLN
15.0000 mL | Freq: Once | OROMUCOSAL | Status: AC
  Administered 2022-10-10: 15 mL via OROMUCOSAL
  Filled 2022-10-10: qty 15

## 2022-10-10 MED ORDER — PROMETHAZINE HCL 25 MG/ML IJ SOLN: 6.2500 mg | INTRAMUSCULAR | Status: DC | PRN

## 2022-10-10 MED ORDER — EPHEDRINE 5 MG/ML INJ
INTRAVENOUS | Status: AC
  Filled 2022-10-10: qty 5

## 2022-10-10 MED ORDER — GLYCOPYRROLATE 0.2 MG/ML IJ SOLN
INTRAMUSCULAR | Status: DC | PRN
  Administered 2022-10-10 (×2): .2 mg via INTRAVENOUS

## 2022-10-10 MED ORDER — ONDANSETRON HCL 4 MG/2ML IJ SOLN
4.0000 mg | Freq: Four times a day (QID) | INTRAMUSCULAR | Status: DC | PRN
  Filled 2022-10-10: qty 2

## 2022-10-10 MED ORDER — KETOROLAC TROMETHAMINE 30 MG/ML IJ SOLN
30.0000 mg | Freq: Once | INTRAMUSCULAR | Status: AC
  Administered 2022-10-10: 30 mg via INTRAVENOUS

## 2022-10-10 MED ORDER — DEXTROSE-NACL 5-0.9 % IV SOLN: INTRAVENOUS | Status: DC

## 2022-10-10 MED ORDER — ROCURONIUM BROMIDE 10 MG/ML (PF) SYRINGE
PREFILLED_SYRINGE | INTRAVENOUS | Status: DC | PRN
  Administered 2022-10-10: 60 mg via INTRAVENOUS
  Administered 2022-10-10: 20 mg via INTRAVENOUS

## 2022-10-10 MED ORDER — LIDOCAINE 2% (20 MG/ML) 5 ML SYRINGE
INTRAMUSCULAR | Status: AC
  Filled 2022-10-10: qty 10

## 2022-10-10 MED ORDER — BUPIVACAINE LIPOSOME 1.3 % IJ SUSP
INTRAMUSCULAR | Status: AC
  Filled 2022-10-10: qty 40

## 2022-10-10 MED ORDER — ACETAMINOPHEN 500 MG PO TABS
ORAL_TABLET | ORAL | Status: AC
  Administered 2022-10-10: 1000 mg via ORAL
  Filled 2022-10-10: qty 2

## 2022-10-10 MED ORDER — FLECAINIDE ACETATE 100 MG PO TABS
100.0000 mg | ORAL_TABLET | Freq: Two times a day (BID) | ORAL | Status: DC
  Administered 2022-10-10 – 2022-10-11 (×2): 100 mg via ORAL
  Filled 2022-10-10 (×3): qty 1

## 2022-10-10 MED ORDER — PROPOFOL 10 MG/ML IV BOLUS
INTRAVENOUS | Status: DC | PRN
  Administered 2022-10-10: 130 mg via INTRAVENOUS

## 2022-10-10 MED ORDER — CHLORHEXIDINE GLUCONATE CLOTH 2 % EX PADS: 6.0000 | MEDICATED_PAD | Freq: Once | CUTANEOUS | Status: DC

## 2022-10-10 MED ORDER — INSULIN ASPART 100 UNIT/ML IJ SOLN: 0.0000 [IU] | Freq: Three times a day (TID) | INTRAMUSCULAR | Status: DC

## 2022-10-10 MED ORDER — ONDANSETRON HCL 4 MG/2ML IJ SOLN
INTRAMUSCULAR | Status: DC | PRN
  Administered 2022-10-10: 4 mg via INTRAVENOUS

## 2022-10-10 MED ORDER — LACTATED RINGERS IV SOLN: INTRAVENOUS | Status: DC

## 2022-10-10 MED ORDER — SUGAMMADEX SODIUM 200 MG/2ML IV SOLN
INTRAVENOUS | Status: DC | PRN
  Administered 2022-10-10: 200 mg via INTRAVENOUS

## 2022-10-10 MED ORDER — PROPOFOL 1000 MG/100ML IV EMUL
INTRAVENOUS | Status: AC
  Filled 2022-10-10: qty 100

## 2022-10-10 MED ORDER — EPHEDRINE SULFATE-NACL 50-0.9 MG/10ML-% IV SOSY
PREFILLED_SYRINGE | INTRAVENOUS | Status: DC | PRN
  Administered 2022-10-10: 2.5 mg via INTRAVENOUS
  Administered 2022-10-10: 5 mg via INTRAVENOUS

## 2022-10-10 MED ORDER — LACTATED RINGERS IV SOLN: INTRAVENOUS | Status: DC | PRN

## 2022-10-10 MED ORDER — DILTIAZEM HCL ER COATED BEADS 240 MG PO CP24
240.0000 mg | ORAL_CAPSULE | Freq: Every day | ORAL | Status: DC
  Administered 2022-10-11: 240 mg via ORAL
  Filled 2022-10-10 (×2): qty 1

## 2022-10-10 MED ORDER — ENOXAPARIN SODIUM 40 MG/0.4ML IJ SOSY
40.0000 mg | PREFILLED_SYRINGE | INTRAMUSCULAR | Status: DC
  Administered 2022-10-11: 40 mg via SUBCUTANEOUS
  Filled 2022-10-10 (×2): qty 0.4

## 2022-10-10 MED ORDER — BUPIVACAINE HCL (PF) 0.25 % IJ SOLN
INTRAMUSCULAR | Status: AC
  Filled 2022-10-10: qty 60

## 2022-10-10 MED ORDER — KETOROLAC TROMETHAMINE 30 MG/ML IJ SOLN
INTRAMUSCULAR | Status: AC
  Filled 2022-10-10: qty 1

## 2022-10-10 MED ORDER — BUPIVACAINE HCL (PF) 0.25 % IJ SOLN
INTRAMUSCULAR | Status: DC | PRN
  Administered 2022-10-10: 21 mL

## 2022-10-10 MED ORDER — HYDROMORPHONE HCL 1 MG/ML IJ SOLN
INTRAMUSCULAR | Status: AC
  Filled 2022-10-10: qty 1

## 2022-10-10 MED ORDER — ENSURE PRE-SURGERY PO LIQD: 296.0000 mL | Freq: Once | ORAL | Status: DC

## 2022-10-10 MED ORDER — 0.9 % SODIUM CHLORIDE (POUR BTL) OPTIME
TOPICAL | Status: DC | PRN
  Administered 2022-10-10: 1000 mL

## 2022-10-10 MED ORDER — FENTANYL CITRATE (PF) 100 MCG/2ML IJ SOLN
25.0000 ug | INTRAMUSCULAR | Status: DC | PRN
  Administered 2022-10-10 (×3): 50 ug via INTRAVENOUS

## 2022-10-10 MED ORDER — ALBUMIN HUMAN 5 % IV SOLN: INTRAVENOUS | Status: DC | PRN

## 2022-10-10 MED ORDER — LIDOCAINE 2% (20 MG/ML) 5 ML SYRINGE
INTRAMUSCULAR | Status: DC | PRN
  Administered 2022-10-10: 80 mg via INTRAVENOUS

## 2022-10-10 MED ORDER — ACETAMINOPHEN 500 MG PO TABS: 1000.0000 mg | ORAL_TABLET | ORAL | Status: AC

## 2022-10-10 MED ORDER — ROCURONIUM BROMIDE 10 MG/ML (PF) SYRINGE
PREFILLED_SYRINGE | INTRAVENOUS | Status: AC
  Filled 2022-10-10: qty 20

## 2022-10-10 MED ORDER — ORAL CARE MOUTH RINSE: 15.0000 mL | Freq: Once | OROMUCOSAL | Status: AC

## 2022-10-10 MED ORDER — MIDAZOLAM HCL 2 MG/2ML IJ SOLN
INTRAMUSCULAR | Status: DC | PRN
  Administered 2022-10-10: 2 mg via INTRAVENOUS

## 2022-10-10 MED ORDER — DEXAMETHASONE SODIUM PHOSPHATE 10 MG/ML IJ SOLN
INTRAMUSCULAR | Status: AC
  Filled 2022-10-10: qty 1

## 2022-10-10 MED ORDER — MIDAZOLAM HCL 2 MG/2ML IJ SOLN
INTRAMUSCULAR | Status: AC
  Filled 2022-10-10: qty 2

## 2022-10-10 MED ORDER — PHENYLEPHRINE 80 MCG/ML (10ML) SYRINGE FOR IV PUSH (FOR BLOOD PRESSURE SUPPORT)
PREFILLED_SYRINGE | INTRAVENOUS | Status: DC | PRN
  Administered 2022-10-10 (×3): 80 ug via INTRAVENOUS

## 2022-10-10 MED ORDER — CEFAZOLIN SODIUM-DEXTROSE 2-4 GM/100ML-% IV SOLN
INTRAVENOUS | Status: AC
  Filled 2022-10-10: qty 100

## 2022-10-10 MED ORDER — SODIUM CHLORIDE 0.9 % IV SOLN: INTRAVENOUS | Status: DC

## 2022-10-10 MED ORDER — PHENYLEPHRINE 80 MCG/ML (10ML) SYRINGE FOR IV PUSH (FOR BLOOD PRESSURE SUPPORT)
PREFILLED_SYRINGE | INTRAVENOUS | Status: AC
  Filled 2022-10-10: qty 10

## 2022-10-10 MED ORDER — FENTANYL CITRATE (PF) 250 MCG/5ML IJ SOLN
INTRAMUSCULAR | Status: AC
  Filled 2022-10-10: qty 5

## 2022-10-10 MED ORDER — ONDANSETRON 4 MG PO TBDP
4.0000 mg | ORAL_TABLET | Freq: Four times a day (QID) | ORAL | Status: DC | PRN
  Filled 2022-10-10: qty 1

## 2022-10-10 MED ORDER — HYDROMORPHONE HCL 1 MG/ML IJ SOLN
1.0000 mg | INTRAMUSCULAR | Status: DC | PRN
  Administered 2022-10-10 – 2022-10-11 (×3): 1 mg via INTRAVENOUS
  Filled 2022-10-10: qty 1

## 2022-10-10 MED ORDER — LEVOTHYROXINE SODIUM 25 MCG PO TABS
137.0000 ug | ORAL_TABLET | Freq: Every day | ORAL | Status: DC
  Administered 2022-10-11: 137 ug via ORAL
  Filled 2022-10-10 (×2): qty 1

## 2022-10-10 MED ORDER — FENTANYL CITRATE (PF) 250 MCG/5ML IJ SOLN
INTRAMUSCULAR | Status: DC | PRN
  Administered 2022-10-10 (×2): 50 ug via INTRAVENOUS
  Administered 2022-10-10: 100 ug via INTRAVENOUS
  Administered 2022-10-10: 50 ug via INTRAVENOUS

## 2022-10-10 MED ORDER — VASOPRESSIN 20 UNIT/ML IV SOLN
INTRAVENOUS | Status: AC
  Filled 2022-10-10: qty 1

## 2022-10-10 MED ORDER — DEXAMETHASONE SODIUM PHOSPHATE 10 MG/ML IJ SOLN
INTRAMUSCULAR | Status: DC | PRN
  Administered 2022-10-10: 10 mg via INTRAVENOUS

## 2022-10-10 MED ORDER — SODIUM CHLORIDE 0.9 % IV SOLN
INTRAVENOUS | Status: DC | PRN
  Administered 2022-10-10: 40 mL

## 2022-10-10 MED ORDER — CEFAZOLIN SODIUM-DEXTROSE 2-4 GM/100ML-% IV SOLN
2.0000 g | INTRAVENOUS | Status: AC
  Administered 2022-10-10: 2 g via INTRAVENOUS

## 2022-10-10 SURGICAL SUPPLY — 68 items
ADH SKN CLS APL DERMABOND .7 (GAUZE/BANDAGES/DRESSINGS) ×2
APL PRP STRL LF DISP 70% ISPRP (MISCELLANEOUS) ×2
APPLIER CLIP 5 13 M/L LIGAMAX5 (MISCELLANEOUS)
APR CLP MED LRG 5 ANG JAW (MISCELLANEOUS)
CANNULA REDUC XI 12-8 STAPL (CANNULA) ×2
CANNULA REDUCER 12-8 DVNC XI (CANNULA) ×3 IMPLANT
CHLORAPREP W/TINT 26 (MISCELLANEOUS) ×3 IMPLANT
CLIP APPLIE 5 13 M/L LIGAMAX5 (MISCELLANEOUS) IMPLANT
COVER MAYO STAND STRL (DRAPES) ×3 IMPLANT
COVER SURGICAL LIGHT HANDLE (MISCELLANEOUS) ×3 IMPLANT
COVER TIP SHEARS 8 DVNC (MISCELLANEOUS) IMPLANT
COVER TIP SHEARS 8MM DA VINCI (MISCELLANEOUS)
DEFOGGER SCOPE WARMER CLEARIFY (MISCELLANEOUS) ×3 IMPLANT
DERMABOND ADVANCED .7 DNX12 (GAUZE/BANDAGES/DRESSINGS) ×3 IMPLANT
DEVICE TROCAR PUNCTURE CLOSURE (ENDOMECHANICALS) ×3 IMPLANT
DRAIN PENROSE 0.5X18 (DRAIN) IMPLANT
DRAPE ARM DVNC X/XI (DISPOSABLE) ×12 IMPLANT
DRAPE CARDIOVASC SPLIT 88X140 (DRAPES) ×3 IMPLANT
DRAPE COLUMN DVNC XI (DISPOSABLE) ×3 IMPLANT
DRAPE DA VINCI XI ARM (DISPOSABLE) ×8
DRAPE DA VINCI XI COLUMN (DISPOSABLE) ×2
DRAPE ORTHO SPLIT 77X108 STRL (DRAPES) ×2
DRAPE SURG ORHT 6 SPLT 77X108 (DRAPES) ×3 IMPLANT
ELECT REM PT RETURN 9FT ADLT (ELECTROSURGICAL) ×2
ELECTRODE REM PT RTRN 9FT ADLT (ELECTROSURGICAL) ×3 IMPLANT
GLOVE BIO SURGEON STRL SZ7.5 (GLOVE) ×6 IMPLANT
GLOVE SURG SYN 7.5  E (GLOVE) ×6
GLOVE SURG SYN 7.5 E (GLOVE) ×6 IMPLANT
GLOVE SURG SYN 7.5 PF PI (GLOVE) ×9 IMPLANT
GOWN STRL REUS W/ TWL LRG LVL3 (GOWN DISPOSABLE) ×3 IMPLANT
GOWN STRL REUS W/ TWL XL LVL3 (GOWN DISPOSABLE) ×6 IMPLANT
GOWN STRL REUS W/TWL 2XL LVL3 (GOWN DISPOSABLE) ×3 IMPLANT
GOWN STRL REUS W/TWL LRG LVL3 (GOWN DISPOSABLE) ×2
GOWN STRL REUS W/TWL XL LVL3 (GOWN DISPOSABLE) ×4
KIT BASIN OR (CUSTOM PROCEDURE TRAY) ×3 IMPLANT
KIT TURNOVER KIT B (KITS) IMPLANT
MARKER SKIN DUAL TIP RULER LAB (MISCELLANEOUS) ×3 IMPLANT
MESH BIO-A 7X10 SYN MAT (Mesh General) ×3 IMPLANT
NDL 22X1.5 STRL (OR ONLY) (MISCELLANEOUS) ×3 IMPLANT
NDL INSUFFLATION 14GA 120MM (NEEDLE) ×3 IMPLANT
NEEDLE 22X1.5 STRL (OR ONLY) (MISCELLANEOUS) ×2 IMPLANT
NEEDLE INSUFFLATION 14GA 120MM (NEEDLE) ×2 IMPLANT
OBTURATOR OPTICAL STANDARD 8MM (TROCAR)
OBTURATOR OPTICAL STND 8 DVNC (TROCAR)
OBTURATOR OPTICALSTD 8 DVNC (TROCAR) IMPLANT
PENCIL SMOKE EVACUATOR (MISCELLANEOUS) IMPLANT
SCISSORS LAP 5X35 DISP (ENDOMECHANICALS) IMPLANT
SEAL CANN UNIV 5-8 DVNC XI (MISCELLANEOUS) ×9 IMPLANT
SEAL XI 5MM-8MM UNIVERSAL (MISCELLANEOUS) ×6
SEALER VESSEL DA VINCI XI (MISCELLANEOUS) ×2
SEALER VESSEL EXT DVNC XI (MISCELLANEOUS) ×3 IMPLANT
SET IRRIG TUBING LAPAROSCOPIC (IRRIGATION / IRRIGATOR) ×3 IMPLANT
SET TUBE SMOKE EVAC HIGH FLOW (TUBING) ×3 IMPLANT
SPIKE FLUID TRANSFER (MISCELLANEOUS) ×3 IMPLANT
STAPLER CANNULA SEAL DVNC XI (STAPLE) ×3 IMPLANT
STAPLER CANNULA SEAL XI (STAPLE) ×2
STAPLER VISISTAT 35W (STAPLE) IMPLANT
STOPCOCK 4 WAY LG BORE MALE ST (IV SETS) ×3 IMPLANT
SUT ETHIBOND 0 36 GRN (SUTURE) ×6 IMPLANT
SUT ETHIBOND 2 0 SH (SUTURE)
SUT ETHIBOND 2 0 SH 36X2 (SUTURE) IMPLANT
SUT MNCRL AB 4-0 PS2 18 (SUTURE) ×3 IMPLANT
SUT SILK 0 SH 30 (SUTURE) ×3 IMPLANT
SUT VICRYL 0 UR6 27IN ABS (SUTURE) ×3 IMPLANT
SYR 30ML SLIP (SYRINGE) ×3 IMPLANT
TRAY FOLEY MTR SLVR 16FR STAT (SET/KITS/TRAYS/PACK) ×3 IMPLANT
TRAY LAPAROSCOPIC MC (CUSTOM PROCEDURE TRAY) ×3 IMPLANT
TROCAR ADV FIXATION 5X100MM (TROCAR) ×3 IMPLANT

## 2022-10-10 NOTE — Plan of Care (Signed)
  Problem: Education: Goal: Ability to describe self-care measures that may prevent or decrease complications (Diabetes Survival Skills Education) will improve Outcome: Progressing   Problem: Coping: Goal: Ability to adjust to condition or change in health will improve Outcome: Progressing   Problem: Fluid Volume: Goal: Ability to maintain a balanced intake and output will improve Outcome: Progressing   

## 2022-10-10 NOTE — Anesthesia Postprocedure Evaluation (Signed)
Anesthesia Post Note  Patient: Minola Guin Joye  Procedure(s) Performed: XI ROBOTIC ASSISTED HIATAL HERNIA REPAIR WITH MESH AND FUNDOPLICATION INSERTION OF MESH (Abdomen)     Patient location during evaluation: PACU Anesthesia Type: General Level of consciousness: awake and alert Pain management: pain level controlled Vital Signs Assessment: post-procedure vital signs reviewed and stable Respiratory status: spontaneous breathing, nonlabored ventilation, respiratory function stable and patient connected to nasal cannula oxygen Cardiovascular status: blood pressure returned to baseline and stable Postop Assessment: no apparent nausea or vomiting Anesthetic complications: no   No notable events documented.  Last Vitals:  Vitals:   10/10/22 1300 10/10/22 1400  BP: (!) 151/73 (!) 145/69  Pulse: 79 74  Resp: 15 15  Temp:  37.2 C  SpO2: 95% 94%    Last Pain:  Vitals:   10/10/22 1400  TempSrc:   PainSc: [place]

## 2022-10-10 NOTE — H&P (Signed)
Chief Complaint: Hernia       History of Present Illness: Alexandria [name] is a 61 y.o. female who is seen today as an office consultation at the request of Dr. Etter Sjogren for evaluation of Hernia .   Patient is a 61 year old female who comes in secondary to hiatal hernia.  She has a history of A-fib, on Eliquis.  Sees Dr. Rayann Heman.  Patient states that she is currently not in A-fib and is taking Cardizem and flecainide.  She has a history of hypothyroidism.   Patient states that she has had significant issues with reflux over the last several years.  She previously was told she had a hiatal hernia in 2019 after cardiac CT scan.  She states that she has had increasing issues with reflux at nighttime.  She has a bed that she keeps elevated.  She eats early in the evening to prevent any reflux she is currently on Pepcid as well.  She states even with these interventions she still has significant reflux at nighttime.  She has burning throat, and regurgitation.   Patient had a recent lumpectomy.  She had no previous abdominal surgery.   I did review the patient's CT scan.  She has a small approximate 3 cm hiatal hernia at the hiatus with a moderate slider.           Review of Systems: A complete review of systems was obtained from the patient.  I have reviewed this information and discussed as appropriate with the patient.  See HPI as well for other ROS.   Review of Systems  Constitutional: Negative.   HENT: Negative.    Eyes: Negative.   Respiratory: Negative.    Cardiovascular: Negative.   Gastrointestinal:  Positive for abdominal pain and heartburn.  Genitourinary: Negative.   Musculoskeletal: Negative.   Skin: Negative.   Neurological: Negative.   Endo/Heme/Allergies: Negative.   Psychiatric/Behavioral: Negative.         Medical History: Past Medical History Past Medical History: Diagnosis        Date            Arrhythmia                    GERD (gastroesophageal reflux disease)                    History of stroke                       Hypertension           There is no problem list on file for this patient.     Past Surgical History Past Surgical History: Procedure       Laterality         Date            CARDIAC FOCAL ABLATION UTILIZING RADIATION THERAPY                           MASTECTOMY PARTIAL / LUMPECTOMY                     Allergies No Known Allergies     Current Outpatient Medications on File Prior to Visit Medication       Sig       Dispense         Refill            semaglutide (WEGOVY) 0.25 mg/0.5  mL pen injector          Inject 0.25 mg subcutaneously once a week                                     ALPRAZolam (XANAX) 0.5 MG tablet           alprazolam 0.5 mg tablet  TAKE 1/2-1 TABLET BY MOUTH TWICE A DAY AS NEEDED                               apixaban (ELIQUIS) 5 mg tablet        Eliquis 5 mg tablet                               atorvastatin (LIPITOR) 40 MG tablet  atorvastatin 40 mg tablet                                 dilTIAZem (CARDIZEM CD) 240 MG CD capsule     Take 240 mg by mouth once daily                             dilTIAZem (CARDIZEM) 30 MG tablet           diltiazem 30 mg tablet                          famotidine (PEPCID) 40 MG tablet    Take 40 mg by mouth at bedtime                                flecainide (TAMBOCOR) 100 MG tablet                                             levothyroxine (SYNTHROID) 112 MCG tablet           TAKE 1 TABLET ONCE A DAY FOR 30 DAYS 90                                   pantoprazole (PROTONIX) 40 MG DR tablet            Take 40 mg by mouth 2 (two) times daily                      No current facility-administered medications on file prior to visit.     Family History Family History Problem           Relation           Age of Onset            Hyperlipidemia (Elevated cholesterol)            Mother         Social History   Tobacco Use Smoking Status           Never Smokeless Tobacco    Never     Social  History Social History     Socioeconomic History            Marital status:  Married Tobacco Use            Smoking status:          Never            Smokeless tobacco:    Never Vaping Use            Vaping Use:    Never used Substance and Sexual Activity            Alcohol use:    Yes            Drug use:        Never       Objective:     BP (!) 142/72   Pulse 60   Temp 98 F (36.7 C) (Oral)   Resp 18   Ht '5\' 7"'$  (1.702 m)   Wt 83.4 kg   LMP 01/26/2012   SpO2 95%   BMI 28.80 kg/m   Physical Exam Constitutional:      Appearance: Normal appearance.  HENT:     Head: Normocephalic and atraumatic.     Mouth/Throat:     Mouth: Mucous membranes are moist.     Pharynx: Oropharynx is clear.  Eyes:     General: No scleral icterus.    Pupils: Pupils are equal, round, and reactive to light.  Cardiovascular:     Rate and Rhythm: Normal rate and regular rhythm.     Pulses: Normal pulses.     Heart sounds: No murmur heard.   No friction rub. No gallop.  Pulmonary:     Effort: Pulmonary effort is normal. No respiratory distress.     Breath sounds: Normal breath sounds. No stridor.  Abdominal:     General: Abdomen is flat.  Musculoskeletal:        General: No swelling.  Skin:    General: Skin is warm.  Neurological:     General: No focal deficit present.     Mental Status: She is alert and oriented to person, place, and time. Mental status is at baseline.  Psychiatric:        Mood and Affect: Mood normal.        Thought Content: Thought content normal.        Judgment: Judgment normal.          Assessment and Plan: Diagnoses and all orders for this visit:   Hiatal hernia     Alexandria [name] is a 61 y.o. female    1.          We will proceed to the OR for a robotic hiatal hernia repair with mesh and fundoplication 2.         All risks and benefits were discussed with the patient, to generally include infection, bleeding, damage to  surrounding structures, possible pneumothorax, and recurrence. Alternatives were offered and described.  All questions were answered and the patient voiced understanding of the procedure and wishes to proceed at this point.

## 2022-10-10 NOTE — Anesthesia Procedure Notes (Signed)
Procedure Name: Intubation Date/Time: 10/10/2022 7:43 AM  Performed by: Mariea Clonts, CRNAPre-anesthesia Checklist: Patient identified, Emergency Drugs available, Suction available and Patient being monitored Patient Re-evaluated:Patient Re-evaluated prior to induction Oxygen Delivery Method: Circle System Utilized Preoxygenation: Pre-oxygenation with 100% oxygen Induction Type: IV induction Ventilation: Mask ventilation without difficulty Laryngoscope Size: Miller and 2 Grade View: Grade II Tube type: Oral Number of attempts: 1 Airway Equipment and Method: Stylet and Oral airway Placement Confirmation: ETT inserted through vocal cords under direct vision, positive ETCO2 and breath sounds checked- equal and bilateral Tube secured with: Tape Dental Injury: Teeth and Oropharynx as per pre-operative assessment

## 2022-10-10 NOTE — Op Note (Signed)
10/10/2022  9:12 AM  PATIENT:  Alexandria [name]  61 y.o. female  PRE-OPERATIVE DIAGNOSIS:  hiatal hernia  POST-OPERATIVE DIAGNOSIS:  hiatal hernia type 1 sliding  PROCEDURE:  Procedure(s): XI ROBOTIC ASSISTED HIATAL HERNIA REPAIR WITH MESH AND TOUPET FUNDOPLICATION (N/A) INSERTION OF MESH (N/A)  SURGEON:  Surgeon(s) and Role:    Ralene Ok, MD - Primary   ASSISTANTS: Pryor Curia, RNFA   ANESTHESIA:   local and general  EBL:  minimal   BLOOD ADMINISTERED:none  DRAINS: none   LOCAL MEDICATIONS USED:  BUPIVICAINE  and OTHER exparel  SPECIMEN:  No Specimen  DISPOSITION OF SPECIMEN:  N/A  COUNTS:  YES  TOURNIQUET:  * No tourniquets in log *  DICTATION: .Dragon Dictation The patient was taken back to the operating room and placed in the supine position with bilateral SCDs in place. The patient was prepped and draped in the usual sterile fashion. After appropriate antibiotics were confirmed a timeout was called and all facts were verified.   A Veress needle technique was used to insufflate the abdomen to 15 mm of mercury the paramedian stab incision. Subsequent to this an 8 mm trocar was introduced as was a 8 millimeter camera. At this time the subsequent robotic trochars x3, were then placed adjacent to this trocar approximately 8-10 cm away. Each trocar was inserted under direct visualization, there were total of 4 trochars. A 59m trocar was placed in the midclavicular line.  A 0vicryl was placed to help with closure at the end of the case at the 127mtrocar site and the 73m81mmbilical trocar site.The assistant trocar was then placed in the right lower quadrant under direct visualization. The Nathanson retractor was then visualized inserted into the abdomen and the incision just to the left of the falciform ligament. This was then placed to retract the liver appropriately. At this time the patient was positioned in reverse Trendelenburg.   At this time the robot patient  cart was brought to the bedside and placed in good position and the arms were docked to the trocars appropriately. At this time I proceeded to incise the gastrohepatic ligament.  At this time I proceeded to mobilize the stomach inferiorly and visualize the right crus. The peritoneum over the right crus was incised and right crus was identified. I proceeded to dissect this inferiorly until the left crus was seen joining the right crus. Once the right crus was adequately dissected we turned our to the left crus which was dissected away. This required traction of the stomach to the right side. Once this was visualized we then proceeded to circumferentially dissect the esophagus away from the surrounding tissue. The anterior and posterior vagus was seen along the esophagus at the GE junction.  These were both preserved throughout the entire case. At this time the phrenoesophageal fat pad was dissected away from the esophagus. There was a medium sized sliding hiatal hernia seen. I mobilized the esophagus cephalad approximately 5-6 cm, clearing away the surrounding tissue. The anterior hernia sac was dissected away from the stomach and esophagus.  At this time we turned our attention to the greater curvature the stomach and the omentum was mobilized using the robotic vessel sealer. This was taken up to the greater curvature to the hiatus. This mobilized the entire greater curvature to allow mobilization and the wrap. I then proceeded to bring the greater curvature the stomach posterior to the esophagus, and a shoeshine technique was used to evaluate the mobilization of  the greater curvature.   At this time I proceeded to close the hiatus using interrupted 0 Ethibonds x 3. This brought together the hiatal closure without undue stricture to the esophagus.   A piece of Gore Bio A hiatal mesh was placed over the hiatal closure and sutured to the crus using 0 Ethibonds sutures x 3.  At this time the greater curvature  was brought around the esophagus and sutured using 0 silk sutures interrupted fashion approximately 1 cm apart x3 on each side of the esophagus in a Toupet fashion. A left collar stitch was then used to gastropexy the stomach from the wrap to the diaphragm just lateral to the left crus as.  A second collar stitch was placed from the wrap to the right crus.  The wrap lay loose with no strangulation of the esophagus.  At this time the robot was undocked. The liver trocar was removed. At this time insufflation was evacuated. Skin was reapproximated for Monocryl subcuticular fashion. The skin was then dressed with Dermabond. The patient tolerated the procedure well and was taken to the recovery room in stable condition.     PLAN OF CARE: Admit for overnight observation  PATIENT DISPOSITION:  PACU - hemodynamically stable.   Delay start of Pharmacological VTE agent (>24hrs) due to surgical blood loss or risk of bleeding: not applicable

## 2022-10-10 NOTE — Transfer of Care (Signed)
Immediate Anesthesia Transfer of Care Note  Patient: Alexandria [name]  Procedure(s) Performed: XI ROBOTIC ASSISTED HIATAL HERNIA REPAIR WITH MESH AND FUNDOPLICATION INSERTION OF MESH (Abdomen)  Patient Location: PACU  Anesthesia Type:General  Level of Consciousness: awake, alert , and oriented  Airway & Oxygen Therapy: Patient Spontanous Breathing and Patient connected to nasal cannula oxygen  Post-op Assessment: Report given to RN and Post -op Vital signs reviewed and stable  Post vital signs: Reviewed and stable  Last Vitals:  Vitals Value Taken Time  BP    Temp 36.5 C 10/10/22 0930  Pulse    Resp    SpO2      Last Pain:  Vitals:   10/10/22 0623  TempSrc: Oral  PainSc:          Complications: No notable events documented.

## 2022-10-10 NOTE — Discharge Instructions (Signed)
EATING AFTER YOUR ESOPHAGEAL SURGERY (Stomach Fundoplication, Hiatal Hernia repair, Achalasia surgery, etc)  ######################################################################  EAT Start with a pureed / full liquid diet (see below) Gradually transition to a high fiber diet with a fiber supplement over the next month after discharge.    WALK Walk an hour a day.  Control your pain to do that.    CONTROL PAIN Control pain so that you can walk, sleep, tolerate sneezing/coughing, go up/down stairs.  HAVE A BOWEL MOVEMENT DAILY Keep your bowels regular to avoid problems.  OK to try a laxative to override constipation.  OK to use an antidairrheal to slow down diarrhea.  Call if not better after 2 tries  CALL IF YOU HAVE PROBLEMS/CONCERNS Call if you are still struggling despite following these instructions. Call if you have concerns not answered by these instructions  ######################################################################   After your esophageal surgery, expect some sticking with swallowing over the next 1-2 months.    If food sticks when you eat, it is called "dysphagia".  This is due to swelling around your esophagus at the wrap & hiatal diaphragm repair.  It will gradually ease off over the next few months.  To help you through this temporary phase, we start you out on a pureed (blenderized) diet.  Your first meal in the hospital was thin liquids.  You should have been given a pureed diet by the time you left the hospital.  We ask patients to stay on a pureed diet for the first 2-3 weeks to avoid anything getting "stuck" near your recent surgery.  Don't be alarmed if your ability to swallow doesn't progress according to this plan.  Everyone is different and some diets can advance more or less quickly.    It is often helpful to crush your medications or split them as they can sometimes stick, especially the first week or so.   Some BASIC RULES to follow  are:  Maintain an upright position whenever eating or drinking.  Take small bites - just a teaspoon size bite at a time.  Eat slowly.  It may also help to eat only one food at a time.  Consider nibbling through smaller, more frequent meals & avoid the urge to eat BIG meals  Do not push through feelings of fullness, nausea, or bloatedness  Do not mix solid foods and liquids in the same mouthful  Try not to "wash foods down" with large gulps of liquids.  Avoid carbonated (bubbly/fizzy) drinks.    Avoid foods that make you feel gassy or bloated.  Start with bland foods first.  Wait on trying greasy, fried, or spicy meals until you are tolerating more bland solids well.  Understand that it will be hard to burp and belch at first.  This gradually improves with time.  Expect to be more gassy/flatulent/bloated initially.  Walking will help your body manage it better.  Consider using medications for bloating that contain simethicone such as  Maalox or Gas-X   Consider crushing her medications, especially smaller pills.  The ability to swallow pills should get easier after a few weeks  Eat in a relaxed atmosphere & minimize distractions.  Avoid talking while eating.    Do not use straws.  Following each meal, sit in an upright position (90 degree angle) for 60 to 90 minutes.  Going for a short walk can help as well  If food does stick, don't panic.  Try to relax and let the food pass on its own.    Sipping WARM LIQUID such as strong hot black tea can also help slide it down.   Be gradual in changes & use common sense:  -If you easily tolerating a certain "level" of foods, advance to the next level gradually -If you are having trouble swallowing a particular food, then avoid it.   -If food is sticking when you advance your diet, go back to thinner previous diet (the lower LEVEL) for 1-2 days.  LEVEL 1 = PUREED DIET  Do for the first 2 WEEKS AFTER SURGERY  -Foods in this group are  pureed or blenderized to a smooth, mashed potato-like consistency.  -If necessary, the pureed foods can keep their shape with the addition of a thickening agent.   -Meat should be pureed to a smooth, pasty consistency.  Hot broth or gravy may be added to the pureed meat, approximately 1 oz. of liquid per 3 oz. serving of meat. -CAUTION:  If any foods do not puree into a smooth consistency, swallowing will be more difficult.  (For example, nuts or seeds sometimes do not blend well.)  Hot Foods Cold Foods  Pureed scrambled eggs and cheese Pureed cottage cheese  Baby cereals Thickened juices and nectars  Thinned cooked cereals (no lumps) Thickened milk or eggnog  Pureed French toast or pancakes Ensure  Mashed potatoes Ice cream  Pureed parsley, au gratin, scalloped potatoes, candied sweet potatoes Fruit or Italian ice, sherbet  Pureed buttered or alfredo noodles Plain yogurt  Pureed vegetables (no corn or peas) Instant breakfast  Pureed soups and creamed soups Smooth pudding, mousse, custard  Pureed scalloped apples Whipped gelatin  Gravies Sugar, syrup, honey, jelly  Sauces, cheese, tomato, barbecue, white, creamed Cream  Any baby food Creamer  Alcohol in moderation (not beer or champagne) Margarine  Coffee or tea Mayonnaise   Ketchup, mustard   Apple sauce   SAMPLE MENU:  PUREED DIET Breakfast Lunch Dinner   Orange juice, 1/2 cup  Cream of wheat, 1/2 cup  Pineapple juice, 1/2 cup  Pureed turkey, barley soup, 3/4 cup  Pureed Hawaiian chicken, 3 oz   Scrambled eggs, mashed or blended with cheese, 1/2 cup  Tea or coffee, 1 cup   Whole milk, 1 cup   Non-dairy creamer, 2 Tbsp.  Mashed potatoes, 1/2 cup  Pureed cooled broccoli, 1/2 cup  Apple sauce, 1/2 cup  Coffee or tea  Mashed potatoes, 1/2 cup  Pureed spinach, 1/2 cup  Frozen yogurt, 1/2 cup  Tea or coffee      LEVEL 2 = SOFT DIET  After your first 2 weeks, you can advance to a soft diet.   Keep on this  diet until everything goes down easily.  Hot Foods Cold Foods  White fish Cottage cheese  Stuffed fish Junior baby fruit  Baby food meals Semi thickened juices  Minced soft cooked, scrambled, poached eggs nectars  Souffle & omelets Ripe mashed bananas  Cooked cereals Canned fruit, pineapple sauce, milk  potatoes Milkshake  Buttered or Alfredo noodles Custard  Cooked cooled vegetable Puddings, including tapioca  Sherbet Yogurt  Vegetable soup or alphabet soup Fruit ice, Italian ice  Gravies Whipped gelatin  Sugar, syrup, honey, jelly Junior baby desserts  Sauces:  Cheese, creamed, barbecue, tomato, white Cream  Coffee or tea Margarine   SAMPLE MENU:  LEVEL 2 Breakfast Lunch Dinner   Orange juice, 1/2 cup  Oatmeal, 1/2 cup  Scrambled eggs with cheese, 1/2 cup  Decaffeinated tea, 1 cup  Whole milk, 1 cup    Non-dairy creamer, 2 Tbsp  Pineapple juice, 1/2 cup  Minced beef, 3 oz  Gravy, 2 Tbsp  Mashed potatoes, 1/2 cup  Minced fresh broccoli, 1/2 cup  Applesauce, 1/2 cup  Coffee, 1 cup  Turkey, barley soup, 3/4 cup  Minced Hawaiian chicken, 3 oz  Mashed potatoes, 1/2 cup  Cooked spinach, 1/2 cup  Frozen yogurt, 1/2 cup  Non-dairy creamer, 2 Tbsp      LEVEL 3 = CHOPPED DIET  -After all the foods in level 2 (soft diet) are passing through well you should advance up to more chopped foods.  -It is still important to cut these foods into small pieces and eat slowly.  Hot Foods Cold Foods  Poultry Cottage cheese  Chopped Swedish meatballs Yogurt  Meat salads (ground or flaked meat) Milk  Flaked fish (tuna) Milkshakes  Poached or scrambled eggs Soft, cold, dry cereal  Souffles and omelets Fruit juices or nectars  Cooked cereals Chopped canned fruit  Chopped French toast or pancakes Canned fruit cocktail  Noodles or pasta (no rice) Pudding, mousse, custard  Cooked vegetables (no frozen peas, corn, or mixed vegetables) Green salad  Canned small sweet peas  Ice cream  Creamed soup or vegetable soup Fruit ice, Italian ice  Pureed vegetable soup or alphabet soup Non-dairy creamer  Ground scalloped apples Margarine  Gravies Mayonnaise  Sauces:  Cheese, creamed, barbecue, tomato, white Ketchup  Coffee or tea Mustard   SAMPLE MENU:  LEVEL 3 Breakfast Lunch Dinner   Orange juice, 1/2 cup  Oatmeal, 1/2 cup  Scrambled eggs with cheese, 1/2 cup  Decaffeinated tea, 1 cup  Whole milk, 1 cup  Non-dairy creamer, 2 Tbsp  Ketchup, 1 Tbsp  Margarine, 1 tsp  Salt, 1/4 tsp  Sugar, 2 tsp  Pineapple juice, 1/2 cup  Ground beef, 3 oz  Gravy, 2 Tbsp  Mashed potatoes, 1/2 cup  Cooked spinach, 1/2 cup  Applesauce, 1/2 cup  Decaffeinated coffee  Whole milk  Non-dairy creamer, 2 Tbsp  Margarine, 1 tsp  Salt, 1/4 tsp  Pureed turkey, barley soup, 3/4 cup  Barbecue chicken, 3 oz  Mashed potatoes, 1/2 cup  Ground fresh broccoli, 1/2 cup  Frozen yogurt, 1/2 cup  Decaffeinated tea, 1 cup  Non-dairy creamer, 2 Tbsp  Margarine, 1 tsp  Salt, 1/4 tsp  Sugar, 1 tsp    LEVEL 4:  REGULAR FOODS  -Foods in this group are soft, moist, regularly textured foods.   -This level includes meat and breads, which tend to be the hardest things to swallow.   -Eat very slowly, chew well and continue to avoid carbonated drinks. -most people are at this level in 4-6 weeks  Hot Foods Cold Foods  Baked fish or skinned Soft cheeses - cottage cheese  Souffles and omelets Cream cheese  Eggs Yogurt  Stuffed shells Milk  Spaghetti with meat sauce Milkshakes  Cooked cereal Cold dry cereals (no nuts, dried fruit, coconut)  French toast or pancakes Crackers  Buttered toast Fruit juices or nectars  Noodles or pasta (no rice) Canned fruit  Potatoes (all types) Ripe bananas  Soft, cooked vegetables (no corn, lima, or baked beans) Peeled, ripe, fresh fruit  Creamed soups or vegetable soup Cakes (no nuts, dried fruit, coconut)  Canned chicken  noodle soup Plain doughnuts  Gravies Ice cream  Bacon dressing Pudding, mousse, custard  Sauces:  Cheese, creamed, barbecue, tomato, white Fruit ice, Italian ice, sherbet  Decaffeinated tea or coffee Whipped gelatin  Pork chops Regular gelatin     Canned fruited gelatin molds   Sugar, syrup, honey, jam, jelly   Cream   Non-dairy   Margarine   Oil   Mayonnaise   Ketchup   Mustard   TROUBLESHOOTING IRREGULAR BOWELS  1) Avoid extremes of bowel movements (no bad constipation/diarrhea)  2) Miralax 17gm mixed in 8oz. water or juice-daily. May use BID as needed.  3) Gas-x,Phazyme, etc. as needed for gas & bloating.  4) Soft,bland diet. No spicy,greasy,fried foods.  5) Prilosec over-the-counter as needed  6) May hold gluten/wheat products from diet to see if symptoms improve.  7) May try probiotics (Align, Activa, etc) to help calm the bowels down  7) If symptoms become worse call back immediately.    If you have any questions please call our office at [facility]: [phone].  

## 2022-10-11 ENCOUNTER — Encounter (HOSPITAL_COMMUNITY): Payer: Self-pay | Admitting: General Surgery

## 2022-10-11 ENCOUNTER — Observation Stay (HOSPITAL_COMMUNITY): Payer: BC Managed Care – PPO

## 2022-10-11 DIAGNOSIS — K449 Diaphragmatic hernia without obstruction or gangrene: Secondary | ICD-10-CM | POA: Diagnosis not present

## 2022-10-11 LAB — GLUCOSE, CAPILLARY
Glucose-Capillary: 128 mg/dL — ABNORMAL HIGH (ref 70–99)
Glucose-Capillary: 139 mg/dL — ABNORMAL HIGH (ref 70–99)

## 2022-10-11 MED ORDER — HYDROCODONE-ACETAMINOPHEN 7.5-325 MG/15ML PO SOLN: 15.0000 mL | Freq: Four times a day (QID) | ORAL | 0 refills | Status: DC | PRN

## 2022-10-11 MED ORDER — IOHEXOL 300 MG/ML  SOLN
150.0000 mL | Freq: Once | INTRAMUSCULAR | Status: AC | PRN
  Administered 2022-10-11: 150 mL via ORAL

## 2022-10-11 NOTE — Progress Notes (Signed)
Mobility Specialist - Progress Note   10/11/22 1100  Mobility  Activity Ambulated independently in hallway  Level of Assistance Modified independent, requires aide device or extra time  Assistive Device None  Distance Ambulated (ft) 300 ft  Activity Response Tolerated well  $Mobility charge 1 Mobility    Pt received in bed agreeable to mobility. C/o soreness at surgical site 3/10. Left sitting EOB w/ call bell in reach and all needs met.   [place] Specialist Please contact via SecureChat or Rehab office at [phone]

## 2022-10-11 NOTE — Discharge Summary (Signed)
Physician Discharge Summary  Patient ID: Alexandria [name] MRN: [id number] DOB/AGE: 1961/09/23 61 y.o.  Admit date: 10/10/2022 Discharge date: 10/11/2022  Admission Diagnoses: Hiatal hernia  Discharge Diagnoses:  Principal Problem:   S/P Nissen fundoplication (without gastrostomy tube) procedure Active Problems:   Hiatal hernia   Discharged Condition: good  Hospital Course: Patient was admitted postop.  Please see operative for details. Patient postoperative well-appreciated medical pressure was optimized for the week.  She was tolerated liquids.  She was otherwise ambulating well on her own.  She was detailed for discharge and discharged home.  Consults:  Dietitian  Significant Diagnostic Studies: Esophagram No leak  Treatments: surgery: As above  Discharge Exam: Blood pressure 132/60, pulse (!) 58, temperature 98.2 F (36.8 C), temperature source Oral, resp. rate 16, height '5\' 7"'$  (1.702 m), weight 83.4 kg, last menstrual period 01/26/2012, SpO2 90 %. GI: Incisions clean dry and intact.  Disposition: Discharge disposition: 01-Home or Self Care       Discharge Instructions     Increase activity slowly   Complete by: As directed       Allergies as of 10/11/2022       Reactions   Peanut-containing Drug Products Swelling        Medication List     TAKE these medications    acetaminophen 500 MG tablet Commonly known as: TYLENOL Take 500 mg by mouth every 6 (six) hours as needed for moderate pain or headache.   ALPRAZolam 0.5 MG tablet Commonly known as: XANAX TAKE 1/2 TO 1 TABLET BY MOUTH TWICE A DAY AS NEEDED What changed:  how much to take how to take this when to take this reasons to take this additional instructions   atorvastatin 40 MG tablet Commonly known as: LIPITOR TAKE 1 TABLET BY MOUTH EVERY DAY IN THE EVENING   diltiazem 240 MG 24 hr capsule Commonly known as: CARDIZEM CD TAKE 1 CAPSULE BY MOUTH EVERY DAY   diltiazem 30 MG  tablet Commonly known as: CARDIZEM TAKE 1 TABLET (30 MG TOTAL) BY MOUTH DAILY AS NEEDED. FOR PALPITATIONS/HEART RATE ABOVE 110BPM   Eliquis 5 MG Tabs tablet Generic drug: apixaban TAKE 1 TABLET BY MOUTH TWICE A DAY   famotidine 40 MG tablet Commonly known as: PEPCID TAKE 1 TABLET BY MOUTH EVERYDAY AT BEDTIME   flecainide 100 MG tablet Commonly known as: TAMBOCOR Take 1 tablet (100 mg total) by mouth 2 (two) times daily.   HYDROcodone-acetaminophen 7.5-325 mg/15 ml solution Commonly known as: HYCET Take 15 mLs by mouth 4 (four) times daily as needed for moderate pain.   Iron (Ferrous Sulfate) 325 (65 Fe) MG Tabs Take 325 mg by mouth daily.   levothyroxine 137 MCG tablet Commonly known as: SYNTHROID Take 137 mcg by mouth daily before breakfast.   oxyCODONE 5 MG immediate release tablet Commonly known as: Oxy IR/ROXICODONE Take 1 tablet (5 mg total) by mouth every 6 (six) hours as needed for severe pain.   pantoprazole 40 MG tablet Commonly known as: PROTONIX TAKE 1 TABLET BY MOUTH TWICE A DAY   Wegovy 0.25 MG/0.5ML Soaj Generic drug: Semaglutide-Weight Management Inject 0.25 mg into the skin once a week.        Follow-up Information     Ralene Ok, MD. Schedule an appointment as soon as possible for a visit in 2 week(s).   Specialty: General Surgery Why: Post op visit Contact information: [address] [place] [address] [phone]  Signed: Ralene Ok 10/11/2022, 12:07 PM

## 2022-10-11 NOTE — Progress Notes (Signed)
Michael Boston Stockman to be D/C'd  per MD order.  Discussed with the patient and all questions fully answered.  VSS, Skin clean, dry and intact without evidence of skin break down, no evidence of skin tears noted.  IV catheter discontinued intact. Site without signs and symptoms of complications. Dressing and pressure applied.  An After Visit Summary was printed and given to the patient. Patient received instruction on how to advance her diet every 2 weeks.   D/c re-educated completed with patient/family including follow up instructions, medication list, d/c activities limitations if indicated, with other d/c instructions as indicated by MD - patient able to verbalize understanding, all questions fully answered.   Patient instructed to return to ED, call 911, or call MD for any changes in condition.   Patient to be escorted via [place], and D/C home via private auto.

## 2022-10-11 NOTE — Care Management (Signed)
  Transition of Care [facility]) Screening Note   Patient Details  Name: Alexandria [name] Date of Birth: 1961/11/04   [facility]) CM/SW Contact:    Carles Collet, RN Phone Number: 10/11/2022, 8:12 AM    Transition of Care Department [facility]) has reviewed patient and no TOC needs have been identified at this time. We will continue to monitor patient advancement through interdisciplinary progression rounds. If new patient transition needs arise, please place a TOC consult.

## 2022-10-11 NOTE — Progress Notes (Signed)
Nutrition Brief Note:   MD consult for Pureed Diet education.   RD reviewed Pureed diet with the pt. RD discussed various ways to get in adequate protein while following pureed consistency diet. RD provided pt with handout.  Please re-consult if needed.   Thalia Bloodgood, RD, LDN, CNSC.

## 2022-10-12 ENCOUNTER — Telehealth (HOSPITAL_COMMUNITY): Payer: Self-pay | Admitting: *Deleted

## 2022-10-12 NOTE — Telephone Encounter (Signed)
Pt having esophageal swelling post surgery having difficulty swallowing her bigger pills. Pt took apart the '240mg'$  of cardizem but is worried how quickly medication will take effect. Currently pt is in afib with HRs in the 120s BP 150/90. Discussed with Roderic Palau NP will use PRN cardizem '60mg'$  QID until she is able to swallow '240mg'$ . Pt verbalized agreement and understanding.

## 2022-10-19 ENCOUNTER — Ambulatory Visit (INDEPENDENT_AMBULATORY_CARE_PROVIDER_SITE_OTHER): Payer: BC Managed Care – PPO | Admitting: Family Medicine

## 2022-10-19 ENCOUNTER — Encounter: Payer: Self-pay | Admitting: Family Medicine

## 2022-10-19 VITALS — BP 120/80 | HR 58 | Temp 98.5°F | Resp 18 | Ht 67.0 in | Wt 179.0 lb

## 2022-10-19 DIAGNOSIS — K219 Gastro-esophageal reflux disease without esophagitis: Secondary | ICD-10-CM

## 2022-10-19 DIAGNOSIS — I4891 Unspecified atrial fibrillation: Secondary | ICD-10-CM

## 2022-10-19 DIAGNOSIS — E663 Overweight: Secondary | ICD-10-CM

## 2022-10-19 DIAGNOSIS — K449 Diaphragmatic hernia without obstruction or gangrene: Secondary | ICD-10-CM

## 2022-10-19 DIAGNOSIS — D509 Iron deficiency anemia, unspecified: Secondary | ICD-10-CM | POA: Diagnosis not present

## 2022-10-19 DIAGNOSIS — Z0000 Encounter for general adult medical examination without abnormal findings: Secondary | ICD-10-CM

## 2022-10-19 DIAGNOSIS — E785 Hyperlipidemia, unspecified: Secondary | ICD-10-CM | POA: Diagnosis not present

## 2022-10-19 DIAGNOSIS — Z23 Encounter for immunization: Secondary | ICD-10-CM | POA: Diagnosis not present

## 2022-10-19 DIAGNOSIS — F419 Anxiety disorder, unspecified: Secondary | ICD-10-CM

## 2022-10-19 DIAGNOSIS — E039 Hypothyroidism, unspecified: Secondary | ICD-10-CM | POA: Diagnosis not present

## 2022-10-19 DIAGNOSIS — I10 Essential (primary) hypertension: Secondary | ICD-10-CM

## 2022-10-19 DIAGNOSIS — Z808 Family history of malignant neoplasm of other organs or systems: Secondary | ICD-10-CM

## 2022-10-19 LAB — LIPID PANEL
Cholesterol: 130 mg/dL (ref 0–200)
HDL: 33.1 mg/dL — ABNORMAL LOW (ref >39.00)
LDL Cholesterol: 75 mg/dL (ref 0–99)
NonHDL: 96.42
Total CHOL/HDL Ratio: 4
Triglycerides: 107 mg/dL (ref 0.0–149.0)
VLDL: 21.4 mg/dL (ref 0.0–40.0)

## 2022-10-19 LAB — CBC WITH DIFFERENTIAL/PLATELET
Basophils Absolute: 0 10*3/uL (ref 0.0–0.1)
Basophils Relative: 0.7 % (ref 0.0–3.0)
Eosinophils Absolute: 0.1 10*3/uL (ref 0.0–0.7)
Eosinophils Relative: 1.5 % (ref 0.0–5.0)
HCT: 40 % (ref 36.0–46.0)
Hemoglobin: 13.4 g/dL (ref 12.0–15.0)
Lymphocytes Relative: 21.7 % (ref 12.0–46.0)
Lymphs Abs: 1.2 10*3/uL (ref 0.7–4.0)
MCHC: 33.4 g/dL (ref 30.0–36.0)
MCV: 87.2 fl (ref 78.0–100.0)
Monocytes Absolute: 0.5 10*3/uL (ref 0.1–1.0)
Monocytes Relative: 8.5 % (ref 3.0–12.0)
Neutro Abs: 3.6 10*3/uL (ref 1.4–7.7)
Neutrophils Relative %: 67.6 % (ref 43.0–77.0)
Platelets: 314 10*3/uL (ref 150.0–400.0)
RBC: 4.59 Mil/uL (ref 3.87–5.11)
RDW: 17.2 % — ABNORMAL HIGH (ref 11.5–15.5)
WBC: 5.4 10*3/uL (ref 4.0–10.5)

## 2022-10-19 LAB — IBC PANEL
Iron: 94 ug/dL (ref 42–145)
Saturation Ratios: 37.9 % (ref 20.0–50.0)
TIBC: 247.8 ug/dL — ABNORMAL LOW (ref 250.0–450.0)
Transferrin: 177 mg/dL — ABNORMAL LOW (ref 212.0–360.0)

## 2022-10-19 LAB — COMPREHENSIVE METABOLIC PANEL
ALT: 16 U/L (ref 0–35)
AST: 12 U/L (ref 0–37)
Albumin: 4.1 g/dL (ref 3.5–5.2)
Alkaline Phosphatase: 134 U/L — ABNORMAL HIGH (ref 39–117)
BUN: 16 mg/dL (ref 6–23)
CO2: 30 mEq/L (ref 19–32)
Calcium: 9.1 mg/dL (ref 8.4–10.5)
Chloride: 105 mEq/L (ref 96–112)
Creatinine, Ser: 0.84 mg/dL (ref 0.40–1.20)
GFR: 73.92 mL/min (ref >60.00)
Glucose, Bld: 105 mg/dL — ABNORMAL HIGH (ref 70–99)
Potassium: 4.5 mEq/L (ref 3.5–5.1)
Sodium: 140 mEq/L (ref 135–145)
Total Bilirubin: 0.6 mg/dL (ref 0.2–1.2)
Total Protein: 6.8 g/dL (ref 6.0–8.3)

## 2022-10-19 LAB — TSH: TSH: 0.47 u[IU]/mL (ref 0.35–5.50)

## 2022-10-19 NOTE — Assessment & Plan Note (Signed)
Per cardiology 

## 2022-10-19 NOTE — Assessment & Plan Note (Signed)
Check labs 

## 2022-10-19 NOTE — Progress Notes (Signed)
Subjective:   By signing my name below, I, Alexandria [name], attest that this documentation has been prepared under the direction and in the presence of Alexandria Held, DO. 10/19/2022   Patient ID: Alexandria [name], female    DOB: 12-16-1960, 61 y.o.   MRN: [id number]  Chief Complaint  Patient presents with   Annual Exam    Pt states not fasting     HPI Patient is in today for a comprehensive physical exam.  She reports having moles she would like further evaluation for. Her father has a history of basal cell carcinoma. She is requesting for an appointment with a dermatologist.  She is no longer taking wegovy due to her pharmacy running out of supply and being on a puree diet.  She is not taking pain medication due to the chance of over dosing. She is only taking tylenol to manage her pain and reports finding mild improvement.  She is on a puree diet due to recovering from a recent procedure. She notes her tongue recently developed a white discoloration.  She does not follow up with her GYN any longer. She is due for a pap smear and is interested in completing it at another time.  She denies having any fever, new moles, congestion, sinus pain, sore throat, chest pain, palpitations, cough, shortness of breath, wheezing, nausea, vomiting, diarrhea, constipation, dysuria, frequency, abdominal pain, hematuria, new muscle pain, new joint pain, headaches. She has no changes to her family medical history.  She is interested in receiving the flu vaccine during this visit.  She is UTD on dental and vision care.   Mammogram: Last completed 11/08/2021. Result showed Further evaluation is suggested for possible asymmetry and possible mass in the right breast. Further evaluation is suggested for calcifications in the left breast. Dexa: Last completed 11/01/2021. Results are normal. Colonoscopy: Last completed 04/20/2021. Results showed: - Three 6 mm polyps in the rectum and in the mid sigmoid  colon, removed with a cold snare. Resected and retrieved. - Non-bleeding internal hemorrhoids. - The examined portion of the ileum was normal.   Past Medical History:  Diagnosis Date   Anemia    hx of iron deficiency anemia (pt had iron infusions)   Anxiety    Atrial fibrillation (HCC)    Depression    GERD (gastroesophageal reflux disease)    Graves disease    Hiatal hernia    a. seen on CT 12/2016.   Hyperlipidemia    Hypertension    Hypothyroidism    pt had radioiodine treatment on thyroid   Low iron    pt received iron infusions 08/2022   NSVT (nonsustained ventricular tachycardia) (HCC)    Pulmonary nodules    a. seen on CT 12/2016.   Stroke [facility])    TIA (transient ischemic attack) 01/2017   a. tx at [facility].    Past Surgical History:  Procedure Laterality Date   ATRIAL FIBRILLATION ABLATION N/A 09/12/2018   Procedure: ATRIAL FIBRILLATION ABLATION;  Surgeon: Thompson Grayer, MD;  Location: [place] [facility];  Service: Cardiovascular;  Laterality: N/A;   BREAST LUMPECTOMY WITH RADIOACTIVE SEED LOCALIZATION Right 06/28/2022   Procedure: RIGHT BREAST LUMPECTOMY WITH RADIOACTIVE SEED LOCALIZATION;  Surgeon: Erroll Luna, MD;  Location: [place];  Service: General;  Laterality: Right;   CARPAL TUNNEL RELEASE Right [place] OF UTERUS  2018   INSERTION OF MESH N/A 10/10/2022   Procedure: INSERTION OF MESH;  Surgeon: Ralene Ok,  MD;  Location: [place];  Service: General;  Laterality: N/A;   THYROID SURGERY  2012   radioactive iodine swallowed   WISDOM TOOTH EXTRACTION  1980   XI ROBOTIC ASSISTED HIATAL HERNIA REPAIR N/A 10/10/2022   Procedure: XI ROBOTIC ASSISTED HIATAL HERNIA REPAIR WITH MESH AND FUNDOPLICATION;  Surgeon: Ralene Ok, MD;  Location: [place];  Service: General;  Laterality: N/A;    Family History  Problem Relation Age of Onset   Hyperlipidemia Mother    Dementia Mother    Coronary artery disease Father    Atrial  fibrillation Father    Skin cancer Father    Colon polyps Brother        says he goes every year to have a colonoscopy   Diabetes Maternal Grandmother    Coronary artery disease Paternal Grandfather    Coronary artery disease Paternal Uncle    Colon cancer Neg Hx    Esophageal cancer Neg Hx     Social History   Socioeconomic History   Marital status: Married    Spouse name: Not on file   Number of children: 4   Years of education: Not on file   Highest education level: Not on file  Occupational History   Occupation: Music therapist    Comment: retired  Tobacco Use   Smoking status: Never   Smokeless tobacco: Never  Vaping Use   Vaping Use: Never used  Substance and Sexual Activity   Alcohol use: Yes    Alcohol/week: 1.0 standard drink of alcohol    Types: 1 Glasses of wine per week    Comment: very occasionally, not every week   Drug use: No   Sexual activity: Yes  Other Topics Concern   Not on file  Social History Narrative   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   Social Determinants of Alexandria [name]: Not on file  Food Insecurity: No [place] (10/10/2022)   Hunger Vital Sign    Worried About Running Out of Food in the Last Year: Never true    Ran Out of Food in the Last Year: Never true  Transportation Needs: No Transportation Needs (10/10/2022)   PRAPARE - Hydrologist (Medical): No    Lack of Transportation (Non-Medical): No  Physical Activity: Not on file  Stress: Not on file  Social Connections: Not on file  Intimate Partner Violence: Not At Risk (10/10/2022)   Humiliation, Afraid, Rape, and Kick questionnaire    Fear of Current or Ex-Partner: No    Emotionally Abused: No    Physically Abused: No    Sexually Abused: No    Outpatient Medications Prior to Visit  Medication Sig Dispense Refill   acetaminophen (TYLENOL) 500 MG tablet Take 500 mg by mouth every 6 (six) hours as  needed for moderate pain or headache.     ALPRAZolam (XANAX) 0.5 MG tablet TAKE 1/2 TO 1 TABLET BY MOUTH TWICE A DAY AS NEEDED (Patient taking differently: Take 0.125-0.25 mg by mouth 2 (two) times daily as needed for anxiety.) 30 tablet 1   atorvastatin (LIPITOR) 40 MG tablet TAKE 1 TABLET BY MOUTH EVERY DAY IN THE EVENING 90 tablet 3   diltiazem (CARDIZEM CD) 240 MG 24 hr capsule TAKE 1 CAPSULE BY MOUTH EVERY DAY 90 capsule 2   diltiazem (CARDIZEM) 30 MG tablet TAKE 1 TABLET (30 MG TOTAL) BY MOUTH DAILY AS NEEDED. FOR PALPITATIONS/HEART RATE ABOVE 110BPM 90  tablet 1   ELIQUIS 5 MG TABS tablet TAKE 1 TABLET BY MOUTH TWICE A DAY 60 tablet 6   famotidine (PEPCID) 40 MG tablet TAKE 1 TABLET BY MOUTH EVERYDAY AT BEDTIME 90 tablet 2   flecainide (TAMBOCOR) 100 MG tablet Take 1 tablet (100 mg total) by mouth 2 (two) times daily. 180 tablet 3   Iron, Ferrous Sulfate, 325 (65 Fe) MG TABS Take 325 mg by mouth daily. 30 tablet 5   levothyroxine (SYNTHROID) 137 MCG tablet Take 137 mcg by mouth daily before breakfast.     pantoprazole (PROTONIX) 40 MG tablet TAKE 1 TABLET BY MOUTH TWICE A DAY 180 tablet 1   Semaglutide-Weight Management (WEGOVY) 0.25 MG/0.5ML SOAJ Inject 0.25 mg into the skin once a week. 2 mL 0   HYDROcodone-acetaminophen (HYCET) 7.5-325 mg/15 ml solution Take 15 mLs by mouth 4 (four) times daily as needed for moderate pain. (Patient not taking: Reported on 10/19/2022) 120 mL 0   oxyCODONE (OXY IR/ROXICODONE) 5 MG immediate release tablet Take 1 tablet (5 mg total) by mouth every 6 (six) hours as needed for severe pain. (Patient not taking: Reported on 10/19/2022) 15 tablet 0   No facility-administered medications prior to visit.    Allergies  Allergen Reactions   Peanut-Containing Drug Products Swelling    Review of Systems  Constitutional:  Negative for chills, fever and malaise/fatigue.  HENT:  Negative for congestion, hearing loss, sinus pain and sore throat.   Eyes:  Negative  for discharge.  Respiratory:  Negative for cough, sputum production, shortness of breath and wheezing.   Cardiovascular:  Negative for chest pain, palpitations and leg swelling.  Gastrointestinal:  Negative for abdominal pain, blood in stool, constipation, diarrhea, heartburn, nausea and vomiting.  Genitourinary:  Negative for dysuria, frequency, hematuria and urgency.  Musculoskeletal:  Negative for back pain, falls and myalgias.       (-)new muscle pain (-)new joint pain  Skin:  Negative for rash.       (-)New moles  Neurological:  Negative for dizziness, sensory change, loss of consciousness, weakness and headaches.  Endo/Heme/Allergies:  Negative for environmental allergies. Does not bruise/bleed easily.  Psychiatric/Behavioral:  Negative for depression and suicidal ideas. The patient is not nervous/anxious and does not have insomnia.        Objective:    Physical Exam Vitals and nursing note reviewed.  Constitutional:      General: She is not in acute distress.    Appearance: Normal appearance. She is not ill-appearing.  HENT:     Head: Normocephalic and atraumatic.     Right Ear: Tympanic membrane, ear canal and external ear normal.     Left Ear: Tympanic membrane, ear canal and external ear normal.  Eyes:     Extraocular Movements: Extraocular movements intact.     Pupils: Pupils are equal, round, and reactive to light.  Cardiovascular:     Rate and Rhythm: Normal rate and regular rhythm.     Heart sounds: Normal heart sounds. No murmur heard.    No gallop.  Pulmonary:     Effort: Pulmonary effort is normal. No respiratory distress.     Breath sounds: Normal breath sounds. No wheezing or rales.  Abdominal:     General: Bowel sounds are normal. There is no distension.     Palpations: Abdomen is soft.     Tenderness: There is no abdominal tenderness. There is no guarding.  Musculoskeletal:        General: Normal range  of motion.  Skin:    General: Skin is warm and  dry.  Neurological:     General: No focal deficit present.     Mental Status: She is alert and oriented to person, place, and time.  Psychiatric:        Mood and Affect: Mood normal.        Behavior: Behavior normal.        Thought Content: Thought content normal.        Judgment: Judgment normal.     BP 120/80 (BP Location: Left Arm, Patient Position: Sitting, Cuff Size: Normal)   Pulse (!) 58   Temp 98.5 F (36.9 C) (Oral)   Resp 18   Ht '5\' 7"'$  (1.702 m)   Wt 179 lb (81.2 kg)   LMP 01/26/2012   SpO2 96%   BMI 28.04 kg/m  Wt Readings from Last 3 Encounters:  10/19/22 179 lb (81.2 kg)  10/10/22 183 lb 14.4 oz (83.4 kg)  10/02/22 183 lb 14.4 oz (83.4 kg)       Assessment & Plan:  Preventative health care -     CBC with Differential/Platelet -     Comprehensive metabolic panel -     Lipid panel -     TSH  Need for influenza vaccination -     Flu Vaccine QUAD 54moIM (Fluarix, Fluzone & Alfiuria Quad PF)  Overweight (BMI 25.0-29.9)  Hypothyroidism, unspecified type Assessment & Plan: Check labs  Con't synthroid  Orders: -     TSH  Hyperlipidemia, unspecified hyperlipidemia type Assessment & Plan: Encourage heart healthy diet such as MIND or DASH diet, increase exercise, avoid trans fats, simple carbohydrates and processed foods, consider a krill or fish or flaxseed oil cap daily.    Orders: -     CBC with Differential/Platelet -     Comprehensive metabolic panel -     Lipid panel -     TSH  Atrial fibrillation, unspecified type ([facility] Assessment & Plan: Per cardiology   Hiatal hernia with gastroesophageal reflux Assessment & Plan: On protonix ---  s/p repair    Family history of skin cancer -     Ambulatory referral to Dermatology  Iron deficiency anemia, unspecified iron deficiency anemia type Assessment & Plan: Check labs   Orders: -     IBC panel  Anxiety Assessment & Plan: Stable    Essential hypertension Assessment & Plan: Well  controlled, no changes to meds. Encouraged heart healthy diet such as the DASH diet and exercise as tolerated.       I,Alexandria Held DO, personally preformed the services described in this documentation.  All medical record entries made by the scribe were at my direction and in my presence.  I have reviewed the chart and discharge instructions (if applicable) and agree that the record reflects my personal performance and is accurate and complete. 10/19/2022   I,Alexandria [name],acting as a scribe for YAnn Held DO.,have documented all relevant documentation on the behalf of YAnn Held DO,as directed by  YAnn Held DO while in the presence of YAnn Held DO.   YAnn Held DO

## 2022-10-19 NOTE — Assessment & Plan Note (Signed)
Well controlled, no changes to meds. Encouraged heart healthy diet such as the DASH diet and exercise as tolerated.  °

## 2022-10-19 NOTE — Assessment & Plan Note (Signed)
On protonix ---  s/p repair

## 2022-10-19 NOTE — Assessment & Plan Note (Signed)
Check labs  Con't synthroid 

## 2022-10-19 NOTE — Assessment & Plan Note (Signed)
Stable

## 2022-10-19 NOTE — Assessment & Plan Note (Signed)
Encourage heart healthy diet such as MIND or DASH diet, increase exercise, avoid trans fats, simple carbohydrates and processed foods, consider a krill or fish or flaxseed oil cap daily.  °

## 2022-10-19 NOTE — Patient Instructions (Signed)
Preventive Care 40-64 Years Old, Female Preventive care refers to lifestyle choices and visits with your health care provider that can promote health and wellness. Preventive care visits are also called wellness exams. What can I expect for my preventive care visit? Counseling Your health care provider may ask you questions about your: Medical history, including: Past medical problems. Family medical history. Pregnancy history. Current health, including: Menstrual cycle. Method of birth control. Emotional well-being. Home life and relationship well-being. Sexual activity and sexual health. Lifestyle, including: Alcohol, nicotine or tobacco, and drug use. Access to firearms. Diet, exercise, and sleep habits. Work and work environment. Sunscreen use. Safety issues such as seatbelt and bike helmet use. Physical exam Your health care provider will check your: Height and weight. These may be used to calculate your BMI (body mass index). BMI is a measurement that tells if you are at a healthy weight. Waist circumference. This measures the distance around your waistline. This measurement also tells if you are at a healthy weight and may help predict your risk of certain diseases, such as type 2 diabetes and high blood pressure. Heart rate and blood pressure. Body temperature. Skin for abnormal spots. What immunizations do I need?  Vaccines are usually given at various ages, according to a schedule. Your health care provider will recommend vaccines for you based on your age, medical history, and lifestyle or other factors, such as travel or where you work. What tests do I need? Screening Your health care provider may recommend screening tests for certain conditions. This may include: Lipid and cholesterol levels. Diabetes screening. This is done by checking your blood sugar (glucose) after you have not eaten for a while (fasting). Pelvic exam and Pap test. Hepatitis B test. Hepatitis C  test. HIV (human immunodeficiency virus) test. STI (sexually transmitted infection) testing, if you are at risk. Lung cancer screening. Colorectal cancer screening. Mammogram. Talk with your health care provider about when you should start having regular mammograms. This may depend on whether you have a family history of breast cancer. BRCA-related cancer screening. This may be done if you have a family history of breast, ovarian, tubal, or peritoneal cancers. Bone density scan. This is done to screen for osteoporosis. Talk with your health care provider about your test results, treatment options, and if necessary, the need for more tests. Follow these instructions at home: Eating and drinking  Eat a diet that includes fresh fruits and vegetables, whole grains, lean protein, and low-fat dairy products. Take vitamin and mineral supplements as recommended by your health care provider. Do not drink alcohol if: Your health care provider tells you not to drink. You are pregnant, may be pregnant, or are planning to become pregnant. If you drink alcohol: Limit how much you have to 0-1 drink a day. Know how much alcohol is in your drink. In the [place] one drink equals one 12 oz bottle of beer (355 mL), one 5 oz glass of wine (148 mL), or one 1 oz glass of hard liquor (44 mL). Lifestyle Brush your teeth every morning and night with fluoride toothpaste. Floss one time each day. Exercise for at least 30 minutes 5 or more days each week. Do not use any products that contain nicotine or tobacco. These products include cigarettes, chewing tobacco, and vaping devices, such as e-cigarettes. If you need help quitting, ask your health care provider. Do not use drugs. If you are sexually active, practice safe sex. Use a condom or other form of protection to   prevent STIs. If you do not wish to become pregnant, use a form of birth control. If you plan to become pregnant, see your health care provider for a  prepregnancy visit. Take aspirin only as told by your health care provider. Make sure that you understand how much to take and what form to take. Work with your health care provider to find out whether it is safe and beneficial for you to take aspirin daily. Find healthy ways to manage stress, such as: Meditation, yoga, or listening to music. Journaling. Talking to a trusted person. Spending time with friends and family. Minimize exposure to UV radiation to reduce your risk of skin cancer. Safety Always wear your seat belt while driving or riding in a vehicle. Do not drive: If you have been drinking alcohol. Do not ride with someone who has been drinking. When you are tired or distracted. While texting. If you have been using any mind-altering substances or drugs. Wear a helmet and other protective equipment during sports activities. If you have firearms in your house, make sure you follow all gun safety procedures. Seek help if you have been physically or sexually abused. What's next? Visit your health care provider once a year for an annual wellness visit. Ask your health care provider how often you should have your eyes and teeth checked. Stay up to date on all vaccines. This information is not intended to replace advice given to you by your health care provider. Make sure you discuss any questions you have with your health care provider. Document Revised: 04/20/2021 Document Reviewed: 04/20/2021 Elsevier Patient Education  [place].

## 2022-12-09 ENCOUNTER — Other Ambulatory Visit: Payer: Self-pay

## 2022-12-09 ENCOUNTER — Inpatient Hospital Stay (HOSPITAL_BASED_OUTPATIENT_CLINIC_OR_DEPARTMENT_OTHER)
Admission: EM | Admit: 2022-12-09 | Discharge: 2022-12-12 | DRG: 390 | Disposition: A | Discharge status: Home / Self Care | Payer: BC Managed Care – PPO | Attending: Surgery | Admitting: Surgery

## 2022-12-09 DIAGNOSIS — K56609 Unspecified intestinal obstruction, unspecified as to partial versus complete obstruction: Principal | ICD-10-CM

## 2022-12-09 DIAGNOSIS — F32A Depression, unspecified: Secondary | ICD-10-CM | POA: Diagnosis present

## 2022-12-09 DIAGNOSIS — Z808 Family history of malignant neoplasm of other organs or systems: Secondary | ICD-10-CM

## 2022-12-09 DIAGNOSIS — Z8249 Family history of ischemic heart disease and other diseases of the circulatory system: Secondary | ICD-10-CM

## 2022-12-09 DIAGNOSIS — K56600 Partial intestinal obstruction, unspecified as to cause: Secondary | ICD-10-CM | POA: Diagnosis not present

## 2022-12-09 DIAGNOSIS — E785 Hyperlipidemia, unspecified: Secondary | ICD-10-CM | POA: Diagnosis present

## 2022-12-09 DIAGNOSIS — I4891 Unspecified atrial fibrillation: Secondary | ICD-10-CM | POA: Diagnosis present

## 2022-12-09 DIAGNOSIS — N3000 Acute cystitis without hematuria: Secondary | ICD-10-CM

## 2022-12-09 DIAGNOSIS — Z833 Family history of diabetes mellitus: Secondary | ICD-10-CM

## 2022-12-09 DIAGNOSIS — I10 Essential (primary) hypertension: Secondary | ICD-10-CM | POA: Diagnosis present

## 2022-12-09 DIAGNOSIS — E039 Hypothyroidism, unspecified: Secondary | ICD-10-CM | POA: Diagnosis present

## 2022-12-09 DIAGNOSIS — Z83438 Family history of other disorder of lipoprotein metabolism and other lipidemia: Secondary | ICD-10-CM

## 2022-12-09 DIAGNOSIS — N3090 Cystitis, unspecified without hematuria: Secondary | ICD-10-CM | POA: Diagnosis present

## 2022-12-09 DIAGNOSIS — Z79890 Hormone replacement therapy (postmenopausal): Secondary | ICD-10-CM

## 2022-12-09 DIAGNOSIS — Z8673 Personal history of transient ischemic attack (TIA), and cerebral infarction without residual deficits: Secondary | ICD-10-CM

## 2022-12-09 DIAGNOSIS — K219 Gastro-esophageal reflux disease without esophagitis: Secondary | ICD-10-CM | POA: Diagnosis present

## 2022-12-09 DIAGNOSIS — Z7901 Long term (current) use of anticoagulants: Secondary | ICD-10-CM

## 2022-12-09 LAB — CBC WITH DIFFERENTIAL/PLATELET
Abs Immature Granulocytes: 0.09 10*3/uL — ABNORMAL HIGH (ref 0.00–0.07)
Basophils Absolute: 0.1 10*3/uL (ref 0.0–0.1)
Basophils Relative: 0 %
Eosinophils Absolute: 0.1 10*3/uL (ref 0.0–0.5)
Eosinophils Relative: 1 %
HCT: 42.9 % (ref 36.0–46.0)
Hemoglobin: 14.4 g/dL (ref 12.0–15.0)
Immature Granulocytes: 1 %
Lymphocytes Relative: 7 %
Lymphs Abs: 1 10*3/uL (ref 0.7–4.0)
MCH: 29.8 pg (ref 26.0–34.0)
MCHC: 33.6 g/dL (ref 30.0–36.0)
MCV: 88.8 fL (ref 80.0–100.0)
Monocytes Absolute: 0.7 10*3/uL (ref 0.1–1.0)
Monocytes Relative: 4 %
Neutro Abs: 13.3 10*3/uL — ABNORMAL HIGH (ref 1.7–7.7)
Neutrophils Relative %: 87 %
Platelets: 272 10*3/uL (ref 150–400)
RBC: 4.83 MIL/uL (ref 3.87–5.11)
RDW: 14.8 % (ref 11.5–15.5)
WBC: 15.2 10*3/uL — ABNORMAL HIGH (ref 4.0–10.5)
nRBC: 0 % (ref 0.0–0.2)

## 2022-12-09 MED ORDER — DICYCLOMINE HCL 10 MG/ML IM SOLN
20.0000 mg | Freq: Once | INTRAMUSCULAR | Status: AC
  Administered 2022-12-09: 20 mg via INTRAMUSCULAR
  Filled 2022-12-09: qty 2

## 2022-12-09 MED ORDER — FAMOTIDINE IN NACL 20-0.9 MG/50ML-% IV SOLN: 20.0000 mg | Freq: Once | INTRAVENOUS | Status: DC

## 2022-12-09 MED ORDER — ALUM & MAG HYDROXIDE-SIMETH 200-200-20 MG/5ML PO SUSP
30.0000 mL | Freq: Once | ORAL | Status: AC
  Administered 2022-12-09: 30 mL via ORAL
  Filled 2022-12-09: qty 30

## 2022-12-09 MED ORDER — ONDANSETRON HCL 4 MG/2ML IJ SOLN
4.0000 mg | Freq: Once | INTRAMUSCULAR | Status: AC
  Administered 2022-12-09: 4 mg via INTRAVENOUS
  Filled 2022-12-09: qty 2

## 2022-12-09 MED ORDER — SODIUM CHLORIDE 0.9 % IV SOLN: Freq: Once | INTRAVENOUS | Status: AC

## 2022-12-09 NOTE — ED Triage Notes (Signed)
Pt here from home for sudden onset upper abd pain. Pt reports having hiatal hernia surgery 10/10/22, and was just cleared last week to return to regular diet. Pt endorses nausea, but no vomiting.

## 2022-12-10 ENCOUNTER — Encounter (HOSPITAL_BASED_OUTPATIENT_CLINIC_OR_DEPARTMENT_OTHER): Payer: Self-pay

## 2022-12-10 ENCOUNTER — Observation Stay (HOSPITAL_COMMUNITY): Payer: BC Managed Care – PPO

## 2022-12-10 ENCOUNTER — Emergency Department (HOSPITAL_BASED_OUTPATIENT_CLINIC_OR_DEPARTMENT_OTHER): Payer: BC Managed Care – PPO

## 2022-12-10 DIAGNOSIS — K219 Gastro-esophageal reflux disease without esophagitis: Secondary | ICD-10-CM | POA: Diagnosis present

## 2022-12-10 DIAGNOSIS — K56609 Unspecified intestinal obstruction, unspecified as to partial versus complete obstruction: Secondary | ICD-10-CM | POA: Diagnosis present

## 2022-12-10 DIAGNOSIS — F32A Depression, unspecified: Secondary | ICD-10-CM | POA: Diagnosis present

## 2022-12-10 DIAGNOSIS — E039 Hypothyroidism, unspecified: Secondary | ICD-10-CM | POA: Diagnosis present

## 2022-12-10 DIAGNOSIS — Z79890 Hormone replacement therapy (postmenopausal): Secondary | ICD-10-CM | POA: Diagnosis not present

## 2022-12-10 DIAGNOSIS — I4891 Unspecified atrial fibrillation: Secondary | ICD-10-CM | POA: Diagnosis present

## 2022-12-10 DIAGNOSIS — I10 Essential (primary) hypertension: Secondary | ICD-10-CM | POA: Diagnosis present

## 2022-12-10 DIAGNOSIS — N3090 Cystitis, unspecified without hematuria: Secondary | ICD-10-CM | POA: Diagnosis present

## 2022-12-10 DIAGNOSIS — Z7901 Long term (current) use of anticoagulants: Secondary | ICD-10-CM | POA: Diagnosis not present

## 2022-12-10 DIAGNOSIS — Z8249 Family history of ischemic heart disease and other diseases of the circulatory system: Secondary | ICD-10-CM | POA: Diagnosis not present

## 2022-12-10 DIAGNOSIS — Z833 Family history of diabetes mellitus: Secondary | ICD-10-CM | POA: Diagnosis not present

## 2022-12-10 DIAGNOSIS — K56600 Partial intestinal obstruction, unspecified as to cause: Secondary | ICD-10-CM | POA: Diagnosis present

## 2022-12-10 DIAGNOSIS — Z808 Family history of malignant neoplasm of other organs or systems: Secondary | ICD-10-CM | POA: Diagnosis not present

## 2022-12-10 DIAGNOSIS — Z83438 Family history of other disorder of lipoprotein metabolism and other lipidemia: Secondary | ICD-10-CM | POA: Diagnosis not present

## 2022-12-10 DIAGNOSIS — Z8673 Personal history of transient ischemic attack (TIA), and cerebral infarction without residual deficits: Secondary | ICD-10-CM | POA: Diagnosis not present

## 2022-12-10 DIAGNOSIS — E785 Hyperlipidemia, unspecified: Secondary | ICD-10-CM | POA: Diagnosis present

## 2022-12-10 LAB — URINALYSIS, ROUTINE W REFLEX MICROSCOPIC
Bilirubin Urine: NEGATIVE
Glucose, UA: NEGATIVE mg/dL
Hgb urine dipstick: NEGATIVE
Ketones, ur: NEGATIVE mg/dL
Leukocytes,Ua: NEGATIVE
Nitrite: NEGATIVE
Protein, ur: 30 mg/dL — AB
Specific Gravity, Urine: >=1.03 (ref 1.005–1.030)
pH: 5.5 (ref 5.0–8.0)

## 2022-12-10 LAB — COMPREHENSIVE METABOLIC PANEL
ALT: 21 U/L (ref 0–44)
AST: 19 U/L (ref 15–41)
Albumin: 4.2 g/dL (ref 3.5–5.0)
Alkaline Phosphatase: 111 U/L (ref 38–126)
Anion gap: 8 (ref 5–15)
BUN: 19 mg/dL (ref 8–23)
CO2: 26 mmol/L (ref 22–32)
Calcium: 8.8 mg/dL — ABNORMAL LOW (ref 8.9–10.3)
Chloride: 104 mmol/L (ref 98–111)
Creatinine, Ser: 0.77 mg/dL (ref 0.44–1.00)
GFR, Estimated: >60 mL/min (ref >60)
Glucose, Bld: 150 mg/dL — ABNORMAL HIGH (ref 70–99)
Potassium: 3.8 mmol/L (ref 3.5–5.1)
Sodium: 138 mmol/L (ref 135–145)
Total Bilirubin: 0.5 mg/dL (ref 0.3–1.2)
Total Protein: 7.6 g/dL (ref 6.5–8.1)

## 2022-12-10 LAB — URINALYSIS, MICROSCOPIC (REFLEX)

## 2022-12-10 MED ORDER — SODIUM CHLORIDE 0.9 % IV SOLN
1.0000 g | INTRAVENOUS | Status: AC
  Administered 2022-12-10: 1 g via INTRAVENOUS
  Filled 2022-12-10: qty 10

## 2022-12-10 MED ORDER — SODIUM CHLORIDE 0.9 % IV SOLN
25.0000 mg | INTRAVENOUS | Status: DC | PRN
  Filled 2022-12-10: qty 1

## 2022-12-10 MED ORDER — FENTANYL CITRATE PF 50 MCG/ML IJ SOSY
50.0000 ug | PREFILLED_SYRINGE | Freq: Once | INTRAMUSCULAR | Status: AC
  Administered 2022-12-10: 50 ug via INTRAVENOUS
  Filled 2022-12-10: qty 1

## 2022-12-10 MED ORDER — ONDANSETRON 4 MG PO TBDP: 4.0000 mg | ORAL_TABLET | Freq: Four times a day (QID) | ORAL | Status: DC | PRN

## 2022-12-10 MED ORDER — DIPHENHYDRAMINE HCL 50 MG/ML IJ SOLN: 12.5000 mg | Freq: Four times a day (QID) | INTRAMUSCULAR | Status: DC | PRN

## 2022-12-10 MED ORDER — IOHEXOL 300 MG/ML  SOLN
80.0000 mL | Freq: Once | INTRAMUSCULAR | Status: AC | PRN
  Administered 2022-12-10: 80 mL via INTRAVENOUS

## 2022-12-10 MED ORDER — HYDRALAZINE HCL 20 MG/ML IJ SOLN: 10.0000 mg | INTRAMUSCULAR | Status: DC | PRN

## 2022-12-10 MED ORDER — DIPHENHYDRAMINE HCL 12.5 MG/5ML PO ELIX: 12.5000 mg | ORAL_SOLUTION | Freq: Four times a day (QID) | ORAL | Status: DC | PRN

## 2022-12-10 MED ORDER — PROMETHAZINE HCL 25 MG/ML IJ SOLN: 25.0000 mg | Freq: Four times a day (QID) | INTRAMUSCULAR | Status: DC | PRN

## 2022-12-10 MED ORDER — ENOXAPARIN SODIUM 40 MG/0.4ML IJ SOSY
40.0000 mg | PREFILLED_SYRINGE | INTRAMUSCULAR | Status: DC
  Administered 2022-12-10 – 2022-12-12 (×3): 40 mg via SUBCUTANEOUS
  Filled 2022-12-10 (×4): qty 0.4

## 2022-12-10 MED ORDER — LORAZEPAM 2 MG/ML IJ SOLN
1.0000 mg | Freq: Once | INTRAMUSCULAR | Status: AC
  Administered 2022-12-10: 1 mg via INTRAVENOUS
  Filled 2022-12-10: qty 1

## 2022-12-10 MED ORDER — POTASSIUM CHLORIDE IN NACL 20-0.9 MEQ/L-% IV SOLN
INTRAVENOUS | Status: DC
  Filled 2022-12-10 (×5): qty 1000

## 2022-12-10 MED ORDER — DILTIAZEM HCL ER COATED BEADS 240 MG PO CP24
240.0000 mg | ORAL_CAPSULE | Freq: Every day | ORAL | Status: DC
  Administered 2022-12-11 – 2022-12-12 (×2): 240 mg via ORAL
  Filled 2022-12-10 (×2): qty 1

## 2022-12-10 MED ORDER — FLECAINIDE ACETATE 50 MG PO TABS
100.0000 mg | ORAL_TABLET | Freq: Two times a day (BID) | ORAL | Status: DC
  Administered 2022-12-11 – 2022-12-12 (×3): 100 mg via ORAL
  Filled 2022-12-10 (×3): qty 2

## 2022-12-10 MED ORDER — SODIUM CHLORIDE 0.9 % IV SOLN
25.0000 mg | INTRAVENOUS | Status: DC | PRN
  Administered 2022-12-10: 25 mg via INTRAVENOUS
  Filled 2022-12-10 (×3): qty 1

## 2022-12-10 MED ORDER — MORPHINE SULFATE (PF) 4 MG/ML IV SOLN
4.0000 mg | INTRAVENOUS | Status: DC | PRN
  Administered 2022-12-10 (×2): 4 mg via INTRAVENOUS
  Filled 2022-12-10 (×2): qty 1

## 2022-12-10 MED ORDER — LEVOTHYROXINE SODIUM 25 MCG PO TABS
137.0000 ug | ORAL_TABLET | Freq: Every day | ORAL | Status: DC
  Administered 2022-12-12: 137 ug via ORAL
  Filled 2022-12-10 (×2): qty 1

## 2022-12-10 MED ORDER — ONDANSETRON HCL 4 MG/2ML IJ SOLN
4.0000 mg | Freq: Four times a day (QID) | INTRAMUSCULAR | Status: DC | PRN
  Administered 2022-12-10: 4 mg via INTRAVENOUS
  Filled 2022-12-10: qty 2

## 2022-12-10 MED ORDER — SIMETHICONE 80 MG PO CHEW: 40.0000 mg | CHEWABLE_TABLET | Freq: Four times a day (QID) | ORAL | Status: DC | PRN

## 2022-12-10 MED ORDER — SODIUM CHLORIDE 0.9 % IV SOLN: INTRAVENOUS | Status: AC

## 2022-12-10 MED ORDER — PANTOPRAZOLE SODIUM 40 MG IV SOLR
40.0000 mg | Freq: Every day | INTRAVENOUS | Status: DC
  Administered 2022-12-10 – 2022-12-11 (×2): 40 mg via INTRAVENOUS
  Filled 2022-12-10 (×2): qty 10

## 2022-12-10 NOTE — Progress Notes (Signed)
  Transition of Care [facility]) Screening Note   Patient Details  Name: MIKELA SENN Date of Birth: Jun 03, 1961   Transition [facility]) CM/SW Contact:    Henrietta Dine, RN Phone Number: 12/10/2022, 12:47 PM    Transition of [facility]) has reviewed patient and no TOC needs have been identified at this time. We will continue to monitor patient advancement through interdisciplinary progression rounds. If new patient transition needs arise, please place a TOC consult.

## 2022-12-10 NOTE — H&P (Signed)
Alexandria [name] is [name] 62 y.o. female.   Chief Complaint: Abdominal distention/ nausea HPI: S/p robotic hiatal hernia repair with Toupet fundoplication by Dr. Rosendo Gros 10/10/22.  She was seen by Dr. Rosendo Gros last week and was released to resume a regular diet.  Last night, she had a crab cake for dinner.  Shortly after that, she had acute onset of epigastric abdominal pain and nausea.  She presented to [facility] for evaluation.  The CT scan questioned possible partial SBO in the RLQ, but the patient reports a bowel movement and flatus last night.  She is quite miserable with distention and nausea, but is unable to vomit.  Past Medical History:  Diagnosis Date   Anemia    hx of iron deficiency anemia (pt had iron infusions)   Anxiety    Atrial fibrillation (HCC)    Depression    GERD (gastroesophageal reflux disease)    Graves disease    Hiatal hernia    a. seen on CT 12/2016.   Hyperlipidemia    Hypertension    Hypothyroidism    pt had radioiodine treatment on thyroid   Low iron    pt received iron infusions 08/2022   NSVT (nonsustained ventricular tachycardia) (HCC)    Pulmonary nodules    a. seen on CT 12/2016.   Stroke [facility])    TIA (transient ischemic attack) 01/2017   a. tx at [facility].    Past Surgical History:  Procedure Laterality Date   ATRIAL FIBRILLATION ABLATION N/A 09/12/2018   Procedure: ATRIAL FIBRILLATION ABLATION;  Surgeon: Thompson Grayer, MD;  Location: [place] [facility];  Service: Cardiovascular;  Laterality: N/A;   BREAST LUMPECTOMY WITH RADIOACTIVE SEED LOCALIZATION Right 06/28/2022   Procedure: RIGHT BREAST LUMPECTOMY WITH RADIOACTIVE SEED LOCALIZATION;  Surgeon: Erroll Luna, MD;  Location: [place];  Service: General;  Laterality: Right;   CARPAL TUNNEL RELEASE Right 1991   [place] OF UTERUS  2018   INSERTION OF MESH N/A 10/10/2022   Procedure: INSERTION OF MESH;  Surgeon: Ralene Ok, MD;  Location: [place];  Service: General;  Laterality: N/A;    THYROID SURGERY  2012   radioactive iodine swallowed   WISDOM TOOTH EXTRACTION  1980   XI ROBOTIC ASSISTED HIATAL HERNIA REPAIR N/A 10/10/2022   Procedure: XI ROBOTIC ASSISTED HIATAL HERNIA REPAIR WITH MESH AND FUNDOPLICATION;  Surgeon: Ralene Ok, MD;  Location: [place];  Service: General;  Laterality: N/A;    Family History  Problem Relation Age of Onset   Hyperlipidemia Mother    Dementia Mother    Coronary artery disease Father    Atrial fibrillation Father    Skin cancer Father    Colon polyps Brother        says he goes every year to have a colonoscopy   Diabetes Maternal Grandmother    Coronary artery disease Paternal Grandfather    Coronary artery disease Paternal Uncle    Colon cancer Neg Hx    Esophageal cancer Neg Hx    Social History:  reports that she has never smoked. She has never used smokeless tobacco. She reports current alcohol use of about 1.0 standard drink of alcohol per week. She reports that she does not use drugs.  Allergies:  Allergies  Allergen Reactions   Peanut-Containing Drug Products Swelling    Medications Prior to Admission  Medication Sig Dispense Refill   acetaminophen (TYLENOL) 500 MG tablet Take 500 mg by mouth every 6 (six) hours as needed for moderate  pain or headache.     ALPRAZolam (XANAX) 0.5 MG tablet TAKE 1/2 TO 1 TABLET BY MOUTH TWICE A DAY AS NEEDED (Patient taking differently: Take 0.125-0.25 mg by mouth 2 (two) times daily as needed for anxiety.) 30 tablet 1   atorvastatin (LIPITOR) 40 MG tablet TAKE 1 TABLET BY MOUTH EVERY DAY IN THE EVENING 90 tablet 3   diltiazem (CARDIZEM CD) 240 MG 24 hr capsule TAKE 1 CAPSULE BY MOUTH EVERY DAY 90 capsule 2   ELIQUIS 5 MG TABS tablet TAKE 1 TABLET BY MOUTH TWICE A DAY 60 tablet 6   famotidine (PEPCID) 40 MG tablet TAKE 1 TABLET BY MOUTH EVERYDAY AT BEDTIME 90 tablet 2   flecainide (TAMBOCOR) 100 MG tablet Take 1 tablet (100 mg total) by mouth 2 (two) times daily. 180 tablet 3   Iron,  Ferrous Sulfate, 325 (65 Fe) MG TABS Take 325 mg by mouth daily. 30 tablet 5   levothyroxine (SYNTHROID) 137 MCG tablet Take 137 mcg by mouth daily before breakfast.     pantoprazole (PROTONIX) 40 MG tablet TAKE 1 TABLET BY MOUTH TWICE A DAY 180 tablet 1    Results for orders placed or performed during the hospital encounter of 12/09/22 (from the past 48 hour(s))  Urinalysis, Routine w reflex microscopic -Urine, Clean Catch     Status: Abnormal   Collection Time: 12/09/22 11:46 PM  Result Value Ref Range   Color, Urine YELLOW YELLOW   APPearance CLEAR CLEAR   Specific Gravity, Urine >=1.030 1.005 - 1.030   pH 5.5 5.0 - 8.0   Glucose, UA NEGATIVE NEGATIVE mg/dL   Hgb urine dipstick NEGATIVE NEGATIVE   Bilirubin Urine NEGATIVE NEGATIVE   Ketones, ur NEGATIVE NEGATIVE mg/dL   Protein, ur 30 (A) NEGATIVE mg/dL   Nitrite NEGATIVE NEGATIVE   Leukocytes,Ua NEGATIVE NEGATIVE    Comment: Performed at [facility], [address] [place], [place] [address]  Urinalysis, Microscopic (reflex)     Status: Abnormal   Collection Time: 12/09/22 11:46 PM  Result Value Ref Range   RBC / HPF 0-5 0 - 5 RBC/hpf   WBC, UA 0-5 0 - 5 WBC/hpf   Bacteria, UA MANY (A) NONE SEEN   Squamous Epithelial / HPF 0-5 0 - 5 /HPF   Mucus PRESENT    Hyaline Casts, UA PRESENT     Comment: Performed at [facility], [address] [place], [place] [address]  CBC with Differential     Status: Abnormal   Collection Time: 12/09/22 11:47 PM  Result Value Ref Range   WBC 15.2 (H) 4.0 - 10.5 K/uL   RBC 4.83 3.87 - 5.11 MIL/uL   Hemoglobin 14.4 12.0 - 15.0 g/dL   HCT 42.9 36.0 - 46.0 %   MCV 88.8 80.0 - 100.0 fL   MCH 29.8 26.0 - 34.0 pg   MCHC 33.6 30.0 - 36.0 g/dL   RDW 14.8 11.5 - 15.5 %   Platelets 272 150 - 400 K/uL   nRBC 0.0 0.0 - 0.2 %   Neutrophils Relative % 87 %   Neutro Abs 13.3 (H) 1.7 - 7.7 K/uL   Lymphocytes Relative 7 %   Lymphs Abs 1.0 0.7 - 4.0 K/uL   Monocytes Relative 4 %    Monocytes Absolute 0.7 0.1 - 1.0 K/uL   Eosinophils Relative 1 %   Eosinophils Absolute 0.1 0.0 - 0.5 K/uL   Basophils Relative 0 %   Basophils Absolute 0.1  0.0 - 0.1 K/uL   Immature Granulocytes 1 %   Abs Immature Granulocytes 0.09 (H) 0.00 - 0.07 K/uL    Comment: Performed at [facility], [address] [place], [place] [address]  Comprehensive metabolic panel     Status: Abnormal   Collection Time: 12/09/22 11:47 PM  Result Value Ref Range   Sodium 138 135 - 145 mmol/L   Potassium 3.8 3.5 - 5.1 mmol/L   Chloride 104 98 - 111 mmol/L   CO2 26 22 - 32 mmol/L   Glucose, Bld 150 (H) 70 - 99 mg/dL    Comment: Glucose reference range applies only to samples taken after fasting for at least 8 hours.   BUN 19 8 - 23 mg/dL   Creatinine, Ser 0.77 0.44 - 1.00 mg/dL   Calcium 8.8 (L) 8.9 - 10.3 mg/dL   Total Protein 7.6 6.5 - 8.1 g/dL   Albumin 4.2 3.5 - 5.0 g/dL   AST 19 15 - 41 U/L   ALT 21 0 - 44 U/L   Alkaline Phosphatase 111 38 - 126 U/L   Total Bilirubin 0.5 0.3 - 1.2 mg/dL   GFR, Estimated >60 >60 mL/min    Comment: (NOTE) Calculated using the CKD-EPI Creatinine Equation (2021)    Anion gap 8 5 - 15    Comment: Performed at [facility], [address] [place], [place] [address]   CT ABDOMEN PELVIS W CONTRAST  Result Date: 12/10/2022 CLINICAL DATA:  Abdominal pain, acute, nonlocalized. Pt here from home for sudden onset upper abd pain. Pt reports having hiatal hernia surgery 10/10/22, and was just cleared last week to return to regular diet. Pt endorses nausea, but no vomiting. EXAM: CT ABDOMEN AND PELVIS WITH CONTRAST TECHNIQUE: Multidetector CT imaging of the abdomen and pelvis was performed using the standard protocol following bolus administration of intravenous contrast. RADIATION DOSE REDUCTION: This exam was performed according to the departmental dose-optimization program which includes automated exposure control, adjustment of the mA and/or kV  according to patient size and/or use of iterative reconstruction technique. CONTRAST:  43m OMNIPAQUE IOHEXOL 300 MG/ML  SOLN COMPARISON:  None Available. FINDINGS: Lower chest: No acute abnormality. Hepatobiliary: No focal liver abnormality. No gallstones, gallbladder wall thickening, or pericholecystic fluid. No biliary dilatation. Pancreas: No focal lesion. Normal pancreatic contour. No surrounding inflammatory changes. No main pancreatic ductal dilatation. Spleen: Normal in size without focal abnormality.  Splenule noted. Adrenals/Urinary Tract: No adrenal nodule bilaterally. Bilateral kidneys enhance symmetrically. Subcentimeter hypodensity within the left kidney too small to characterize-no further follow-up indicated. No hydronephrosis. No hydroureter. The urinary bladder is unremarkable. Stomach/Bowel: Fundoplication surgical changes noted. Stomach is within normal limits. Fluid dilatation of the majority of the small bowel with query possible transition point within the right lower abdomen (5:64). Small bowel caliber measuring up to 3.8 cm. Appendix appears normal. Vascular/Lymphatic: No abdominal aorta or iliac aneurysm. Moderate atherosclerotic plaque of the aorta and its branches. No abdominal, pelvic, or inguinal lymphadenopathy. Reproductive: Calcified and noncalcified intramural uterine fibroid. Otherwise uterus and bilateral adnexa are unremarkable. Other: Trace free fluid within the pelvis. No intraperitoneal free gas. No organized fluid collection. Musculoskeletal: Tiny fat containing umbilical hernia No suspicious lytic or blastic osseous lesions. No acute displaced fracture. L3 vertebral body hemangioma. L5-S1 intervertebral disc space vacuum phenomenon. IMPRESSION: 1. Question developing early versus partial small bowel obstruction with query possible transition point within the right lower abdomen. Associated irregular bowel wall thickening at the transition  point the could represent a true  lesion/stricture versus peristalsis. Differential diagnosis includes ileus. Recommend attention on follow-up. 2. Calcified and noncalcified intramural uterine fibroid. 3. Skin Aortic Atherosclerosis (ICD10-I70.0). Electronically Signed   By: Iven Finn M.D.   On: 12/10/2022 00:49    Review of Systems  HENT:  Negative for ear discharge, ear pain, hearing loss and tinnitus.   Eyes:  Negative for photophobia and pain.  Respiratory:  Negative for cough and shortness of breath.   Cardiovascular:  Negative for chest pain.  Gastrointestinal:  Positive for abdominal distention, abdominal pain and nausea. Negative for vomiting.  Genitourinary:  Negative for dysuria, flank pain, frequency and urgency.  Musculoskeletal:  Negative for back pain, myalgias and neck pain.  Neurological:  Negative for dizziness and headaches.  Hematological:  Does not bruise/bleed easily.  Psychiatric/Behavioral:  The patient is not nervous/anxious.     Blood pressure (!) 148/79, pulse 60, temperature (!) 97.5 F (36.4 C), temperature source Oral, resp. rate 16, height '5\' 7"'$  (1.702 m), weight 75.8 kg, last menstrual period 01/26/2012, SpO2 99 %. Physical Exam  Constitutional:  WDWN in NAD, conversant, no obvious deformities; uncomfortable with persistent retching Eyes:  Pupils equal, round; sclera anicteric; moist conjunctiva; no lid lag HENT:  Oral mucosa moist; good dentition  Neck:  No masses palpated, trachea midline; no thyromegaly Lungs:  CTA bilaterally; normal respiratory effort CV:  Regular rate and rhythm; no murmurs; extremities well-perfused with no edema Abd:  distended, epigastric tenderness without peritonitis; incisions well-healed Musc:  Normal gait; no apparent clubbing or cyanosis in extremities Lymphatic:  No palpable cervical or axillary lymphadenopathy Skin:  Warm, dry; no sign of jaundice Psychiatric - alert and oriented x 4; calm mood and affect  Assessment/Plan S/p hiatal hernia repair/  Toupet fundoplication Partial SBO vs. Ileus - not definitive on CT scan Stomach appears distended on CT scan  Admit for IV hydration, symptom control NG tube placement - to ILWS Protonix IV May consider contrast study to evaluate wrap after NG decompression.  Maia Petties, MD 12/10/2022, 3:00 AM

## 2022-12-10 NOTE — ED Provider Notes (Signed)
[place] [facility] AT [facility] Provider Note   CSN: [id number] Arrival date & time: 12/09/22  2331     History  Chief Complaint  Patient presents with   Abdominal Pain    Alexandria [name] is a 62 y.o. female.  The history is provided by the patient.  Abdominal Pain Pain location:  Epigastric Pain quality: bloating   Pain radiates to:  Does not radiate Pain severity:  Severe Onset quality:  Sudden Timing:  Constant Progression:  Unchanged Chronicity:  New Context: eating   Relieved by:  Nothing Worsened by:  Nothing Ineffective treatments:  Flatus Associated symptoms: no chest pain, no cough, no fever and no vomiting   Risk factors comment:  Nissen 10/11/23      Home Medications Prior to Admission medications   Medication Sig Start Date End Date Taking? Authorizing Provider  acetaminophen (TYLENOL) 500 MG tablet Take 500 mg by mouth every 6 (six) hours as needed for moderate pain or headache.    [provider]  ALPRAZolam (XANAX) 0.5 MG tablet TAKE 1/2 TO 1 TABLET BY MOUTH TWICE A DAY AS NEEDED Patient taking differently: Take 0.125-0.25 mg by mouth 2 (two) times daily as needed for anxiety. 04/18/22   Roma Schanz R, DO  atorvastatin (LIPITOR) 40 MG tablet TAKE 1 TABLET BY MOUTH EVERY DAY IN THE EVENING 02/03/22   Carollee Herter, Alferd Apa, DO  diltiazem (CARDIZEM CD) 240 MG 24 hr capsule TAKE 1 CAPSULE BY MOUTH EVERY DAY 06/09/22   Allred, Jeneen Rinks, MD  diltiazem (CARDIZEM) 30 MG tablet TAKE 1 TABLET (30 MG TOTAL) BY MOUTH DAILY AS NEEDED. FOR PALPITATIONS/HEART RATE ABOVE 110BPM 08/08/22 08/08/23  Allred, Jeneen Rinks, MD  ELIQUIS 5 MG TABS tablet TAKE 1 TABLET BY MOUTH TWICE A DAY 10/02/22   Allred, Jeneen Rinks, MD  famotidine (PEPCID) 40 MG tablet TAKE 1 TABLET BY MOUTH EVERYDAY AT BEDTIME 08/21/22   Jackquline Denmark, MD  flecainide (TAMBOCOR) 100 MG tablet Take 1 tablet (100 mg total) by mouth 2 (two) times daily. 02/17/22   Allred, Jeneen Rinks, MD  Iron,  Ferrous Sulfate, 325 (65 Fe) MG TABS Take 325 mg by mouth daily. 04/18/22   [name], DO  levothyroxine (SYNTHROID) 137 MCG tablet Take 137 mcg by mouth daily before breakfast.    [provider]  pantoprazole (PROTONIX) 40 MG tablet TAKE 1 TABLET BY MOUTH TWICE A DAY 08/21/22   Jackquline Denmark, MD      Allergies    Peanut-containing drug products    Review of Systems   Review of Systems  Constitutional:  Negative for fever.  HENT:  Negative for facial swelling.   Eyes:  Negative for redness.  Respiratory:  Negative for cough.   Cardiovascular:  Negative for chest pain.  Gastrointestinal:  Positive for abdominal distention and abdominal pain. Negative for vomiting.  All other systems reviewed and are negative.   Physical Exam Updated Vital Signs BP (!) 155/87   Pulse 73   Temp 98.7 F (37.1 C) (Oral)   Resp 20   Ht '5\' 7"'$  (1.702 m)   Wt 75.8 kg   LMP 01/26/2012   SpO2 95%   BMI 26.16 kg/m  Physical Exam Vitals and nursing note reviewed. Exam conducted with a chaperone present.  Constitutional:      General: She is not in acute distress.    Appearance: She is well-developed.  HENT:     Head: Normocephalic and atraumatic.  Nose: Nose normal.  Eyes:     Pupils: Pupils are equal, round, and reactive to light.  Cardiovascular:     Rate and Rhythm: Normal rate and regular rhythm.     Pulses: Normal pulses.     Heart sounds: Normal heart sounds.  Pulmonary:     Effort: Pulmonary effort is normal. No respiratory distress.     Breath sounds: Normal breath sounds.  Abdominal:     General: Bowel sounds are normal. There is distension.     Palpations: Abdomen is soft.     Tenderness: There is no abdominal tenderness. There is no guarding or rebound.  Genitourinary:    Vagina: No vaginal discharge.  Musculoskeletal:        General: Normal range of motion.     Cervical back: Neck supple.  Skin:    General: Skin is warm and dry.     Capillary Refill:  Capillary refill takes less than 2 seconds.     Findings: No erythema or rash.  Neurological:     General: No focal deficit present.     Mental Status: She is oriented to person, place, and time.     Deep Tendon Reflexes: Reflexes normal.  Psychiatric:        Mood and Affect: Mood normal.        Behavior: Behavior normal.     ED Results / Procedures / Treatments   Labs (all labs ordered are listed, but only abnormal results are displayed) Results for orders placed or performed during the hospital encounter of 12/09/22  CBC with Differential  Result Value Ref Range   WBC 15.2 (H) 4.0 - 10.5 K/uL   RBC 4.83 3.87 - 5.11 MIL/uL   Hemoglobin 14.4 12.0 - 15.0 g/dL   HCT 42.9 36.0 - 46.0 %   MCV 88.8 80.0 - 100.0 fL   MCH 29.8 26.0 - 34.0 pg   MCHC 33.6 30.0 - 36.0 g/dL   RDW 14.8 11.5 - 15.5 %   Platelets 272 150 - 400 K/uL   nRBC 0.0 0.0 - 0.2 %   Neutrophils Relative % 87 %   Neutro Abs 13.3 (H) 1.7 - 7.7 K/uL   Lymphocytes Relative 7 %   Lymphs Abs 1.0 0.7 - 4.0 K/uL   Monocytes Relative 4 %   Monocytes Absolute 0.7 0.1 - 1.0 K/uL   Eosinophils Relative 1 %   Eosinophils Absolute 0.1 0.0 - 0.5 K/uL   Basophils Relative 0 %   Basophils Absolute 0.1 0.0 - 0.1 K/uL   Immature Granulocytes 1 %   Abs Immature Granulocytes 0.09 (H) 0.00 - 0.07 K/uL  Comprehensive metabolic panel  Result Value Ref Range   Sodium 138 135 - 145 mmol/L   Potassium 3.8 3.5 - 5.1 mmol/L   Chloride 104 98 - 111 mmol/L   CO2 26 22 - 32 mmol/L   Glucose, Bld 150 (H) 70 - 99 mg/dL   BUN 19 8 - 23 mg/dL   Creatinine, Ser 0.77 0.44 - 1.00 mg/dL   Calcium 8.8 (L) 8.9 - 10.3 mg/dL   Total Protein 7.6 6.5 - 8.1 g/dL   Albumin 4.2 3.5 - 5.0 g/dL   AST 19 15 - 41 U/L   ALT 21 0 - 44 U/L   Alkaline Phosphatase 111 38 - 126 U/L   Total Bilirubin 0.5 0.3 - 1.2 mg/dL   GFR, Estimated >60 >60 mL/min   Anion gap 8 5 - 15  Urinalysis, Routine w reflex  microscopic -Urine, Clean Catch  Result Value Ref Range    Color, Urine YELLOW YELLOW   APPearance CLEAR CLEAR   Specific Gravity, Urine >=1.030 1.005 - 1.030   pH 5.5 5.0 - 8.0   Glucose, UA NEGATIVE NEGATIVE mg/dL   Hgb urine dipstick NEGATIVE NEGATIVE   Bilirubin Urine NEGATIVE NEGATIVE   Ketones, ur NEGATIVE NEGATIVE mg/dL   Protein, ur 30 (A) NEGATIVE mg/dL   Nitrite NEGATIVE NEGATIVE   Leukocytes,Ua NEGATIVE NEGATIVE  Urinalysis, Microscopic (reflex)  Result Value Ref Range   RBC / HPF 0-5 0 - 5 RBC/hpf   WBC, UA 0-5 0 - 5 WBC/hpf   Bacteria, UA MANY (A) NONE SEEN   Squamous Epithelial / HPF 0-5 0 - 5 /HPF   Mucus PRESENT    Hyaline Casts, UA PRESENT    CT ABDOMEN PELVIS W CONTRAST  Result Date: 12/10/2022 CLINICAL DATA:  Abdominal pain, acute, nonlocalized. Pt here from home for sudden onset upper abd pain. Pt reports having hiatal hernia surgery 10/10/22, and was just cleared last week to return to regular diet. Pt endorses nausea, but no vomiting. EXAM: CT ABDOMEN AND PELVIS WITH CONTRAST TECHNIQUE: Multidetector CT imaging of the abdomen and pelvis was performed using the standard protocol following bolus administration of intravenous contrast. RADIATION DOSE REDUCTION: This exam was performed according to the departmental dose-optimization program which includes automated exposure control, adjustment of the mA and/or kV according to patient size and/or use of iterative reconstruction technique. CONTRAST:  34m OMNIPAQUE IOHEXOL 300 MG/ML  SOLN COMPARISON:  None Available. FINDINGS: Lower chest: No acute abnormality. Hepatobiliary: No focal liver abnormality. No gallstones, gallbladder wall thickening, or pericholecystic fluid. No biliary dilatation. Pancreas: No focal lesion. Normal pancreatic contour. No surrounding inflammatory changes. No main pancreatic ductal dilatation. Spleen: Normal in size without focal abnormality.  Splenule noted. Adrenals/Urinary Tract: No adrenal nodule bilaterally. Bilateral kidneys enhance symmetrically.  Subcentimeter hypodensity within the left kidney too small to characterize-no further follow-up indicated. No hydronephrosis. No hydroureter. The urinary bladder is unremarkable. Stomach/Bowel: Fundoplication surgical changes noted. Stomach is within normal limits. Fluid dilatation of the majority of the small bowel with query possible transition point within the right lower abdomen (5:64). Small bowel caliber measuring up to 3.8 cm. Appendix appears normal. Vascular/Lymphatic: No abdominal aorta or iliac aneurysm. Moderate atherosclerotic plaque of the aorta and its branches. No abdominal, pelvic, or inguinal lymphadenopathy. Reproductive: Calcified and noncalcified intramural uterine fibroid. Otherwise uterus and bilateral adnexa are unremarkable. Other: Trace free fluid within the pelvis. No intraperitoneal free gas. No organized fluid collection. Musculoskeletal: Tiny fat containing umbilical hernia No suspicious lytic or blastic osseous lesions. No acute displaced fracture. L3 vertebral body hemangioma. L5-S1 intervertebral disc space vacuum phenomenon. IMPRESSION: 1. Question developing early versus partial small bowel obstruction with query possible transition point within the right lower abdomen. Associated irregular bowel wall thickening at the transition point the could represent a true lesion/stricture versus peristalsis. Differential diagnosis includes ileus. Recommend attention on follow-up. 2. Calcified and noncalcified intramural uterine fibroid. 3. Skin Aortic Atherosclerosis (ICD10-I70.0). Electronically Signed   By: MIven FinnM.D.   On: 12/10/2022 00:49    Radiology CT ABDOMEN PELVIS W CONTRAST  Result Date: 12/10/2022 CLINICAL DATA:  Abdominal pain, acute, nonlocalized. Pt here from home for sudden onset upper abd pain. Pt reports having hiatal hernia surgery 10/10/22, and was just cleared last week to return to regular diet. Pt endorses nausea, but no vomiting. EXAM: CT ABDOMEN AND  PELVIS WITH  CONTRAST TECHNIQUE: Multidetector CT imaging of the abdomen and pelvis was performed using the standard protocol following bolus administration of intravenous contrast. RADIATION DOSE REDUCTION: This exam was performed according to the departmental dose-optimization program which includes automated exposure control, adjustment of the mA and/or kV according to patient size and/or use of iterative reconstruction technique. CONTRAST:  35m OMNIPAQUE IOHEXOL 300 MG/ML  SOLN COMPARISON:  None Available. FINDINGS: Lower chest: No acute abnormality. Hepatobiliary: No focal liver abnormality. No gallstones, gallbladder wall thickening, or pericholecystic fluid. No biliary dilatation. Pancreas: No focal lesion. Normal pancreatic contour. No surrounding inflammatory changes. No main pancreatic ductal dilatation. Spleen: Normal in size without focal abnormality.  Splenule noted. Adrenals/Urinary Tract: No adrenal nodule bilaterally. Bilateral kidneys enhance symmetrically. Subcentimeter hypodensity within the left kidney too small to characterize-no further follow-up indicated. No hydronephrosis. No hydroureter. The urinary bladder is unremarkable. Stomach/Bowel: Fundoplication surgical changes noted. Stomach is within normal limits. Fluid dilatation of the majority of the small bowel with query possible transition point within the right lower abdomen (5:64). Small bowel caliber measuring up to 3.8 cm. Appendix appears normal. Vascular/Lymphatic: No abdominal aorta or iliac aneurysm. Moderate atherosclerotic plaque of the aorta and its branches. No abdominal, pelvic, or inguinal lymphadenopathy. Reproductive: Calcified and noncalcified intramural uterine fibroid. Otherwise uterus and bilateral adnexa are unremarkable. Other: Trace free fluid within the pelvis. No intraperitoneal free gas. No organized fluid collection. Musculoskeletal: Tiny fat containing umbilical hernia No suspicious lytic or blastic osseous  lesions. No acute displaced fracture. L3 vertebral body hemangioma. L5-S1 intervertebral disc space vacuum phenomenon. IMPRESSION: 1. Question developing early versus partial small bowel obstruction with query possible transition point within the right lower abdomen. Associated irregular bowel wall thickening at the transition point the could represent a true lesion/stricture versus peristalsis. Differential diagnosis includes ileus. Recommend attention on follow-up. 2. Calcified and noncalcified intramural uterine fibroid. 3. Skin Aortic Atherosclerosis (ICD10-I70.0). Electronically Signed   By: MIven FinnM.D.   On: 12/10/2022 00:49    Procedures Procedures    Medications Ordered in ED Medications  famotidine (PEPCID) IVPB 20 mg premix (20 mg Intravenous Not Given 12/09/22 2356)  fentaNYL (SUBLIMAZE) injection 50 mcg (has no administration in time range)  0.9 %  sodium chloride infusion (has no administration in time range)  ondansetron (ZOFRAN) injection 4 mg (4 mg Intravenous Given 12/09/22 2347)  alum & mag hydroxide-simeth (MAALOX/MYLANTA) 200-200-20 MG/5ML suspension 30 mL (30 mLs Oral Given 12/09/22 2346)  dicyclomine (BENTYL) injection 20 mg (20 mg Intramuscular Given 12/09/22 2356)  0.9 %  sodium chloride infusion ( Intravenous New Bag/Given 12/09/22 2350)  iohexol (OMNIPAQUE) 300 MG/ML solution 80 mL (80 mLs Intravenous Contrast Given 12/10/22 0030)    ED Course/ Medical Decision Making/ A&P                             Medical Decision Making Patient with epigfastric pain and bloating and gassiness.  Ate crab cakes and crab dip today.    Amount and/or Complexity of Data Reviewed External Data Reviewed: labs and notes.    Details: Previous admission and OR notes and labs reviewed in epic  Labs: ordered.    Details: All labs reviewed: urine is consistent with cystitis. White count elevated 15.2, hemoglobin normal 14.4 normal platelets 272.  Normal sodium 138, normal potassium 3.8,  normal creatinine .737 Radiology: ordered and independent interpretation performed.    Details: SBO on CT by me  Discussion  of management or test interpretation with external provider(s): Case d/w Dr. Georgette Dover admit to med surg at [facility] or [facility], will admit to [facility]  Risk OTC drugs. Prescription drug management. Parenteral controlled substances. Decision regarding hospitalization.    Final Clinical Impression(s) / ED Diagnoses Final diagnoses:  SBO (small bowel obstruction) ([place])   The patient appears reasonably stabilized for admission considering the current resources, flow, and capabilities available in the ED at this time, and I doubt any other [facility] requiring further screening and/or treatment in the ED prior to admission.  Rx / DC Orders ED Discharge Orders     None         Tharun Cappella, MD 12/10/22 1027

## 2022-12-10 NOTE — Plan of Care (Signed)

## 2022-12-10 NOTE — ED Notes (Signed)
Patient ambulated to the bathroom without assistance. Patient is now back in bed.

## 2022-12-10 NOTE — ED Notes (Signed)
With CT

## 2022-12-11 ENCOUNTER — Inpatient Hospital Stay (HOSPITAL_COMMUNITY): Payer: BC Managed Care – PPO

## 2022-12-11 LAB — BASIC METABOLIC PANEL
Anion gap: 6 (ref 5–15)
BUN: 11 mg/dL (ref 8–23)
CO2: 25 mmol/L (ref 22–32)
Calcium: 8 mg/dL — ABNORMAL LOW (ref 8.9–10.3)
Chloride: 110 mmol/L (ref 98–111)
Creatinine, Ser: 0.54 mg/dL (ref 0.44–1.00)
GFR, Estimated: >60 mL/min (ref >60)
Glucose, Bld: 101 mg/dL — ABNORMAL HIGH (ref 70–99)
Potassium: 3.7 mmol/L (ref 3.5–5.1)
Sodium: 141 mmol/L (ref 135–145)

## 2022-12-11 LAB — CBC
HCT: 37.2 % (ref 36.0–46.0)
Hemoglobin: 11.7 g/dL — ABNORMAL LOW (ref 12.0–15.0)
MCH: 29.8 pg (ref 26.0–34.0)
MCHC: 31.5 g/dL (ref 30.0–36.0)
MCV: 94.7 fL (ref 80.0–100.0)
Platelets: 188 10*3/uL (ref 150–400)
RBC: 3.93 MIL/uL (ref 3.87–5.11)
RDW: 15.1 % (ref 11.5–15.5)
WBC: 7.8 10*3/uL (ref 4.0–10.5)
nRBC: 0 % (ref 0.0–0.2)

## 2022-12-11 MED ORDER — ACETAMINOPHEN 10 MG/ML IV SOLN
1000.0000 mg | Freq: Four times a day (QID) | INTRAVENOUS | Status: AC | PRN
  Administered 2022-12-11 – 2022-12-12 (×2): 1000 mg via INTRAVENOUS
  Filled 2022-12-11 (×2): qty 100

## 2022-12-11 MED ORDER — IOHEXOL 300 MG/ML  SOLN
150.0000 mL | Freq: Once | INTRAMUSCULAR | Status: AC | PRN
  Administered 2022-12-11: 60 mL via ORAL

## 2022-12-11 MED ORDER — MELATONIN 3 MG PO TABS
3.0000 mg | ORAL_TABLET | Freq: Every day | ORAL | Status: DC
  Administered 2022-12-11: 3 mg via ORAL
  Filled 2022-12-11: qty 1

## 2022-12-11 NOTE — Progress Notes (Signed)
Subjective: CC: Feeling better. Abdominal pain, distension and nausea have resolved. Required IV tylenol for a HA this am but otherwise denies any complaints. NGT output 1.2L since placement. Cannister with brown sediment at bottom of cannister but output on top and in tubing appears thinner now. Passing flatus. Last bm was around symptoms started yesterday.   Objective: Vital signs in last 24 hours: Temp:  [98.2 F (36.8 C)-99.1 F (37.3 C)] 98.8 F (37.1 C) (02/05 0559) Pulse Rate:  [69-83] 72 (02/05 0559) Resp:  [16-18] 16 (02/05 0559) BP: (126-134)/(63-72) 130/66 (02/05 0559) SpO2:  [92 %-96 %] 92 % (02/05 0559) Last BM Date : 12/09/22  Intake/Output from previous day: 02/04 0701 - 02/05 0700 In: 2859.1 [I.V.:2809.1; IV Piggyback:50] Out: 1500 [Urine:300; Emesis/NG output:1200] Intake/Output this shift: No intake/output data recorded.  PE: Gen:  Alert, NAD, pleasant Card:  RRR Pulm:  CTAB, no W/R/R, effort normal Abd: Soft, ND, NT, +BS, NGT in place with output as noted above.  Ext:  No LE edema Psych: A&Ox3   Lab Results:  Recent Labs    12/09/22 2347 12/11/22 0419  WBC 15.2* 7.8  HGB 14.4 11.7*  HCT 42.9 37.2  PLT 272 188   BMET Recent Labs    12/09/22 2347 12/11/22 0419  NA 138 141  K 3.8 3.7  CL 104 110  CO2 26 25  GLUCOSE 150* 101*  BUN 19 11  CREATININE 0.77 0.54  CALCIUM 8.8* 8.0*   PT/INR No results for input(s): "LABPROT", "INR" in the last 72 hours. CMP     Component Value Date/Time   NA 141 12/11/2022 0419   NA 141 01/14/2020 1608   K 3.7 12/11/2022 0419   CL 110 12/11/2022 0419   CO2 25 12/11/2022 0419   GLUCOSE 101 (H) 12/11/2022 0419   BUN 11 12/11/2022 0419   BUN 17 01/14/2020 1608   CREATININE 0.54 12/11/2022 0419   CREATININE 0.86 06/09/2022 1137   CREATININE 0.77 09/16/2021 1456   CALCIUM 8.0 (L) 12/11/2022 0419   PROT 7.6 12/09/2022 2347   ALBUMIN 4.2 12/09/2022 2347   AST 19 12/09/2022 2347   AST 13 (L)  06/09/2022 1137   ALT 21 12/09/2022 2347   ALT 17 06/09/2022 1137   ALKPHOS 111 12/09/2022 2347   BILITOT 0.5 12/09/2022 2347   BILITOT 0.4 06/09/2022 1137   GFRNONAA >60 12/11/2022 0419   GFRNONAA >60 06/09/2022 1137   GFRAA 57 (L) 01/14/2020 1608   Lipase  No results found for: "LIPASE"  Studies/Results: DG Abd Portable 1V  Result Date: 12/10/2022 CLINICAL DATA:  NG tube placement. EXAM: PORTABLE ABDOMEN - 1 VIEW COMPARISON:  CT scan from earlier same day FINDINGS: NG tube is folded in the stomach. Proximal side port is well below the GE junction. Gaseous distention of the stomach persist despite the presence of the NG tube. Distended small bowel loops visualized in the upper abdomen, as better characterized on recent CT scan. IMPRESSION: NG tube is folded in the stomach. Proximal side port is well below the GE junction. Electronically Signed   By: Misty Stanley M.D.   On: 12/10/2022 08:51   CT ABDOMEN PELVIS W CONTRAST  Result Date: 12/10/2022 CLINICAL DATA:  Abdominal pain, acute, nonlocalized. Pt here from home for sudden onset upper abd pain. Pt reports having hiatal hernia surgery 10/10/22, and was just cleared last week to return to regular diet. Pt endorses nausea, but no vomiting. EXAM: CT ABDOMEN AND PELVIS  WITH CONTRAST TECHNIQUE: Multidetector CT imaging of the abdomen and pelvis was performed using the standard protocol following bolus administration of intravenous contrast. RADIATION DOSE REDUCTION: This exam was performed according to the departmental dose-optimization program which includes automated exposure control, adjustment of the mA and/or kV according to patient size and/or use of iterative reconstruction technique. CONTRAST:  68m OMNIPAQUE IOHEXOL 300 MG/ML  SOLN COMPARISON:  None Available. FINDINGS: Lower chest: No acute abnormality. Hepatobiliary: No focal liver abnormality. No gallstones, gallbladder wall thickening, or pericholecystic fluid. No biliary dilatation.  Pancreas: No focal lesion. Normal pancreatic contour. No surrounding inflammatory changes. No main pancreatic ductal dilatation. Spleen: Normal in size without focal abnormality.  Splenule noted. Adrenals/Urinary Tract: No adrenal nodule bilaterally. Bilateral kidneys enhance symmetrically. Subcentimeter hypodensity within the left kidney too small to characterize-no further follow-up indicated. No hydronephrosis. No hydroureter. The urinary bladder is unremarkable. Stomach/Bowel: Fundoplication surgical changes noted. Stomach is within normal limits. Fluid dilatation of the majority of the small bowel with query possible transition point within the right lower abdomen (5:64). Small bowel caliber measuring up to 3.8 cm. Appendix appears normal. Vascular/Lymphatic: No abdominal aorta or iliac aneurysm. Moderate atherosclerotic plaque of the aorta and its branches. No abdominal, pelvic, or inguinal lymphadenopathy. Reproductive: Calcified and noncalcified intramural uterine fibroid. Otherwise uterus and bilateral adnexa are unremarkable. Other: Trace free fluid within the pelvis. No intraperitoneal free gas. No organized fluid collection. Musculoskeletal: Tiny fat containing umbilical hernia No suspicious lytic or blastic osseous lesions. No acute displaced fracture. L3 vertebral body hemangioma. L5-S1 intervertebral disc space vacuum phenomenon. IMPRESSION: 1. Question developing early versus partial small bowel obstruction with query possible transition point within the right lower abdomen. Associated irregular bowel wall thickening at the transition point the could represent a true lesion/stricture versus peristalsis. Differential diagnosis includes ileus. Recommend attention on follow-up. 2. Calcified and noncalcified intramural uterine fibroid. 3. Skin Aortic Atherosclerosis (ICD10-I70.0). Electronically Signed   By: MIven FinnM.D.   On: 12/10/2022 00:49    Anti-infectives: Anti-infectives (From  admission, onward)    Start     Dose/Rate Route Frequency Ordered Stop   12/10/22 0115  cefTRIAXone (ROCEPHIN) 1 g in sodium chloride 0.9 % 100 mL IVPB        1 g 200 mL/hr over 30 Minutes Intravenous STAT 12/10/22 0113 12/10/22 0201        Assessment/Plan SBO Hx robotic hiatal hernia repair with mesh and Toupet fundoplication by Dr. RRosendo Gros12/5/23 - Symptomatically improving. No indication for emergency surgery. Will clamp NGT and get PO UGI to eval wrap. I have reached out to RN to ensure water soluble contrast for UGI is given PO and not via NGT.   - Mobilize, pulm toilet  FEN - NPO, Clamp NGT. IVF while npo VTE - SCDs, Lovenox ID - Rocephin x 1 in ED. None needed currently. Afebrile. WBC normalized at 7.8.   I reviewed nursing notes, last 24 h vitals and pain scores, last 48 h intake and output, last 24 h labs and trends, and last 24 h imaging results.   LOS: 1 day    MJillyn Ledger, PPenn State [facility] LLCSurgery 12/11/2022, 8:39 AM Please see Amion for pager number during day hours 7:00am-4:30pm

## 2022-12-12 MED ORDER — ACETAMINOPHEN 500 MG PO TABS: 1000.0000 mg | ORAL_TABLET | Freq: Four times a day (QID) | ORAL | Status: DC | PRN

## 2022-12-12 NOTE — Progress Notes (Signed)
Pt was discharged home today. Instructions were reviewed with patient, and questions were answered. Pt was taken to main entrance via wheelchair by NT.  

## 2022-12-12 NOTE — Plan of Care (Signed)
  Problem: Education: Goal: Knowledge of General Education information will improve Description: Including pain rating scale, medication(s)/side effects and non-pharmacologic comfort measures 12/12/2022 1508 by Charlyne Petrin, RN Outcome: Completed/Met 12/12/2022 1437 by Charlyne Petrin, RN Outcome: Adequate for Discharge   Problem: Health Behavior/Discharge Planning: Goal: Ability to manage health-related needs will improve 12/12/2022 1508 by Charlyne Petrin, RN Outcome: Completed/Met 12/12/2022 1437 by Charlyne Petrin, RN Outcome: Adequate for Discharge   Problem: Clinical Measurements: Goal: Ability to maintain clinical measurements within normal limits will improve 12/12/2022 1508 by Charlyne Petrin, RN Outcome: Completed/Met 12/12/2022 1437 by Charlyne Petrin, RN Outcome: Adequate for Discharge Goal: Will remain free from infection 12/12/2022 1508 by Charlyne Petrin, RN Outcome: Completed/Met 12/12/2022 1437 by Charlyne Petrin, RN Outcome: Adequate for Discharge Goal: Diagnostic test results will improve 12/12/2022 1508 by Charlyne Petrin, RN Outcome: Completed/Met 12/12/2022 1437 by Charlyne Petrin, RN Outcome: Adequate for Discharge Goal: Respiratory complications will improve 12/12/2022 1508 by Charlyne Petrin, RN Outcome: Completed/Met 12/12/2022 1437 by Charlyne Petrin, RN Outcome: Adequate for Discharge Goal: Cardiovascular complication will be avoided 12/12/2022 1508 by Charlyne Petrin, RN Outcome: Completed/Met 12/12/2022 1437 by Charlyne Petrin, RN Outcome: Adequate for Discharge   Problem: Activity: Goal: Risk for activity intolerance will decrease 12/12/2022 1508 by Charlyne Petrin, RN Outcome: Completed/Met 12/12/2022 1437 by Charlyne Petrin, RN Outcome: Adequate for Discharge   Problem: Nutrition: Goal: Adequate nutrition will be maintained 12/12/2022 1508 by Charlyne Petrin, RN Outcome: Completed/Met 12/12/2022 1437 by Charlyne Petrin, RN Outcome: Adequate for Discharge   Problem: Coping: Goal: Level of anxiety will  decrease 12/12/2022 1508 by Charlyne Petrin, RN Outcome: Completed/Met 12/12/2022 1437 by Charlyne Petrin, RN Outcome: Adequate for Discharge   Problem: Elimination: Goal: Will not experience complications related to bowel motility 12/12/2022 1508 by Charlyne Petrin, RN Outcome: Completed/Met 12/12/2022 1437 by Charlyne Petrin, RN Outcome: Adequate for Discharge Goal: Will not experience complications related to urinary retention 12/12/2022 1508 by Charlyne Petrin, RN Outcome: Completed/Met 12/12/2022 1437 by Charlyne Petrin, RN Outcome: Adequate for Discharge   Problem: Pain Managment: Goal: General experience of comfort will improve 12/12/2022 1508 by Charlyne Petrin, RN Outcome: Completed/Met 12/12/2022 1437 by Charlyne Petrin, RN Outcome: Adequate for Discharge   Problem: Safety: Goal: Ability to remain free from injury will improve 12/12/2022 1508 by Charlyne Petrin, RN Outcome: Completed/Met 12/12/2022 1437 by Charlyne Petrin, RN Outcome: Adequate for Discharge   Problem: Skin Integrity: Goal: Risk for impaired skin integrity will decrease 12/12/2022 1508 by Charlyne Petrin, RN Outcome: Completed/Met 12/12/2022 1437 by Charlyne Petrin, RN Outcome: Adequate for Discharge

## 2022-12-12 NOTE — Plan of Care (Signed)

## 2022-12-12 NOTE — Discharge Instructions (Addendum)
CALL IF YOU HAVE PROBLEMS/CONCERNS Call if you are still struggling despite following these instructions. Call if you have concerns not answered by these instructions   Some BASIC RULES to follow are: Maintain an upright position whenever eating or drinking. Take small bites - just a teaspoon size bite at a time. Eat slowly.  It may also help to eat only one food at a time. Consider nibbling through smaller, more frequent meals & avoid the urge to eat BIG meals Do not push through feelings of fullness, nausea, or bloatedness Do not mix solid foods and liquids in the same mouthful Try not to "wash foods down" with large gulps of liquids. Avoid carbonated (bubbly/fizzy) drinks.   Avoid foods that make you feel gassy or bloated.  Start with bland foods first.  Wait on trying greasy, fried, or spicy meals until you are tolerating more bland solids well. Understand that it will be hard to burp and belch at first.  This gradually improves with time.  Expect to be more gassy/flatulent/bloated initially.  Walking will help your body manage it better. Consider using medications for bloating that contain simethicone such as  Maalox or Gas-X  Consider crushing her medications, especially smaller pills.  The ability to swallow pills should get easier after a few weeks Eat in a relaxed atmosphere & minimize distractions. Avoid talking while eating.   Do not use straws. Following each meal, sit in an upright position (90 degree angle) for 60 to 90 minutes.  Going for a short walk can help as well If food does stick, don't panic.  Try to relax and let the food pass on its own.  Sipping WARM LIQUID such as strong hot black tea can also help slide it down.   LEVEL 2 = SOFT DIET    Keep on this diet until everything goes down easily.  Hot Foods Cold Foods  White fish Cottage cheese  Stuffed fish Junior baby fruit  Baby food meals Semi thickened juices  Minced soft cooked, scrambled, poached eggs  nectars  Souffle & omelets Ripe mashed bananas  Cooked cereals Canned fruit, pineapple sauce, milk  potatoes Milkshake  Buttered or Alfredo noodles Custard  Cooked cooled vegetable Puddings, including tapioca  [place] Yogurt  Vegetable soup or alphabet soup Fruit ice, [place] ice  Gravies Whipped gelatin  Sugar, syrup, honey, jelly Junior baby desserts  Sauces:  Cheese, creamed, barbecue, tomato, white Cream  Coffee or tea Margarine   SAMPLE MENU:  LEVEL 2 Breakfast Lunch Dinner  Orange juice, 1/2 cup Oatmeal, 1/2 cup Scrambled eggs with cheese, 1/2 cup Decaffeinated tea, 1 cup Whole milk, 1 cup Non-dairy creamer, 2 Tbsp Pineapple juice, 1/2 cup Minced beef, 3 oz Gravy, 2 Tbsp Mashed potatoes, 1/2 cup Minced fresh broccoli, 1/2 cup Applesauce, 1/2 cup Coffee, 1 cup [place], barley soup, 3/4 cup Minced Hawaiian chicken, 3 oz Mashed potatoes, 1/2 cup Cooked spinach, 1/2 cup Frozen yogurt, 1/2 cup Non-dairy creamer, 2 Tbsp      LEVEL 3 = CHOPPED DIET  -After all the foods in level 2 (soft diet) are passing through well you should advance up to more chopped foods.  -It is still important to cut these foods into small pieces and eat slowly.  Hot Foods Cold Foods  Poultry Cottage cheese  Chopped Swedish meatballs Yogurt  Meat salads (ground or flaked meat) Milk  Flaked fish (tuna) Milkshakes  Poached or scrambled eggs Soft, cold, dry cereal  Souffles and omelets Fruit juices or nectars  Cooked cereals Chopped canned fruit  Chopped [place] toast or pancakes Canned fruit cocktail  Noodles or pasta (no rice) Pudding, mousse, custard  Cooked vegetables (no frozen peas, corn, or mixed vegetables) Green salad  Canned small sweet peas Ice cream  Creamed soup or vegetable soup Fruit ice, [place] ice  Pureed vegetable soup or alphabet soup Non-dairy creamer  Ground scalloped apples Margarine  Gravies Mayonnaise  Sauces:  Cheese, creamed, barbecue, tomato, white Ketchup   Coffee or tea Mustard   SAMPLE MENU:  LEVEL 3 Breakfast Lunch Dinner  Orange juice, 1/2 cup Oatmeal, 1/2 cup Scrambled eggs with cheese, 1/2 cup Decaffeinated tea, 1 cup Whole milk, 1 cup Non-dairy creamer, 2 Tbsp Ketchup, 1 Tbsp Margarine, 1 tsp Salt, 1/4 tsp Sugar, 2 tsp Pineapple juice, 1/2 cup Ground beef, 3 oz Gravy, 2 Tbsp Mashed potatoes, 1/2 cup Cooked spinach, 1/2 cup Applesauce, 1/2 cup Decaffeinated coffee Whole milk Non-dairy creamer, 2 Tbsp Margarine, 1 tsp Salt, 1/4 tsp Pureed [place], barley soup, 3/4 cup Barbecue chicken, 3 oz Mashed potatoes, 1/2 cup Ground fresh broccoli, 1/2 cup Frozen yogurt, 1/2 cup Decaffeinated tea, 1 cup Non-dairy creamer, 2 Tbsp Margarine, 1 tsp Salt, 1/4 tsp Sugar, 1 tsp    LEVEL 4:  REGULAR FOODS  -Foods in this group are soft, moist, regularly textured foods.   -This level includes meat and breads, which tend to be the hardest things to swallow.   -Eat very slowly, chew well and continue to avoid carbonated drinks.  Hot Foods Cold Foods  Baked fish or skinned Soft cheeses - cottage cheese  Souffles and omelets Cream cheese  Eggs Yogurt  Stuffed shells Milk  Spaghetti with meat sauce Milkshakes  Cooked cereal Cold dry cereals (no nuts, dried fruit, coconut)  [place] toast or pancakes Crackers  Buttered toast Fruit juices or nectars  Noodles or pasta (no rice) Canned fruit  Potatoes (all types) Ripe bananas  Soft, cooked vegetables (no corn, lima, or baked beans) Peeled, ripe, fresh fruit  Creamed soups or vegetable soup Cakes (no nuts, dried fruit, coconut)  Canned chicken noodle soup Plain doughnuts  Gravies Ice cream  Bacon dressing Pudding, mousse, custard  Sauces:  Cheese, creamed, barbecue, tomato, white Fruit ice, [place] ice, sherbet  Decaffeinated tea or coffee Whipped gelatin  Pork chops Regular gelatin   Canned fruited gelatin molds   Sugar, syrup, honey, jam, jelly   Cream   Non-dairy   Margarine    Oil   Mayonnaise   Ketchup   Mustard   TROUBLESHOOTING IRREGULAR BOWELS  1) Avoid extremes of bowel movements (no bad constipation/diarrhea)  2) Miralax 17gm mixed in 8oz. water or juice-daily. May use BID as needed.  3) Gas-x,Phazyme, etc. as needed for gas & bloating.  4) Soft,bland diet. No spicy,greasy,fried foods.  5) Prilosec over-the-counter as needed  6) May hold gluten/wheat products from diet to see if symptoms improve.  7) May try probiotics (Align, Activa, etc) to help calm the bowels down  7) If symptoms become worse call back immediately.    If you have any questions please call our office at [place]: [phone].

## 2022-12-12 NOTE — Progress Notes (Signed)
Subjective: CC: Feels back to normal. NGT out. Tolerating cld without abdominal pain, nausea or vomiting. No prn meds other than tylenol. Passing flatus. No bm. Mobilizing. Reports her husband has surgery in [place] tomorrow.     Objective: Vital signs in last 24 hours: Temp:  [98.4 F (36.9 C)-99 F (37.2 C)] 99 F (37.2 C) (02/05 2022) Pulse Rate:  [49-57] 49 (02/05 2022) Resp:  [14-17] 17 (02/05 2022) BP: (126-150)/(65-75) 140/75 (02/05 2022) SpO2:  [94 %-96 %] 94 % (02/05 2022) Last BM Date : 12/09/22  Intake/Output from previous day: 02/05 0701 - 02/06 0700 In: 2890 [P.O.:760; I.V.:2030; IV Piggyback:100] Out: 600 [Urine:400; Emesis/NG output:200] Intake/Output this shift: No intake/output data recorded.  PE: Gen:  Alert, NAD, pleasant Card:  Reg Pulm:  CTAB, effort normal Abd: Soft, ND, NT +BS, incisions healing well without signs of infection.  Psych: A&Ox3   Lab Results:  Recent Labs    12/09/22 2347 12/11/22 0419  WBC 15.2* 7.8  HGB 14.4 11.7*  HCT 42.9 37.2  PLT 272 188   BMET Recent Labs    12/09/22 2347 12/11/22 0419  NA 138 141  K 3.8 3.7  CL 104 110  CO2 26 25  GLUCOSE 150* 101*  BUN 19 11  CREATININE 0.77 0.54  CALCIUM 8.8* 8.0*   PT/INR No results for input(s): "LABPROT", "INR" in the last 72 hours. CMP     Component Value Date/Time   NA 141 12/11/2022 0419   NA 141 01/14/2020 1608   K 3.7 12/11/2022 0419   CL 110 12/11/2022 0419   CO2 25 12/11/2022 0419   GLUCOSE 101 (H) 12/11/2022 0419   BUN 11 12/11/2022 0419   BUN 17 01/14/2020 1608   CREATININE 0.54 12/11/2022 0419   CREATININE 0.86 06/09/2022 1137   CREATININE 0.77 09/16/2021 1456   CALCIUM 8.0 (L) 12/11/2022 0419   PROT 7.6 12/09/2022 2347   ALBUMIN 4.2 12/09/2022 2347   AST 19 12/09/2022 2347   AST 13 (L) 06/09/2022 1137   ALT 21 12/09/2022 2347   ALT 17 06/09/2022 1137   ALKPHOS 111 12/09/2022 2347   BILITOT 0.5 12/09/2022 2347   BILITOT 0.4  06/09/2022 1137   GFRNONAA >60 12/11/2022 0419   GFRNONAA >60 06/09/2022 1137   GFRAA 57 (L) 01/14/2020 1608   Lipase  No results found for: "LIPASE"  Studies/Results: DG ESOPHAGUS W SINGLE CM (SOL OR THIN BA)  Result Date: 12/11/2022 CLINICAL DATA:  Patient with a history of hiatal hernia repair with mesh and Toupet fundoplication 47/42/5956. Patient now admitted with small bowel obstruction. Radiology team asked to perform contrast study to evaluate hernia repair/wrap. EXAM: ESOPHAGUS/BARIUM SWALLOW/TABLET STUDY TECHNIQUE: Single contrast examination was performed using water-soluble contrast this exam was performed by Soyla Dryer, NP, and was supervised and interpreted by Dr. Leonia Reeves. FLUOROSCOPY: Radiation Exposure Index (as provided by the fluoroscopic device): 11.70 mGy Kerma COMPARISON:  DG esophagus 10/11/2022 FINDINGS: Esophagus: Normal appearance. No hiatal hernia. Nissen fundoplication wrap noted at the level the GE junction. NG tube extends the stomach. Esophageal motility: Within normal limits. Hiatal Hernia: None. Other: No extravasation of contrast identified at site of hernia repair/wrap IMPRESSION: No extravasation of contrast identified at site of fundoplication hernia repair. GE junction below the diaphragm. Read by: Soyla Dryer, NP Electronically Signed   By: Suzy Bouchard M.D.   On: 12/11/2022 11:53   DG Abd Portable 1V  Result Date: 12/10/2022 CLINICAL DATA:  NG tube placement.  EXAM: PORTABLE ABDOMEN - 1 VIEW COMPARISON:  CT scan from earlier same day FINDINGS: NG tube is folded in the stomach. Proximal side port is well below the GE junction. Gaseous distention of the stomach persist despite the presence of the NG tube. Distended small bowel loops visualized in the upper abdomen, as better characterized on recent CT scan. IMPRESSION: NG tube is folded in the stomach. Proximal side port is well below the GE junction. Electronically Signed   By: Misty Stanley M.D.   On:  12/10/2022 08:51    Anti-infectives: Anti-infectives (From admission, onward)    Start     Dose/Rate Route Frequency Ordered Stop   12/10/22 0115  cefTRIAXone (ROCEPHIN) 1 g in sodium chloride 0.9 % 100 mL IVPB        1 g 200 mL/hr over 30 Minutes Intravenous STAT 12/10/22 0113 12/10/22 0201        Assessment/Plan SBO Hx robotic hiatal hernia repair with mesh and Toupet fundoplication by Dr. Rosendo Gros 10/10/22 - Symptomatically and clinically resolved. Tolerating cld without abdominal pain, n/v. Passing flatus. ND/NT on exam. ADAT. If tolerates, likely d/c later today. To note, patient reported that she had progressed from soft diet to regular food last Monday, 12/04/22. Her Esophagram was reassuring on 2/5. We discussed staying on soft diet for the next week before proceeding back to regular diet. Can follow up with Dr. Rosendo Gros in the office.   - Mobilize, pulm toilet   FEN - FLD, ADAT to mech soft diet. SLIV VTE - SCDs, Lovenox ID - Rocephin x 1 in ED. None needed currently. Afebrile. WBC normalized at 7.8 yesterday.   I reviewed nursing notes, last 24 h vitals and pain scores, last 48 h intake and output, last 24 h labs and trends, and last 24 h imaging results.   LOS: 2 days    Jillyn Ledger , [facility] 12/12/2022, 8:07 AM Please see Amion for pager number during day hours 7:00am-4:30pm

## 2022-12-12 NOTE — Discharge Summary (Signed)
Patient ID: Alexandria [name] [id number] July 26, 1961 62 y.o.  Admit date: 12/09/2022 Discharge date: 12/12/2022  Admitting Diagnosis: S/p hiatal hernia repair/ Toupet fundoplication Partial SBO vs. Ileus  Discharge Diagnosis SBO Hx robotic hiatal hernia repair with mesh and Toupet fundoplication by Dr. Rosendo Gros 10/10/22  Consultants None  H&P S/p robotic hiatal hernia repair with Toupet fundoplication by Dr. Rosendo Gros 10/10/22.  She was seen by Dr. Rosendo Gros last week and was released to resume a regular diet.  Last night, she had a crab cake for dinner.  Shortly after that, she had acute onset of epigastric abdominal pain and nausea.  She presented to [facility] for evaluation.  The CT scan questioned possible partial SBO in the RLQ, but the patient reports a bowel movement and flatus last night.  She is quite miserable with distention and nausea, but is unable to vomit.   Procedures None  Hospital Course:  Patient presented as above. On HD1 symptoms were improving. NGT clamped and esophagram obtained. This was reassuring. Patient tolerated NGT clamping so NGT was removed. Diet was advanced and tolerated. On 2/6 the patient was tolerating soft diet without abdominal pain, n/v; VSS without fever, tachycardia or hypotension, having bowel function and felt stable for d/c home. Discussed slowly progressing diet at home over the next 1-2 weeks (handout/instructions provided on AVS). Follow up arranged as noted below. Discussed discharge instructions, restrictions and return/call back precautions.   Allergies as of 12/12/2022       Reactions   Peanut-containing Drug Products Swelling        Medication List     TAKE these medications    acetaminophen 500 MG tablet Commonly known as: TYLENOL Take 500 mg by mouth every 6 (six) hours as needed for moderate pain or headache.   ALPRAZolam 0.5 MG tablet Commonly known as: XANAX TAKE 1/2 TO 1 TABLET BY MOUTH TWICE A DAY AS NEEDED What changed:  how  much to take how to take this when to take this reasons to take this additional instructions   atorvastatin 40 MG tablet Commonly known as: LIPITOR TAKE 1 TABLET BY MOUTH EVERY DAY IN THE EVENING   diltiazem 240 MG 24 hr capsule Commonly known as: CARDIZEM CD TAKE 1 CAPSULE BY MOUTH EVERY DAY   diltiazem 30 MG tablet Commonly known as: CARDIZEM Take 30 mg by mouth daily as needed (afib).   Eliquis 5 MG Tabs tablet Generic drug: apixaban TAKE 1 TABLET BY MOUTH TWICE A DAY   famotidine 40 MG tablet Commonly known as: PEPCID TAKE 1 TABLET BY MOUTH EVERYDAY AT BEDTIME   flecainide 100 MG tablet Commonly known as: TAMBOCOR Take 1 tablet (100 mg total) by mouth 2 (two) times daily.   Iron (Ferrous Sulfate) 325 (65 Fe) MG Tabs Take 325 mg by mouth daily.   levothyroxine 137 MCG tablet Commonly known as: SYNTHROID Take 137 mcg by mouth daily before breakfast.   naloxone 4 MG/0.1ML Liqd nasal spray kit Commonly known as: NARCAN Place 1 spray into the nose once.   pantoprazole 40 MG tablet Commonly known as: [place] 1 TABLET BY MOUTH TWICE A DAY          Follow-up Information     Ralene Ok, MD Follow up on 12/27/2022.   Specialty: General Surgery Why: 830am. Please arrive 30 minutes prior to your appointment for paperwork. Please bring a copy of your photo ID and insurance card. Contact information: [address] [place] [address]  [phone]                 Signed: Alferd Apa, [facility] 12/12/2022, 2:26 PM Please see Amion for pager number during day hours 7:00am-4:30pm

## 2022-12-13 ENCOUNTER — Telehealth: Payer: Self-pay

## 2022-12-13 NOTE — Telephone Encounter (Signed)
Transition Care Management Follow-up Telephone Call Date of discharge and from where: [facility] 12/12/2022 dx: bowel obstruction How have you been since you were released from the hospital? bette Any questions or concerns? No  Items Reviewed: Did the pt receive and understand the discharge instructions provided? Yes  Medications obtained and verified? Yes  Other? No  Any new allergies since your discharge? No  Dietary orders reviewed? Yes Do you have support at home? Yes   Home Care and Equipment/Supplies: Were home health services ordered? not applicable If so, what is the name of the agency? N/a  Has the agency set up a time to come to the patient's home? not applicable Were any new equipment or medical supplies ordered?  No What is the name of the medical supply agency? N/a Were you able to get the supplies/equipment? no Do you have any questions related to the use of the equipment or supplies? No  Functional Questionnaire: (I = Independent and D = Dependent) ADLs: I  Bathing/Dressing- I  Meal Prep- I  Eating- I  Maintaining continence- I  Transferring/Ambulation- I  Managing Meds- I  Follow up appointments reviewed:  PCP Hospital f/u appt confirmed? no Specialist Hospital f/u appt confirmed? Yes  Scheduled to see Dr Rosendo Gros on 12/27/2022 @ 8:30. Are transportation arrangements needed? No  If their condition worsens, is the pt aware to call PCP or go to the Emergency Dept.? Yes Was the patient provided with contact information for the PCP's office or ED? Yes Was to pt encouraged to call back with questions or concerns? Yes Juanda Crumble, LPN [employer] Direct Dial [phone]

## 2022-12-25 ENCOUNTER — Ambulatory Visit (HOSPITAL_COMMUNITY): Payer: BC Managed Care – PPO | Admitting: Physician Assistant

## 2022-12-25 ENCOUNTER — Encounter (HOSPITAL_COMMUNITY): Payer: Self-pay | Admitting: Physician Assistant

## 2022-12-25 ENCOUNTER — Ambulatory Visit (HOSPITAL_COMMUNITY)
Admission: RE | Admit: 2022-12-25 | Discharge: 2022-12-25 | Disposition: A | Discharge status: Home / Self Care | Payer: BC Managed Care – PPO | Source: Ambulatory Visit | Attending: Physician Assistant | Admitting: Physician Assistant

## 2022-12-25 VITALS — BP 116/82 | HR 61 | Ht 67.0 in | Wt 169.6 lb

## 2022-12-25 DIAGNOSIS — E785 Hyperlipidemia, unspecified: Secondary | ICD-10-CM | POA: Diagnosis not present

## 2022-12-25 DIAGNOSIS — I480 Paroxysmal atrial fibrillation: Secondary | ICD-10-CM | POA: Insufficient documentation

## 2022-12-25 DIAGNOSIS — E0500 Thyrotoxicosis with diffuse goiter without thyrotoxic crisis or storm: Secondary | ICD-10-CM | POA: Insufficient documentation

## 2022-12-25 DIAGNOSIS — D6869 Other thrombophilia: Secondary | ICD-10-CM | POA: Diagnosis not present

## 2022-12-25 DIAGNOSIS — Z79899 Other long term (current) drug therapy: Secondary | ICD-10-CM | POA: Insufficient documentation

## 2022-12-25 DIAGNOSIS — Z8673 Personal history of transient ischemic attack (TIA), and cerebral infarction without residual deficits: Secondary | ICD-10-CM | POA: Diagnosis not present

## 2022-12-25 DIAGNOSIS — I10 Essential (primary) hypertension: Secondary | ICD-10-CM | POA: Diagnosis not present

## 2022-12-25 DIAGNOSIS — Z7901 Long term (current) use of anticoagulants: Secondary | ICD-10-CM | POA: Diagnosis not present

## 2022-12-25 NOTE — Progress Notes (Signed)
Primary Care Physician: Carollee Herter, Alferd Apa, DO Primary Cardiologist: none Primary Electrophysiologist: Dr Rayann Heman Referring Physician: Dr Dwaine Gale is a 62 y.o. female with a history of Graves disease, hiatal hernia, HLD, HTN, CVA, atrial fibrillation who presents for follow up in the [facility]. The patient was initially diagnosed with atrial fibrillation 2018/2019 and underwent afib ablation 08/2018 with Dr Rayann Heman. She has been maintained on flecainide. Patient is on Eliquis for a CHADS2VASC score of 4.  On follow up today, patient was recently hospitalized with a SBO vs ileus post hiatal hernia repair. She has recovered well and only had one brief episode of afib while she was acutely ill. No bleeding issues on anticoagulation.   Today, she denies symptoms of palpitations, chest pain, shortness of breath, orthopnea, PND, lower extremity edema, dizziness, presyncope, syncope, snoring, daytime somnolence, bleeding, or neurologic sequela. The patient is tolerating medications without difficulties and is otherwise without complaint today.    Atrial Fibrillation Risk Factors:  she does not have symptoms or diagnosis of sleep apnea. she does not have a history of rheumatic fever.   she has a BMI of Body mass index is 26.56 kg/m.Marland Kitchen Filed Weights   12/25/22 1110  Weight: 76.9 kg    Family History  Problem Relation Age of Onset   Hyperlipidemia Mother    Dementia Mother    Coronary artery disease Father    Atrial fibrillation Father    Skin cancer Father    Colon polyps Brother        says he goes every year to have a colonoscopy   Diabetes Maternal Grandmother    Coronary artery disease Paternal Grandfather    Coronary artery disease Paternal Uncle    Colon cancer Neg Hx    Esophageal cancer Neg Hx      Atrial Fibrillation Management history:  Previous antiarrhythmic drugs: flecainide  Previous cardioversions: none Previous  ablations: 2019 CHADS2VASC score: 4 Anticoagulation history: Eliquis   Past Medical History:  Diagnosis Date   Anemia    hx of iron deficiency anemia (pt had iron infusions)   Anxiety    Atrial fibrillation (HCC)    Depression    GERD (gastroesophageal reflux disease)    Graves disease    Hiatal hernia    a. seen on CT 12/2016.   Hyperlipidemia    Hypertension    Hypothyroidism    pt had radioiodine treatment on thyroid   Low iron    pt received iron infusions 08/2022   NSVT (nonsustained ventricular tachycardia) (HCC)    Pulmonary nodules    a. seen on CT 12/2016.   Stroke [facility])    TIA (transient ischemic attack) 01/2017   a. tx at [facility].   Past Surgical History:  Procedure Laterality Date   ATRIAL FIBRILLATION ABLATION N/A 09/12/2018   Procedure: ATRIAL FIBRILLATION ABLATION;  Surgeon: Thompson Grayer, MD;  Location: [place] [facility];  Service: Cardiovascular;  Laterality: N/A;   BREAST LUMPECTOMY WITH RADIOACTIVE SEED LOCALIZATION Right 06/28/2022   Procedure: RIGHT BREAST LUMPECTOMY WITH RADIOACTIVE SEED LOCALIZATION;  Surgeon: Erroll Luna, MD;  Location: [place];  Service: General;  Laterality: Right;   CARPAL TUNNEL RELEASE Right Welcome OF UTERUS  2018   INSERTION OF MESH N/A 10/10/2022   Procedure: INSERTION OF MESH;  Surgeon: Ralene Ok, MD;  Location: [place];  Service: General;  Laterality: N/A;   THYROID SURGERY  2012  radioactive iodine swallowed   WISDOM TOOTH EXTRACTION  1980   XI ROBOTIC ASSISTED HIATAL HERNIA REPAIR N/A 10/10/2022   Procedure: XI ROBOTIC ASSISTED HIATAL HERNIA REPAIR WITH MESH AND FUNDOPLICATION;  Surgeon: Ralene Ok, MD;  Location: [place];  Service: General;  Laterality: N/A;    Current Outpatient Medications  Medication Sig Dispense Refill   acetaminophen (TYLENOL) 500 MG tablet Take 500 mg by mouth every 6 (six) hours as needed for moderate pain or headache.     ALPRAZolam (XANAX) 0.5 MG tablet  TAKE 1/2 TO 1 TABLET BY MOUTH TWICE A DAY AS NEEDED (Patient taking differently: Take 0.125-0.25 mg by mouth 2 (two) times daily as needed for anxiety.) 30 tablet 1   atorvastatin (LIPITOR) 40 MG tablet TAKE 1 TABLET BY MOUTH EVERY DAY IN THE EVENING 90 tablet 3   diltiazem (CARDIZEM CD) 240 MG 24 hr capsule TAKE 1 CAPSULE BY MOUTH EVERY DAY 90 capsule 2   diltiazem (CARDIZEM) 30 MG tablet Take 30 mg by mouth daily as needed (afib).     ELIQUIS 5 MG TABS tablet TAKE 1 TABLET BY MOUTH TWICE A DAY 60 tablet 6   famotidine (PEPCID) 40 MG tablet TAKE 1 TABLET BY MOUTH EVERYDAY AT BEDTIME 90 tablet 2   flecainide (TAMBOCOR) 100 MG tablet Take 1 tablet (100 mg total) by mouth 2 (two) times daily. 180 tablet 3   Iron, Ferrous Sulfate, 325 (65 Fe) MG TABS Take 325 mg by mouth daily. 30 tablet 5   levothyroxine (SYNTHROID) 137 MCG tablet Take 137 mcg by mouth daily before breakfast.     naloxone (NARCAN) nasal spray 4 mg/0.1 mL Place 1 spray into the nose once.     pantoprazole (PROTONIX) 40 MG tablet TAKE 1 TABLET BY MOUTH TWICE A DAY 180 tablet 1   No current facility-administered medications for this encounter.    Allergies  Allergen Reactions   Peanut-Containing Drug Products Swelling    Social History   Socioeconomic History   Marital status: Married    Spouse name: Not on file   Number of children: 4   Years of education: Not on file   Highest education level: Not on file  Occupational History   Occupation: Music therapist    Comment: retired  Tobacco Use   Smoking status: Never   Smokeless tobacco: Never   Tobacco comments:    Never smoke 12/25/22  Vaping Use   Vaping Use: Never used  Substance and Sexual Activity   Alcohol use: Yes    Alcohol/week: 1.0 standard drink of alcohol    Types: 1 Glasses of wine per week    Comment: very occasionally, not every week   Drug use: No   Sexual activity: Yes  Other Topics Concern   Not on file  Social History Narrative   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   Social Determinants of [place] Strain: Not on file  Food Insecurity: No Protection (12/10/2022)   Hunger Vital Sign    Worried About [place] in the Last Year: Never true    [place] in the Last Year: Never true  Transportation Needs: No Transportation Needs (12/10/2022)   PRAPARE - Hydrologist (Medical): No    Lack of Transportation (Non-Medical): No  Physical Activity: Not on file  Stress: Not on file  Social Connections: Not on file  Intimate Partner Violence: Not  At Risk (12/10/2022)   Humiliation, Afraid, Rape, and Kick questionnaire    Fear of Current or Ex-Partner: No    Emotionally Abused: No    Physically Abused: No    Sexually Abused: No     ROS- All systems are reviewed and negative except as per the HPI above.  Physical Exam: Vitals:   12/25/22 1110  BP: 116/82  Pulse: 61  Weight: 76.9 kg  Height: 5' 7"$  (1.702 m)    GEN- The patient is a well appearing female, alert and oriented x 3 today.   Head- normocephalic, atraumatic Eyes-  Sclera clear, conjunctiva pink Ears- hearing intact Oropharynx- clear Neck- supple  Lungs- Clear to ausculation bilaterally, normal work of breathing Heart- Regular rate and rhythm, no murmurs, rubs or gallops  GI- soft, NT, ND, + BS Extremities- no clubbing, cyanosis, or edema MS- no significant deformity or atrophy Skin- no rash or lesion Psych- euthymic mood, full affect Neuro- strength and sensation are intact  Wt Readings from Last 3 Encounters:  12/25/22 76.9 kg  12/09/22 75.8 kg  10/19/22 81.2 kg    EKG today demonstrates  SR, 1st degree AV block Vent. rate 61 BPM PR interval 222 ms QRS duration 96 ms QT/QTcB 428/430 ms  Echo 04/05/18 demonstrated  - Left ventricle: The cavity size was normal. Systolic function was    normal. The estimated ejection fraction was in the range of 60%    to  65%. Wall motion was normal; there were no regional wall    motion abnormalities. Left ventricular diastolic function    parameters were normal.  - Mitral valve: There was mild regurgitation.  - Left atrium: The atrium was mildly dilated.  - Atrial septum: No defect or patent foramen ovale was identified.  - Impressions: Appears to be some shadowing artifact in LA on    subcostal images.   Epic records are reviewed at length today  CHA2DS2-VASc Score = 4  The patient's score is based upon: CHF History: 0 HTN History: 1 Diabetes History: 0 Stroke History: 2 Vascular Disease History: 0 Age Score: 0 Gender Score: 1       ASSESSMENT AND PLAN: 1. Paroxysmal Atrial Fibrillation (ICD10:  I48.0) The patient's CHA2DS2-VASc score is 4, indicating a 4.8% annual risk of stroke.   Patient appears to be maintaining SR Continue flecainide 100 mg BID Continue diltiazem 240 mg daily with 30 mg PRN for heart racing. Continue Eliquis 5 mg BID  2. Secondary Hypercoagulable State (ICD10:  D68.69) The patient is at significant risk for stroke/thromboembolism based upon her CHA2DS2-VASc Score of 4.  Continue Apixaban (Eliquis).   3. HTN Stable, no changes today.   Follow up with EP to establish care in 6 months.    [facility] [address] [place], [place] [address] [phone] 12/25/2022 11:20 AM

## 2022-12-27 ENCOUNTER — Encounter: Payer: Self-pay | Admitting: Family

## 2022-12-27 ENCOUNTER — Inpatient Hospital Stay: Payer: BC Managed Care – PPO | Attending: Hematology & Oncology

## 2022-12-27 ENCOUNTER — Inpatient Hospital Stay (HOSPITAL_BASED_OUTPATIENT_CLINIC_OR_DEPARTMENT_OTHER): Payer: BC Managed Care – PPO | Admitting: Family

## 2022-12-27 VITALS — BP 123/68 | HR 65 | Temp 98.4°F | Resp 17 | Wt 169.0 lb

## 2022-12-27 DIAGNOSIS — D509 Iron deficiency anemia, unspecified: Secondary | ICD-10-CM | POA: Insufficient documentation

## 2022-12-27 LAB — RETICULOCYTES
Immature Retic Fract: 5.1 % (ref 2.3–15.9)
RBC.: 4.55 MIL/uL (ref 3.87–5.11)
Retic Count, Absolute: 49.6 10*3/uL (ref 19.0–186.0)
Retic Ct Pct: 1.1 % (ref 0.4–3.1)

## 2022-12-27 LAB — CBC WITH DIFFERENTIAL (CANCER CENTER ONLY)
Abs Immature Granulocytes: 0.06 10*3/uL (ref 0.00–0.07)
Basophils Absolute: 0.1 10*3/uL (ref 0.0–0.1)
Basophils Relative: 1 %
Eosinophils Absolute: 0.1 10*3/uL (ref 0.0–0.5)
Eosinophils Relative: 2 %
HCT: 41.6 % (ref 36.0–46.0)
Hemoglobin: 13.6 g/dL (ref 12.0–15.0)
Immature Granulocytes: 1 %
Lymphocytes Relative: 26 %
Lymphs Abs: 1.5 10*3/uL (ref 0.7–4.0)
MCH: 29.6 pg (ref 26.0–34.0)
MCHC: 32.7 g/dL (ref 30.0–36.0)
MCV: 90.4 fL (ref 80.0–100.0)
Monocytes Absolute: 0.5 10*3/uL (ref 0.1–1.0)
Monocytes Relative: 8 %
Neutro Abs: 3.7 10*3/uL (ref 1.7–7.7)
Neutrophils Relative %: 62 %
Platelet Count: 275 10*3/uL (ref 150–400)
RBC: 4.6 MIL/uL (ref 3.87–5.11)
RDW: 14.3 % (ref 11.5–15.5)
WBC Count: 5.9 10*3/uL (ref 4.0–10.5)
nRBC: 0 % (ref 0.0–0.2)

## 2022-12-27 LAB — FERRITIN: Ferritin: 163 ng/mL (ref 11–307)

## 2022-12-27 NOTE — Progress Notes (Signed)
Hematology and Oncology Follow Up Visit  Alexandria [name] [id number] 02/09/1961 62 y.o. 12/27/2022   Principle Diagnosis:  Iron deficiency anemia   Current Therapy:        IV iron as indicated                Interim History:  Alexandria [name] is here today for follow-up. She is doing well at this time.  She had some complications with an ilius/SBO after having her hernia repair. She states that she had to go to the ED last week to have her stomach pumped. She is doing much better and is able to eat. She is doing her best to stay well hydrated. Her weight is stable at 169 lbs.  No fever, chills, n/v, cough, rash, dizziness, SOB, chest pain, palpitations, abdominal pain or changes in bowel or bladder habits.  No palpitations on her current dose of Tambocor 100 mg PO BID.  She has not noted any blood loss. No bruising or petechiae.  No swelling, tenderness, numbness or tingling in her extremities.  No falls or syncope reported.   ECOG Performance Status: 1 - Symptomatic but completely ambulatory  Medications:  Allergies as of 12/27/2022       Reactions   Peanut-containing Drug Products Swelling        Medication List        Accurate as of December 27, 2022  2:28 PM. If you have any questions, ask your nurse or doctor.          acetaminophen 500 MG tablet Commonly known as: TYLENOL Take 500 mg by mouth every 6 (six) hours as needed for moderate pain or headache.   ALPRAZolam 0.5 MG tablet Commonly known as: XANAX TAKE 1/2 TO 1 TABLET BY MOUTH TWICE A DAY AS NEEDED What changed:  how much to take how to take this when to take this reasons to take this additional instructions   atorvastatin 40 MG tablet Commonly known as: LIPITOR TAKE 1 TABLET BY MOUTH EVERY DAY IN THE EVENING   diltiazem 240 MG 24 hr capsule Commonly known as: CARDIZEM CD TAKE 1 CAPSULE BY MOUTH EVERY DAY   diltiazem 30 MG tablet Commonly known as: CARDIZEM Take 30 mg by mouth daily as needed  (afib).   Eliquis 5 MG Tabs tablet Generic drug: apixaban TAKE 1 TABLET BY MOUTH TWICE A DAY   famotidine 40 MG tablet Commonly known as: PEPCID TAKE 1 TABLET BY MOUTH EVERYDAY AT BEDTIME   flecainide 100 MG tablet Commonly known as: TAMBOCOR Take 1 tablet (100 mg total) by mouth 2 (two) times daily.   Iron (Ferrous Sulfate) 325 (65 Fe) MG Tabs Take 325 mg by mouth daily.   levothyroxine 137 MCG tablet Commonly known as: SYNTHROID Take 137 mcg by mouth daily before breakfast.   naloxone 4 MG/0.1ML Liqd nasal spray kit Commonly known as: NARCAN Place 1 spray into the nose once.   pantoprazole 40 MG tablet Commonly known as: PROTONIX TAKE 1 TABLET BY MOUTH TWICE A DAY        Allergies:  Allergies  Allergen Reactions   Peanut-Containing Drug Products Swelling    Past Medical History, Surgical history, Social history, and Family History were reviewed and updated.  Review of Systems: All other 10 point review of systems is negative.   Physical Exam:  weight is 169 lb (76.7 kg). Her oral temperature is 98.4 F (36.9 C). Her blood pressure is 123/68 and her pulse is 65. Her respiration  is 17 and oxygen saturation is 98%.   Wt Readings from Last 3 Encounters:  12/27/22 169 lb (76.7 kg)  12/25/22 169 lb 9.6 oz (76.9 kg)  12/09/22 167 lb (75.8 kg)    Ocular: Sclerae unicteric, pupils equal, round and reactive to light Ear-nose-throat: Oropharynx clear, dentition fair Lymphatic: No cervical or supraclavicular adenopathy Lungs no rales or rhonchi, good excursion bilaterally Heart regular rate and rhythm, no murmur appreciated Abd soft, nontender, positive bowel sounds MSK no focal spinal tenderness, no joint edema Neuro: non-focal, well-oriented, appropriate affect Breasts: Deferred   Lab Results  Component Value Date   WBC 5.9 12/27/2022   HGB 13.6 12/27/2022   HCT 41.6 12/27/2022   MCV 90.4 12/27/2022   PLT 275 12/27/2022   Lab Results  Component Value  Date   FERRITIN 9 (L) 08/25/2022   IRON 94 10/19/2022   TIBC 247.8 (L) 10/19/2022   UIBC 301 08/25/2022   IRONPCTSAT 37.9 10/19/2022   Lab Results  Component Value Date   RETICCTPCT 1.1 12/27/2022   RBC 4.60 12/27/2022   No results found for: "KPAFRELGTCHN", "LAMBDASER", "KAPLAMBRATIO" No results found for: "IGGSERUM", "IGA", "IGMSERUM" No results found for: "TOTALPROTELP", "ALBUMINELP", "A1GS", "A2GS", "BETS", "BETA2SER", "GAMS", "MSPIKE", "SPEI"   Chemistry      Component Value Date/Time   NA 141 12/11/2022 0419   NA 141 01/14/2020 1608   K 3.7 12/11/2022 0419   CL 110 12/11/2022 0419   CO2 25 12/11/2022 0419   BUN 11 12/11/2022 0419   BUN 17 01/14/2020 1608   CREATININE 0.54 12/11/2022 0419   CREATININE 0.86 06/09/2022 1137   CREATININE 0.77 09/16/2021 1456      Component Value Date/Time   CALCIUM 8.0 (L) 12/11/2022 0419   ALKPHOS 111 12/09/2022 2347   AST 19 12/09/2022 2347   AST 13 (L) 06/09/2022 1137   ALT 21 12/09/2022 2347   ALT 17 06/09/2022 1137   BILITOT 0.5 12/09/2022 2347   BILITOT 0.4 06/09/2022 1137       Impression and Plan: Alexandria [name] is a very pleasant 62 yo caucasian female with history of iron deficiency anemia since childhood.   Iron studies pending. We will replace if needed.  Follow-up in 6 months.   Alexandria Dawson, NP 2/21/20242:28 PM

## 2022-12-28 LAB — IRON AND IRON BINDING CAPACITY (CC-WL,HP ONLY)
Iron: 80 ug/dL (ref 28–170)
Saturation Ratios: 33 % — ABNORMAL HIGH (ref 10.4–31.8)
TIBC: 241 ug/dL — ABNORMAL LOW (ref 250–450)
UIBC: 161 ug/dL (ref 148–442)

## 2023-02-18 ENCOUNTER — Encounter: Payer: Self-pay | Admitting: Family Medicine

## 2023-02-19 ENCOUNTER — Ambulatory Visit: Payer: BC Managed Care – PPO | Admitting: Family Medicine

## 2023-02-19 ENCOUNTER — Encounter: Payer: Self-pay | Admitting: Family Medicine

## 2023-02-19 VITALS — BP 100/80 | HR 62 | Temp 98.7°F | Resp 18 | Ht 67.0 in | Wt 178.2 lb

## 2023-02-19 DIAGNOSIS — W540XXA Bitten by dog, initial encounter: Secondary | ICD-10-CM

## 2023-02-19 DIAGNOSIS — R739 Hyperglycemia, unspecified: Secondary | ICD-10-CM

## 2023-02-19 DIAGNOSIS — Z23 Encounter for immunization: Secondary | ICD-10-CM | POA: Diagnosis not present

## 2023-02-19 DIAGNOSIS — S71151A Open bite, right thigh, initial encounter: Secondary | ICD-10-CM

## 2023-02-19 MED ORDER — CEPHALEXIN 500 MG PO CAPS
500.0000 mg | ORAL_CAPSULE | Freq: Two times a day (BID) | ORAL | 0 refills | Status: DC
Start: 2023-02-19 — End: 2023-06-25

## 2023-02-19 NOTE — Assessment & Plan Note (Signed)
Abx per orders Tdap given

## 2023-02-19 NOTE — Patient Instructions (Signed)
Animal Bite, Adult ?Animal bites range from mild to serious. An animal bite can result in any of these injuries: ?A scratch. ?A deep, open cut. ?Broken (punctured) or torn skin. ?A crush injury. ?A bone injury. ?A small bite from a house pet is usually less serious than a bite from a stray or wild animal. Cat bites can be more serious because their long, thin teeth can cause deep puncture wounds that close fast, trapping bacteria inside. ?Stray or wild animals, such as a raccoon, fox, skunk, or bat, are at higher risk of carrying a serious infection called rabies, which they can pass to a human through a bite. A bite from one of these animals needs medical care right away and, sometimes, rabies vaccination. ?What increases the risk? ?You are more likely to be bitten by an animal if: ?You are around unfamiliar pets. ?You disturb an animal when it is eating, sleeping, or caring for its babies. ?You are outdoors in a place where small, wild animals roam freely. ?What are the signs or symptoms? ?Common symptoms of an animal bite include: ?Pain. ?Bleeding. ?Swelling. ?Bruising. ?How is this diagnosed? ?This condition may be diagnosed based on a physical exam and medical history. Your health care provider will examine your wound and ask for details about the animal and how the bite happened. You may also have tests, such as: ?Blood tests to check for infection. ?X-rays to check for damage to bones or joints. ?Taking a fluid sample from your wound and checking it for infection (culture test). ?How is this treated? ?Treatment depends on the type of animal, where the bite is on your body, and your medical history. Treatment may include: ?Wound care. This often includes cleaning the wound and rinsing it out (flushing it) with saline solution, which is made of salt and water. A bandage (dressing) is also often applied. In rare cases, the wound may be closed with stitches (sutures), staples, skin glue, or adhesive  strips. ?Antibiotic medicine to prevent or treat infection. This medicine may be prescribed in pill or ointment form. If the bite area gets infected, the medicine may be given through an IV. ?A tetanus shot to prevent tetanus infection. ?Rabies treatment to prevent rabies infection, if the animal could have rabies. ?Surgery. This may be done if a bite gets infected or causes damage that needs to be repaired. ?Follow these instructions at home: ?Medicines ?Take or apply over-the-counter and prescription medicines only as told by your health care provider. ?If you were prescribed an antibiotic medicine, take or apply it as told by your health care provider. Do not stop using the antibiotic even if you start to feel better. ?Wound care ? ?Follow instructions from your health care provider about how to take care of your wound. Make sure you: ?Wash your hands with soap and water for at least 20 seconds before and after you change your dressing. If soap and water are not available, use hand sanitizer. ?Change your dressing as told by your health care provider. ?Leave sutures, skin glue, or adhesive strips in place. These skin closures may need to stay in place for 2 weeks or longer. If adhesive strip edges start to loosen and curl up, you may trim the loose edges. Do not remove adhesive strips completely unless your health care provider tells you to do that. ?Check your wound every day for signs of infection. Check for: ?More redness, swelling, or pain. ?More fluid or blood. ?Warmth. ?Pus or a bad smell. ?General   instructions ? ?Raise (elevate) the injured area above the level of your heart while you are sitting or lying down, if this is possible. ?If directed, put ice on the injured area. To do this: ?Put ice in a plastic bag. ?Place a towel between your skin and the bag. ?Leave the ice on for 20 minutes, 2-3 times per day. ?Remove the ice if your skin turns bright red. This is very important. If you cannot feel pain,  heat, or cold, you have a greater risk of damage to the area. ?Keep all follow-up visits. This is important. ?Contact a health care provider if: ?You have more redness, swelling, or pain around your wound. ?Your wound feels warm to the touch. ?You have a fever or chills. ?You have a general feeling of sickness (malaise). ?You feel nauseous or you vomit. ?You have pain that does not get better. ?Get help right away if: ?You have a red streak that leads away from your wound. ?You have non-clear fluid or more blood coming from your wound. ?There is pus or a bad smell coming from your wound. ?You have trouble moving your injured area. ?You have numbness or tingling that spreads beyond your wound. ?Summary ?Animal bites can range from mild to serious. An animal bite can cause a scratch on the skin, a deep and open cut, torn or punctured skin, a crush injury, or a bone injury. ?A bite from a stray or wild animal needs medical care right away and, sometimes, rabies vaccination. ?Your health care provider will examine your wound and ask for details about the animal and how the bite happened. ?Treatment may include wound care, antibiotic medicine, a tetanus shot, and rabies treatment if the animal could have rabies. ?This information is not intended to replace advice given to you by your health care provider. Make sure you discuss any questions you have with your health care provider. ?Document Revised: 10/28/2021 Document Reviewed: 10/28/2021 ?Elsevier Patient Education ? 2023 [employer] ? ?

## 2023-02-19 NOTE — Progress Notes (Addendum)
Subjective:   By signing my name below, I, Alexandria [name], attest that this documentation has been prepared under the direction and in the presence of Alexandria Spates R, DO. 02/19/2023.   Patient ID: Alexandria [name], female    DOB: 1961/10/18, 62 y.o.   MRN: [id number]  Chief Complaint  Patient presents with   Animal Bite    Right thigh, bite happened Saturday. Dog was patient's friends. Up to date on all shots    HPI Patient is in today for an office visit.  Dog bite:  She was bitten by her friend's dog last Saturday 4/13 while she was standing. The bite is located just superior to her right knee. She states this is a minor injury and is mostly concerned because of her recent procedure. She had recently received stitches subsequent to basal cell carcinoma removal (Mohs) of her left cheek on Thursday 4/11 prior to the dog bite on Saturday. She has had prior Mohs surgeries on her right leg, back, and nose.  Headaches:  Additionally she complains of a headache today. She also reports about 3-4 episodes initially starting with a headache, then progresses to feeling hot and sick. Since her symptoms seem to improve after eating sugary foods, she believes these are hypoglycemic episodes.   Past Medical History:  Diagnosis Date   Anemia    hx of iron deficiency anemia (pt had iron infusions)   Anxiety    Atrial fibrillation    Depression    GERD (gastroesophageal reflux disease)    Graves disease    Hiatal hernia    a. seen on CT 12/2016.   Hyperlipidemia    Hypertension    Hypothyroidism    pt had radioiodine treatment on thyroid   Low iron    pt received iron infusions 08/2022   NSVT (nonsustained ventricular tachycardia)    Pulmonary nodules    a. seen on CT 12/2016.   Stroke    TIA (transient ischemic attack) 01/2017   a. tx at [facility].    Past Surgical History:  Procedure Laterality Date   ATRIAL FIBRILLATION ABLATION N/A 09/12/2018   Procedure: ATRIAL FIBRILLATION  ABLATION;  Surgeon: Alexandria Range, MD;  Location: [facility] INVASIVE CV LAB;  Service: Cardiovascular;  Laterality: N/A;   BREAST LUMPECTOMY WITH RADIOACTIVE SEED LOCALIZATION Right 06/28/2022   Procedure: RIGHT BREAST LUMPECTOMY WITH RADIOACTIVE SEED LOCALIZATION;  Surgeon: Alexandria Bouillon, MD;  Location: [facility] OR;  Service: General;  Laterality: Right;   CARPAL TUNNEL RELEASE Right 1991   DILATION AND CURETTAGE OF UTERUS  2018   INSERTION OF MESH N/A 10/10/2022   Procedure: INSERTION OF MESH;  Surgeon: Alexandria Filler, MD;  Location: [facility] OR;  Service: General;  Laterality: N/A;   THYROID SURGERY  2012   radioactive iodine swallowed   WISDOM TOOTH EXTRACTION  1980   XI ROBOTIC ASSISTED HIATAL HERNIA REPAIR N/A 10/10/2022   Procedure: XI ROBOTIC ASSISTED HIATAL HERNIA REPAIR WITH MESH AND FUNDOPLICATION;  Surgeon: Alexandria Filler, MD;  Location: [facility] OR;  Service: General;  Laterality: N/A;    Family History  Problem Relation Age of Onset   Hyperlipidemia Mother    Dementia Mother    Coronary artery disease Father    Atrial fibrillation Father    Skin cancer Father    Colon polyps Brother        says he goes every year to have a colonoscopy   Diabetes Maternal Grandmother    Coronary artery disease Paternal Grandfather  Coronary artery disease Paternal Uncle    Colon cancer Neg Hx    Esophageal cancer Neg Hx     Social History   Socioeconomic History   Marital status: Married    Spouse name: Not on file   Number of children: 4   Years of education: Not on file   Highest education level: Not on file  Occupational History   Occupation: Editor, commissioning    Comment: retired  Tobacco Use   Smoking status: Never   Smokeless tobacco: Never   Tobacco comments:    Never smoke 12/25/22  Vaping Use   Vaping Use: Never used  Substance and Sexual Activity   Alcohol use: Yes    Alcohol/week: 1.0 standard drink of alcohol    Types: 1 Glasses of wine per week    Comment: very occasionally, not  every week   Drug use: No   Sexual activity: Yes  Other Topics Concern   Not on file  Social History Narrative   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   Social Determinants of Health   Financial Resource Strain: Not on file  Food Insecurity: No Food Insecurity (12/10/2022)   Hunger Vital Sign    Worried About Running Out of Food in the Last Year: Never true    Ran Out of Food in the Last Year: Never true  Transportation Needs: No Transportation Needs (12/10/2022)   PRAPARE - Administrator, Civil Service (Medical): No    Lack of Transportation (Non-Medical): No  Physical Activity: Not on file  Stress: Not on file  Social Connections: Not on file  Intimate Partner Violence: Not At Risk (12/10/2022)   Humiliation, Afraid, Rape, and Kick questionnaire    Fear of Current or Ex-Partner: No    Emotionally Abused: No    Physically Abused: No    Sexually Abused: No    Outpatient Medications Prior to Visit  Medication Sig Dispense Refill   acetaminophen (TYLENOL) 500 MG tablet Take 500 mg by mouth every 6 (six) hours as needed for moderate pain or headache.     ALPRAZolam (XANAX) 0.5 MG tablet TAKE 1/2 TO 1 TABLET BY MOUTH TWICE A DAY AS NEEDED (Patient taking differently: Take 0.125-0.25 mg by mouth 2 (two) times daily as needed for anxiety.) 30 tablet 1   atorvastatin (LIPITOR) 40 MG tablet TAKE 1 TABLET BY MOUTH EVERY DAY IN THE EVENING 90 tablet 3   diltiazem (CARDIZEM CD) 240 MG 24 hr capsule TAKE 1 CAPSULE BY MOUTH EVERY DAY 90 capsule 2   diltiazem (CARDIZEM) 30 MG tablet Take 30 mg by mouth daily as needed (afib).     ELIQUIS 5 MG TABS tablet TAKE 1 TABLET BY MOUTH TWICE A DAY 60 tablet 6   famotidine (PEPCID) 40 MG tablet TAKE 1 TABLET BY MOUTH EVERYDAY AT BEDTIME 90 tablet 2   flecainide (TAMBOCOR) 100 MG tablet Take 1 tablet (100 mg total) by mouth 2 (two) times daily. 180 tablet 3   levothyroxine (SYNTHROID) 137 MCG tablet Take 137 mcg by mouth  daily before breakfast.     Iron, Ferrous Sulfate, 325 (65 Fe) MG TABS Take 325 mg by mouth daily. 30 tablet 5   naloxone (NARCAN) nasal spray 4 mg/0.1 mL Place 1 spray into the nose once.     pantoprazole (PROTONIX) 40 MG tablet TAKE 1 TABLET BY MOUTH TWICE A DAY 180 tablet 1   No facility-administered medications prior to visit.  Allergies  Allergen Reactions   Peanut-Containing Drug Products Swelling    Review of Systems  Constitutional:  Negative for fever and malaise/fatigue.  HENT:  Negative for congestion.   Eyes:  Negative for blurred vision.  Respiratory:  Negative for shortness of breath.   Cardiovascular:  Negative for chest pain, palpitations and leg swelling.  Gastrointestinal:  Negative for abdominal pain, blood in stool and nausea.  Genitourinary:  Negative for dysuria and frequency.  Musculoskeletal:  Negative for falls.  Skin:  Negative for rash.       Dog bite of right thigh.  Neurological:  Positive for headaches. Negative for dizziness and loss of consciousness.  Endo/Heme/Allergies:  Negative for environmental allergies.  Psychiatric/Behavioral:  Negative for depression. The patient is not nervous/anxious.    See HPI.     Objective:    Physical Exam Vitals and nursing note reviewed.  Constitutional:      Appearance: Normal appearance.  HENT:     Head: Normocephalic and atraumatic.     Comments: Stitches of left cheek, healing well.    Right Ear: Tympanic membrane, ear canal and external ear normal.     Left Ear: Tympanic membrane, ear canal and external ear normal.  Eyes:     Extraocular Movements: Extraocular movements intact.     Pupils: Pupils are equal, round, and reactive to light.  Cardiovascular:     Rate and Rhythm: Normal rate and regular rhythm.     Heart sounds: Normal heart sounds. No murmur heard.    No gallop.  Pulmonary:     Effort: Pulmonary effort is normal. No respiratory distress.     Breath sounds: Normal breath sounds. No  wheezing or rales.  Skin:    General: Skin is warm and dry.     Comments: Dog bite of right thigh.  Neurological:     General: No focal deficit present.     Mental Status: She is alert and oriented to person, place, and time.  Psychiatric:        Mood and Affect: Mood normal.        Behavior: Behavior normal.     BP 100/80 (BP Location: Left Arm, Patient Position: Sitting, Cuff Size: Normal)   Pulse 62   Temp 98.7 F (37.1 C) (Oral)   Resp 18   Ht 5\' 7"  (1.702 m)   Wt 178 lb 3.2 oz (80.8 kg)   LMP 01/26/2012   SpO2 97%   BMI 27.91 kg/m  Wt Readings from Last 3 Encounters:  02/19/23 178 lb 3.2 oz (80.8 kg)  12/27/22 169 lb (76.7 kg)  12/25/22 169 lb 9.6 oz (76.9 kg)    Diabetic Foot Exam - Simple   No data filed    Lab Results  Component Value Date   WBC 5.9 12/27/2022   HGB 13.6 12/27/2022   HCT 41.6 12/27/2022   PLT 275 12/27/2022   GLUCOSE 101 (H) 12/11/2022   CHOL 130 10/19/2022   TRIG 107.0 10/19/2022   HDL 33.10 (L) 10/19/2022   LDLCALC 75 10/19/2022   ALT 21 12/09/2022   AST 19 12/09/2022   NA 141 12/11/2022   K 3.7 12/11/2022   CL 110 12/11/2022   CREATININE 0.54 12/11/2022   BUN 11 12/11/2022   CO2 25 12/11/2022   TSH 0.47 10/19/2022   HGBA1C 6.4 04/18/2022   MICROALBUR 0.4 09/16/2021    Lab Results  Component Value Date   TSH 0.47 10/19/2022   Lab Results  Component Value  Date   WBC 5.9 12/27/2022   HGB 13.6 12/27/2022   HCT 41.6 12/27/2022   MCV 90.4 12/27/2022   PLT 275 12/27/2022   Lab Results  Component Value Date   NA 141 12/11/2022   K 3.7 12/11/2022   CO2 25 12/11/2022   GLUCOSE 101 (H) 12/11/2022   BUN 11 12/11/2022   CREATININE 0.54 12/11/2022   BILITOT 0.5 12/09/2022   ALKPHOS 111 12/09/2022   AST 19 12/09/2022   ALT 21 12/09/2022   PROT 7.6 12/09/2022   ALBUMIN 4.2 12/09/2022   CALCIUM 8.0 (L) 12/11/2022   ANIONGAP 6 12/11/2022   GFR 73.92 10/19/2022   Lab Results  Component Value Date   CHOL 130 10/19/2022    Lab Results  Component Value Date   HDL 33.10 (L) 10/19/2022   Lab Results  Component Value Date   LDLCALC 75 10/19/2022   Lab Results  Component Value Date   TRIG 107.0 10/19/2022   Lab Results  Component Value Date   CHOLHDL 4 10/19/2022   Lab Results  Component Value Date   HGBA1C 6.4 04/18/2022       Assessment & Plan:   Problem List Items Addressed This Visit       Unprioritized   Hyperglycemia   Relevant Orders   CBC with Differential/Platelet   Comprehensive metabolic panel   Lipid panel   TSH   Hemoglobin A1c   Dog bite - Primary    Abx per orders Tdap given       Relevant Medications   cephALEXin (KEFLEX) 500 MG capsule   Other Relevant Orders   Tdap vaccine greater than or equal to 7yo IM (Completed)     Meds ordered this encounter  Medications   cephALEXin (KEFLEX) 500 MG capsule    Sig: Take 1 capsule (500 mg total) by mouth 2 (two) times daily.    Dispense:  20 capsule    Refill:  0    I, Donato Schultz, DO, personally preformed the services described in this documentation.  All medical record entries made by the scribe were at my direction and in my presence.  I have reviewed the chart and discharge instructions (if applicable) and agree that the record reflects my personal performance and is accurate and complete. 02/19/2023.  I,Mathew Stumpf,acting as a Neurosurgeon for [employer], DO.,have documented all relevant documentation on the behalf of Donato Schultz, DO,as directed by  Donato Schultz, DO while in the presence of Donato Schultz, DO.   Donato Schultz, DO

## 2023-02-20 LAB — LIPID PANEL
Cholesterol: 138 mg/dL (ref 0–200)
HDL: 45.6 mg/dL (ref >39.00)
LDL Cholesterol: 72 mg/dL (ref 0–99)
NonHDL: 92.73
Total CHOL/HDL Ratio: 3
Triglycerides: 103 mg/dL (ref 0.0–149.0)
VLDL: 20.6 mg/dL (ref 0.0–40.0)

## 2023-02-20 LAB — CBC WITH DIFFERENTIAL/PLATELET
Basophils Absolute: 0 10*3/uL (ref 0.0–0.1)
Basophils Relative: 0.6 % (ref 0.0–3.0)
Eosinophils Absolute: 0.2 10*3/uL (ref 0.0–0.7)
Eosinophils Relative: 2.8 % (ref 0.0–5.0)
HCT: 40.1 % (ref 36.0–46.0)
Hemoglobin: 13.6 g/dL (ref 12.0–15.0)
Lymphocytes Relative: 22 % (ref 12.0–46.0)
Lymphs Abs: 1.2 10*3/uL (ref 0.7–4.0)
MCHC: 33.8 g/dL (ref 30.0–36.0)
MCV: 91.5 fl (ref 78.0–100.0)
Monocytes Absolute: 0.4 10*3/uL (ref 0.1–1.0)
Monocytes Relative: 7.8 % (ref 3.0–12.0)
Neutro Abs: 3.7 10*3/uL (ref 1.4–7.7)
Neutrophils Relative %: 66.8 % (ref 43.0–77.0)
Platelets: 263 10*3/uL (ref 150.0–400.0)
RBC: 4.38 Mil/uL (ref 3.87–5.11)
RDW: 14.4 % (ref 11.5–15.5)
WBC: 5.6 10*3/uL (ref 4.0–10.5)

## 2023-02-20 LAB — TSH: TSH: 1.93 u[IU]/mL (ref 0.35–5.50)

## 2023-02-20 LAB — COMPREHENSIVE METABOLIC PANEL
ALT: 26 U/L (ref 0–35)
AST: 24 U/L (ref 0–37)
Albumin: 4.1 g/dL (ref 3.5–5.2)
Alkaline Phosphatase: 117 U/L (ref 39–117)
BUN: 16 mg/dL (ref 6–23)
CO2: 30 mEq/L (ref 19–32)
Calcium: 9.3 mg/dL (ref 8.4–10.5)
Chloride: 104 mEq/L (ref 96–112)
Creatinine, Ser: 0.75 mg/dL (ref 0.40–1.20)
GFR: 84.49 mL/min (ref >60.00)
Glucose, Bld: 93 mg/dL (ref 70–99)
Potassium: 3.9 mEq/L (ref 3.5–5.1)
Sodium: 141 mEq/L (ref 135–145)
Total Bilirubin: 0.5 mg/dL (ref 0.2–1.2)
Total Protein: 6.7 g/dL (ref 6.0–8.3)

## 2023-02-20 LAB — HEMOGLOBIN A1C: Hgb A1c MFr Bld: 5.9 % (ref 4.6–6.5)

## 2023-02-27 NOTE — Addendum Note (Signed)
Addended by: Seabron Spates R on: 02/27/2023 12:44 PM   Modules accepted: Level of Service

## 2023-03-01 ENCOUNTER — Other Ambulatory Visit: Payer: Self-pay | Admitting: *Deleted

## 2023-03-01 MED ORDER — FLECAINIDE ACETATE 100 MG PO TABS: 100.0000 mg | ORAL_TABLET | Freq: Two times a day (BID) | ORAL | 1 refills | Status: DC

## 2023-03-14 ENCOUNTER — Other Ambulatory Visit: Payer: Self-pay | Admitting: Family Medicine

## 2023-03-14 DIAGNOSIS — E785 Hyperlipidemia, unspecified: Secondary | ICD-10-CM

## 2023-03-24 ENCOUNTER — Other Ambulatory Visit: Payer: Self-pay | Admitting: Internal Medicine

## 2023-03-26 ENCOUNTER — Telehealth: Payer: Self-pay | Admitting: Cardiovascular Disease

## 2023-03-26 MED ORDER — DILTIAZEM HCL ER COATED BEADS 240 MG PO CP24: ORAL_CAPSULE | ORAL | 0 refills | Status: DC

## 2023-03-26 NOTE — Telephone Encounter (Signed)
Refill for Diltiazem 240 mg has been sent to pharmacy.

## 2023-03-26 NOTE — Telephone Encounter (Signed)
*  STAT* If patient is at the pharmacy, call can be transferred to refill team.   1. Which medications need to be refilled? (please list name of each medication and dose if known) diltiazem (CARDIZEM CD) 240 MG 24 hr capsule   2. Which pharmacy/location (including street and city if local pharmacy) is medication to be sent to? [id number] - [place], [place] - [address]   3. Do they need a 30 day or 90 day supply? 90 day   [facility] pills to get her thru, pts last pill will be Thursday.

## 2023-04-29 ENCOUNTER — Other Ambulatory Visit: Payer: Self-pay | Admitting: Gastroenterology

## 2023-05-08 ENCOUNTER — Other Ambulatory Visit: Payer: Self-pay | Admitting: Cardiovascular Disease

## 2023-05-08 DIAGNOSIS — I480 Paroxysmal atrial fibrillation: Secondary | ICD-10-CM

## 2023-05-08 NOTE — Telephone Encounter (Signed)
Eliquis 5mg  refill request received. Patient is 62 years old, weight-80.8kg, Crea-0.75 on 02/19/23, Diagnosis-Afib, and last seen by Alphonzo Severance on 12/25/22. Dose is appropriate based on dosing criteria. Will send in refill to requested pharmacy.

## 2023-05-28 ENCOUNTER — Telehealth: Payer: Self-pay | Admitting: *Deleted

## 2023-05-28 NOTE — Telephone Encounter (Signed)
Called patient and lvm of new appointment, requested call back to confirm.

## 2023-05-30 ENCOUNTER — Other Ambulatory Visit: Payer: Self-pay | Admitting: Cardiovascular Disease

## 2023-06-25 ENCOUNTER — Ambulatory Visit: Payer: BC Managed Care – PPO | Attending: Cardiovascular Disease | Admitting: Cardiovascular Disease

## 2023-06-25 ENCOUNTER — Encounter: Payer: Self-pay | Admitting: Family

## 2023-06-25 ENCOUNTER — Encounter: Payer: Self-pay | Admitting: Cardiovascular Disease

## 2023-06-25 ENCOUNTER — Inpatient Hospital Stay (HOSPITAL_BASED_OUTPATIENT_CLINIC_OR_DEPARTMENT_OTHER): Payer: BC Managed Care – PPO | Admitting: Family

## 2023-06-25 ENCOUNTER — Other Ambulatory Visit: Payer: Self-pay

## 2023-06-25 ENCOUNTER — Inpatient Hospital Stay: Payer: BC Managed Care – PPO | Attending: Family

## 2023-06-25 VITALS — BP 117/60 | HR 85 | Temp 97.8°F | Resp 19 | Ht 67.0 in | Wt 182.0 lb

## 2023-06-25 VITALS — BP 122/78 | HR 61 | Ht 67.0 in | Wt 181.4 lb

## 2023-06-25 DIAGNOSIS — D509 Iron deficiency anemia, unspecified: Secondary | ICD-10-CM | POA: Insufficient documentation

## 2023-06-25 DIAGNOSIS — I480 Paroxysmal atrial fibrillation: Secondary | ICD-10-CM | POA: Diagnosis not present

## 2023-06-25 LAB — CBC WITH DIFFERENTIAL (CANCER CENTER ONLY)
Abs Immature Granulocytes: 0.08 10*3/uL — ABNORMAL HIGH (ref 0.00–0.07)
Basophils Absolute: 0 10*3/uL (ref 0.0–0.1)
Basophils Relative: 1 %
Eosinophils Absolute: 0.1 10*3/uL (ref 0.0–0.5)
Eosinophils Relative: 2 %
HCT: 42 % (ref 36.0–46.0)
Hemoglobin: 13.7 g/dL (ref 12.0–15.0)
Immature Granulocytes: 1 %
Lymphocytes Relative: 23 %
Lymphs Abs: 1.4 10*3/uL (ref 0.7–4.0)
MCH: 30.3 pg (ref 26.0–34.0)
MCHC: 32.6 g/dL (ref 30.0–36.0)
MCV: 92.9 fL (ref 80.0–100.0)
Monocytes Absolute: 0.5 10*3/uL (ref 0.1–1.0)
Monocytes Relative: 8 %
Neutro Abs: 3.9 10*3/uL (ref 1.7–7.7)
Neutrophils Relative %: 65 %
Platelet Count: 271 10*3/uL (ref 150–400)
RBC: 4.52 MIL/uL (ref 3.87–5.11)
RDW: 13.2 % (ref 11.5–15.5)
WBC Count: 6 10*3/uL (ref 4.0–10.5)
nRBC: 0 % (ref 0.0–0.2)

## 2023-06-25 LAB — RETICULOCYTES
Immature Retic Fract: 8.6 % (ref 2.3–15.9)
RBC.: 4.5 MIL/uL (ref 3.87–5.11)
Retic Count, Absolute: 75.2 10*3/uL (ref 19.0–186.0)
Retic Ct Pct: 1.7 % (ref 0.4–3.1)

## 2023-06-25 LAB — FERRITIN: Ferritin: 104 ng/mL (ref 11–307)

## 2023-06-25 MED ORDER — FLECAINIDE ACETATE 50 MG PO TABS: 50.0000 mg | ORAL_TABLET | Freq: Two times a day (BID) | ORAL | 3 refills | Status: DC

## 2023-06-25 NOTE — Patient Instructions (Addendum)
Medication Instructions:  DECREASE Flecainide to 50 mg twice daily  *If you need a refill on your cardiac medications before your next appointment, please call your pharmacy*   Follow-Up: At [facility], you and your health needs are our priority.  As part of our continuing mission to provide you with exceptional heart care, we have created designated Provider Care Teams.  These Care Teams include your primary Cardiologist (physician) and Advanced Practice Providers (APPs -  Physician Assistants and Nurse Practitioners) who all work together to provide you with the care you need, when you need it.  We recommend signing up for the patient portal called "MyChart".  Sign up information is provided on this After Visit Summary.  MyChart is used to connect with patients for Virtual Visits (Telemedicine).  Patients are able to view lab/test results, encounter notes, upcoming appointments, etc.  Non-urgent messages can be sent to your provider as well.   To learn more about what you can do with MyChart, go to [email or web address].    Your next appointment:   6 month(s)  Provider:   You will follow up in the Atrial Fibrillation Clinic located at [facility]. Your provider will be: Clint R. Fenton, PA-C

## 2023-06-25 NOTE — Addendum Note (Signed)
Addended by: Sherle Poe R on: 06/25/2023 12:14 PM   Modules accepted: Orders

## 2023-06-25 NOTE — Progress Notes (Signed)
Hematology and Oncology Follow Up Visit  Alexandria [name] [id number] Aug 28, 1961 61 y.o. 06/25/2023   Principle Diagnosis:  Iron deficiency anemia   Current Therapy:        IV iron as indicated    Interim History:  Alexandria [name] is here today for follow-up. She is doing quite well and has no complaints at this time.  She has not noted any blood loss. No bruising or petechiae.  No fever, chills, n/v, cough, rash, dizziness, SOB, chest pain, abdominal pain or changes in bowel or bladder habits.  Occasional palpitations noted with atrial fib.  No swelling, tenderness, numbness or tingling in Alexandria [name] extremities.  No falls or syncope reported.  Appetite and hydration are good. Weight is stable at 182 lbs.   ECOG Performance Status: 0 - Asymptomatic  Medications:  Allergies as of 06/25/2023       Reactions   Peanut-containing Drug Products Swelling        Medication List        Accurate as of June 25, 2023  3:43 PM. If you have any questions, ask your nurse or doctor.          STOP taking these medications    cephALEXin 500 MG capsule Commonly known as: KEFLEX Stopped by: Eileen Stanford       TAKE these medications    acetaminophen 500 MG tablet Commonly known as: TYLENOL Take 500 mg by mouth every 6 (six) hours as needed for moderate pain or headache.   ALPRAZolam 0.5 MG tablet Commonly known as: XANAX TAKE 1/2 TO 1 TABLET BY MOUTH TWICE A DAY AS NEEDED What changed:  how much to take how to take this when to take this reasons to take this additional instructions   atorvastatin 40 MG tablet Commonly known as: LIPITOR Take 1 tablet (40 mg total) by mouth every evening.   diltiazem 240 MG 24 hr capsule Commonly known as: CARDIZEM CD TAKE 1 CAPSULE BY MOUTH EVERY DAY   diltiazem 30 MG tablet Commonly known as: CARDIZEM Take 30 mg by mouth daily as needed (afib).   Eliquis 5 MG Tabs tablet Generic drug: apixaban TAKE 1 TABLET BY MOUTH TWICE A DAY    famotidine 40 MG tablet Commonly known as: PEPCID TAKE 1 TABLET BY MOUTH EVERYDAY AT BEDTIME   flecainide 50 MG tablet Commonly known as: TAMBOCOR Take 1 tablet (50 mg total) by mouth 2 (two) times daily. What changed:  medication strength how much to take Changed by: Maurice Small   levothyroxine 137 MCG tablet Commonly known as: SYNTHROID Take 137 mcg by mouth daily before breakfast.        Allergies:  Allergies  Allergen Reactions   Peanut-Containing Drug Products Swelling    Past Medical History, Surgical history, Social history, and Family History were reviewed and updated.  Review of Systems: All other 10 point review of systems is negative.   Physical Exam:  height is 5\' 7"  (1.702 m) and weight is 182 lb (82.6 kg). Alexandria [name] oral temperature is 97.8 F (36.6 C). Alexandria [name] blood pressure is 117/60 and Alexandria [name] pulse is 85. Alexandria [name] respiration is 19 and oxygen saturation is 100%.   Wt Readings from Last 3 Encounters:  06/25/23 182 lb (82.6 kg)  06/25/23 181 lb 6.4 oz (82.3 kg)  02/19/23 178 lb 3.2 oz (80.8 kg)    Ocular: Sclerae unicteric, pupils equal, round and reactive to light Ear-nose-throat: Oropharynx clear, dentition fair Lymphatic: No cervical or supraclavicular adenopathy Lungs no  rales or rhonchi, good excursion bilaterally Heart regular rate and rhythm, no murmur appreciated Abd soft, nontender, positive bowel sounds MSK no focal spinal tenderness, no joint edema Neuro: non-focal, well-oriented, appropriate affect Breasts: Deferred   Lab Results  Component Value Date   WBC 6.0 06/25/2023   HGB 13.7 06/25/2023   HCT 42.0 06/25/2023   MCV 92.9 06/25/2023   PLT 271 06/25/2023   Lab Results  Component Value Date   FERRITIN 163 12/27/2022   IRON 80 12/27/2022   TIBC 241 (L) 12/27/2022   UIBC 161 12/27/2022   IRONPCTSAT 33 (H) 12/27/2022   Lab Results  Component Value Date   RETICCTPCT 1.7 06/25/2023   RBC 4.50 06/25/2023   RBC 4.52 06/25/2023   No  results found for: "KPAFRELGTCHN", "LAMBDASER", "KAPLAMBRATIO" No results found for: "IGGSERUM", "IGA", "IGMSERUM" No results found for: "TOTALPROTELP", "ALBUMINELP", "A1GS", "A2GS", "BETS", "BETA2SER", "GAMS", "MSPIKE", "SPEI"   Chemistry      Component Value Date/Time   NA 141 02/19/2023 1337   NA 141 01/14/2020 1608   K 3.9 02/19/2023 1337   CL 104 02/19/2023 1337   CO2 30 02/19/2023 1337   BUN 16 02/19/2023 1337   BUN 17 01/14/2020 1608   CREATININE 0.75 02/19/2023 1337   CREATININE 0.86 06/09/2022 1137   CREATININE 0.77 09/16/2021 1456      Component Value Date/Time   CALCIUM 9.3 02/19/2023 1337   ALKPHOS 117 02/19/2023 1337   AST 24 02/19/2023 1337   AST 13 (L) 06/09/2022 1137   ALT 26 02/19/2023 1337   ALT 17 06/09/2022 1137   BILITOT 0.5 02/19/2023 1337   BILITOT 0.4 06/09/2022 1137       Impression and Plan: Alexandria [name] is a very pleasant 62 yo caucasian female with history of iron deficiency anemia since childhood. This appears to have resolved with hernia repair.  Iron studies pending. We will replace if needed.  Follow-up in 1 year. If all is well at that time, we will release Alexandria [name] back to Alexandria [name] PCP.   Eileen Stanford, NP 8/19/20243:43 PM

## 2023-06-25 NOTE — Progress Notes (Signed)
  Electrophysiology Office Note:    Date:  06/25/2023   ID:  Alexandria [name], DOB [birth year], MRN [id number]  PCP:  Zola Button, Grayling Congress, DO   [facility] Providers Cardiologist:  None     Referring MD: Zola Button, Grayling Congress, *   History of Present Illness:    Alexandria [name] is a 62 y.o. female with a medical history significant for Graves' disease, stroke, who presents for electrophysiology follow-up.     She was diagnosed with atrial fibrillation in 2018 or 19 and underwent ablation by Dr. Johney Frame in October 2019.  She has had recurrence and is maintained on flecainide.  She was having some recurrence of A-fib on the 50 mg dose of flecainide, so it was increased to 100 mg.  To her knowledge, she has not had any A-fib in the past year.  She has an Scientist, physiological and a Kardia device to assist with arrhythmia monitoring.     This is my first time meeting the patient.  I reviewed prior notes from A-fib clinic and Dr. Johney Frame. Today, she reports that she is doing very well.  She has not had any symptoms to suggest recurrence of atrial fibrillation recently.  EKGs/Labs/Other Studies Reviewed Today:    Echocardiogram:  TTE Apr 05, 2018 LVEF 60 to 65%.  Mild mitral regurgitation.  Mild left atrial dilation.   Monitors:   Stress testing:   Advanced imaging:   Cardiac catherization   EKG:   EKG Interpretation Date/Time:  Monday June 25 2023 11:27:10 EDT Ventricular Rate:  61 PR Interval:  228 QRS Duration:  86 QT Interval:  434 QTC Calculation: 436 R Axis:   35  Text Interpretation: Sinus rhythm with 1st degree A-V block When compared with ECG of 25-Dec-2022 11:13, No significant change was found Confirmed by York Pellant [phone]) on 06/25/2023 12:02:07 PM     Physical Exam:    VS:  BP 122/78 (BP Location: Left Arm, Patient Position: Sitting, Cuff Size: Large)   Pulse 61   Ht 5\' 7"  (1.702 m)   Wt 181 lb 6.4 oz (82.3 kg)   LMP 01/26/2012   SpO2 98%    BMI 28.41 kg/m     Wt Readings from Last 3 Encounters:  06/25/23 181 lb 6.4 oz (82.3 kg)  02/19/23 178 lb 3.2 oz (80.8 kg)  12/27/22 169 lb (76.7 kg)     GEN: Well nourished, well developed in no acute distress CARDIAC: RRR, no murmurs, rubs, gallops RESPIRATORY:  Normal work of breathing MUSCULOSKELETAL: no edema    ASSESSMENT & PLAN:    Paroxysmal atrial fibrillation Status ablation by Dr. Johney Frame in 2019 Maintaining sinus rhythm on flecainide 100mg  She would like to try to go back on the 50 mg dose, and I think this is reasonable.  Secondary hypercoagulable state CHA2DS2-VASc score is 4 Continue Eliquis 5 mg twice daily  High risk medication-flecainide EKG shows no AV block Labs 02/19/2023 reviewed    Signed, Maurice Small, MD  06/25/2023 12:09 PM    [facility]

## 2023-06-26 ENCOUNTER — Ambulatory Visit: Payer: BC Managed Care – PPO | Admitting: Family

## 2023-06-26 ENCOUNTER — Other Ambulatory Visit: Payer: BC Managed Care – PPO

## 2023-06-26 LAB — IRON AND IRON BINDING CAPACITY (CC-WL,HP ONLY)
Iron: 75 ug/dL (ref 28–170)
Saturation Ratios: 30 % (ref 10.4–31.8)
TIBC: 253 ug/dL (ref 250–450)
UIBC: 178 ug/dL (ref 148–442)

## 2023-06-27 ENCOUNTER — Inpatient Hospital Stay: Payer: BC Managed Care – PPO

## 2023-06-27 ENCOUNTER — Ambulatory Visit: Payer: BC Managed Care – PPO | Admitting: Family

## 2023-08-09 ENCOUNTER — Ambulatory Visit: Payer: BC Managed Care – PPO | Admitting: Family Medicine

## 2023-08-09 ENCOUNTER — Telehealth: Payer: Self-pay | Admitting: Family Medicine

## 2023-08-09 ENCOUNTER — Encounter: Payer: Self-pay | Admitting: Family Medicine

## 2023-08-09 VITALS — BP 110/62 | HR 55 | Temp 98.6°F | Resp 18 | Ht 67.0 in | Wt 183.0 lb

## 2023-08-09 DIAGNOSIS — Z23 Encounter for immunization: Secondary | ICD-10-CM

## 2023-08-09 DIAGNOSIS — N951 Menopausal and female climacteric states: Secondary | ICD-10-CM | POA: Diagnosis not present

## 2023-08-09 DIAGNOSIS — R002 Palpitations: Secondary | ICD-10-CM

## 2023-08-09 MED ORDER — ALPRAZOLAM 0.5 MG PO TABS: ORAL_TABLET | ORAL | 1 refills | Status: DC

## 2023-08-09 MED ORDER — PREMARIN 0.625 MG/GM VA CREA
1.0000 | TOPICAL_CREAM | Freq: Every day | VAGINAL | 12 refills | Status: DC
Start: 2023-08-09 — End: 2023-08-10

## 2023-08-09 NOTE — Progress Notes (Signed)
   Established Patient Office Visit  Subjective   Patient ID: Alexandria [name], female    DOB: 1961/05/19  Age: 62 y.o. MRN: [id number]  Chief Complaint  Patient presents with   Vaginal Pain    Pt states having vaginal dryness and states pain during intercourse. Pt states sxs going on since starting menopause.     HPI    ROS    Objective:     BP 110/62 (BP Location: Left Arm, Patient Position: Sitting, Cuff Size: Normal)   Pulse (!) 55   Temp 98.6 F (37 C) (Oral)   Resp 18   Ht 5\' 7"  (1.702 m)   Wt 183 lb (83 kg)   LMP 01/26/2012   SpO2 96%   BMI 28.66 kg/m    Physical Exam   No results found for any visits on 08/09/23.    The ASCVD Risk score (Arnett DK, et al., 2019) failed to calculate for the following reasons:   The patient has a prior MI or stroke diagnosis    Assessment & Plan:   Problem List Items Addressed This Visit       Unprioritized   Need for influenza vaccination - Primary   Relevant Orders   Flu vaccine trivalent PF, 6mos and older(Flulaval,Afluria,Fluarix,Fluzone)   Other Visit Diagnoses     Palpitations           No follow-ups on file.    Donato Schultz, DO

## 2023-08-09 NOTE — Telephone Encounter (Signed)
Premarin not covered by plan. Preferred alternatives:   estradiol (ESTRACE) 0.1 MG/GM vaginal cream Estradiol (VAGIFEM) 10 MCG TABS vaginal tablet Estradiol (IMVEXXY MAINTENANCE PACK) 10 MCG INST

## 2023-08-10 ENCOUNTER — Other Ambulatory Visit: Payer: Self-pay | Admitting: Family Medicine

## 2023-08-10 DIAGNOSIS — N951 Menopausal and female climacteric states: Secondary | ICD-10-CM

## 2023-08-10 MED ORDER — ESTRADIOL 0.1 MG/GM VA CREA: 1.0000 | TOPICAL_CREAM | Freq: Every day | VAGINAL | 12 refills | Status: DC

## 2023-09-02 ENCOUNTER — Other Ambulatory Visit: Payer: Self-pay | Admitting: Cardiovascular Disease

## 2023-09-23 ENCOUNTER — Other Ambulatory Visit: Payer: Self-pay | Admitting: Family Medicine

## 2023-09-23 DIAGNOSIS — E785 Hyperlipidemia, unspecified: Secondary | ICD-10-CM

## 2023-10-22 ENCOUNTER — Ambulatory Visit (INDEPENDENT_AMBULATORY_CARE_PROVIDER_SITE_OTHER): Payer: BC Managed Care – PPO | Admitting: Family Medicine

## 2023-10-22 ENCOUNTER — Encounter: Payer: Self-pay | Admitting: Family Medicine

## 2023-10-22 VITALS — BP 98/70 | HR 57 | Temp 98.5°F | Resp 18 | Ht 67.0 in | Wt 186.2 lb

## 2023-10-22 DIAGNOSIS — Z0000 Encounter for general adult medical examination without abnormal findings: Secondary | ICD-10-CM

## 2023-10-22 DIAGNOSIS — I10 Essential (primary) hypertension: Secondary | ICD-10-CM

## 2023-10-22 DIAGNOSIS — E039 Hypothyroidism, unspecified: Secondary | ICD-10-CM | POA: Diagnosis not present

## 2023-10-22 DIAGNOSIS — D509 Iron deficiency anemia, unspecified: Secondary | ICD-10-CM

## 2023-10-22 DIAGNOSIS — E785 Hyperlipidemia, unspecified: Secondary | ICD-10-CM

## 2023-10-22 DIAGNOSIS — R739 Hyperglycemia, unspecified: Secondary | ICD-10-CM

## 2023-10-22 DIAGNOSIS — K449 Diaphragmatic hernia without obstruction or gangrene: Secondary | ICD-10-CM

## 2023-10-22 DIAGNOSIS — K219 Gastro-esophageal reflux disease without esophagitis: Secondary | ICD-10-CM

## 2023-10-22 DIAGNOSIS — I4891 Unspecified atrial fibrillation: Secondary | ICD-10-CM

## 2023-10-22 DIAGNOSIS — I480 Paroxysmal atrial fibrillation: Secondary | ICD-10-CM

## 2023-10-22 LAB — CBC WITH DIFFERENTIAL/PLATELET
Basophils Absolute: 0 10*3/uL (ref 0.0–0.1)
Basophils Relative: 0.8 % (ref 0.0–3.0)
Eosinophils Absolute: 0.1 10*3/uL (ref 0.0–0.7)
Eosinophils Relative: 1.6 % (ref 0.0–5.0)
HCT: 41.6 % (ref 36.0–46.0)
Hemoglobin: 13.7 g/dL (ref 12.0–15.0)
Lymphocytes Relative: 22 % (ref 12.0–46.0)
Lymphs Abs: 1 10*3/uL (ref 0.7–4.0)
MCHC: 32.9 g/dL (ref 30.0–36.0)
MCV: 92 fL (ref 78.0–100.0)
Monocytes Absolute: 0.4 10*3/uL (ref 0.1–1.0)
Monocytes Relative: 8.5 % (ref 3.0–12.0)
Neutro Abs: 3.2 10*3/uL (ref 1.4–7.7)
Neutrophils Relative %: 67.1 % (ref 43.0–77.0)
Platelets: 236 10*3/uL (ref 150.0–400.0)
RBC: 4.52 Mil/uL (ref 3.87–5.11)
RDW: 13.5 % (ref 11.5–15.5)
WBC: 4.8 10*3/uL (ref 4.0–10.5)

## 2023-10-22 LAB — COMPREHENSIVE METABOLIC PANEL
ALT: 16 U/L (ref 0–35)
AST: 14 U/L (ref 0–37)
Albumin: 3.9 g/dL (ref 3.5–5.2)
Alkaline Phosphatase: 107 U/L (ref 39–117)
BUN: 18 mg/dL (ref 6–23)
CO2: 28 meq/L (ref 19–32)
Calcium: 8.6 mg/dL (ref 8.4–10.5)
Chloride: 107 meq/L (ref 96–112)
Creatinine, Ser: 0.71 mg/dL (ref 0.40–1.20)
GFR: 89.81 mL/min (ref >60.00)
Glucose, Bld: 97 mg/dL (ref 70–99)
Potassium: 4.1 meq/L (ref 3.5–5.1)
Sodium: 142 meq/L (ref 135–145)
Total Bilirubin: 0.5 mg/dL (ref 0.2–1.2)
Total Protein: 6.2 g/dL (ref 6.0–8.3)

## 2023-10-22 LAB — TSH: TSH: 0.84 u[IU]/mL (ref 0.35–5.50)

## 2023-10-22 LAB — LIPID PANEL
Cholesterol: 118 mg/dL (ref 0–200)
HDL: 39.7 mg/dL (ref >39.00)
LDL Cholesterol: 63 mg/dL (ref 0–99)
NonHDL: 78.42
Total CHOL/HDL Ratio: 3
Triglycerides: 77 mg/dL (ref 0.0–149.0)
VLDL: 15.4 mg/dL (ref 0.0–40.0)

## 2023-10-22 LAB — HEMOGLOBIN A1C: Hgb A1c MFr Bld: 6 % (ref 4.6–6.5)

## 2023-10-22 NOTE — Assessment & Plan Note (Signed)
Check labs 

## 2023-10-22 NOTE — Assessment & Plan Note (Signed)
Per cardiology 

## 2023-10-22 NOTE — Progress Notes (Signed)
Established Patient Office Visit  Subjective   Patient ID: Alexandria [name], female    DOB: 07/13/1961  Age: 62 y.o. MRN: [id number]  Chief Complaint  Patient presents with   Annual Exam    Pt states fasting     HPI Discussed the use of AI scribe software for clinical note transcription with the patient, who gave verbal consent to proceed.  History of Present Illness   The patient, with a history of benign breast lesions and recent gynecological concerns, presents for a routine physical. They report a recent visit to an oncologist, where they underwent a Pap smear and mammogram, both of which returned normal results. They were prescribed estrogen cream for an unspecified gynecological issue, which they are currently using.  The patient also has a history of a breast biopsy, which was benign. They describe the procedure as extremely painful and distressing, causing anxiety around future mammograms.  Recently, the patient has been experiencing pain in their hands, specifically in the fingers and joints. They report a family history of arthritis and are concerned about the progression of their symptoms. They deny any numbness or tingling, but note that the pain is affecting their ability to play the piano. They have been using a hemp cream for relief, which they report as effective. The patient also mentions that they have been seeing a chiropractor for stress-related neck pain.      Patient Active Problem List   Diagnosis Date Noted   Dog bite 02/19/2023   Hypercoagulable state due to paroxysmal atrial fibrillation (HCC) 12/25/2022   SBO (small bowel obstruction) (HCC) 12/10/2022   Partial small bowel obstruction (HCC) 12/10/2022   Need for influenza vaccination 10/19/2022   Overweight (BMI 25.0-29.9) 10/19/2022   Preventative health care 10/19/2022   Family history of skin cancer 10/19/2022   S/P Nissen fundoplication (without gastrostomy tube) procedure 10/10/2022   Hiatal hernia  10/10/2022   IDA (iron deficiency anemia) 04/21/2022   Hypothyroidism 04/18/2022   Paroxysmal atrial fibrillation (HCC) 02/17/2022   Edema 06/27/2021   Hiatal hernia with gastroesophageal reflux 09/18/2018   Atrial fibrillation (HCC) 04/05/2018   Essential hypertension 04/05/2018   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   Low back pain 12/28/2015   Whiplash 12/28/2015   High risk sexual behavior 06/12/2013   Hyperthyroidism 02/07/2012   Hyperglycemia 11/27/2011   Benign neoplasm of skin 11/22/2010   LOW BACK PAIN, ACUTE 04/27/2010   LUMBAR RADICULOPATHY, LEFT 04/27/2010   CANDIDIASIS OF VULVA AND VAGINA 11/25/2009   HLD (hyperlipidemia) 06/07/2009   Anxiety 10/06/2008   GESTATIONAL DIABETES 10/06/2008   PALPITATIONS, HX OF 10/06/2008   Past Medical History:  Diagnosis Date   Anemia    hx of iron deficiency anemia (pt had iron infusions)   Anxiety    Atrial fibrillation (HCC)    Depression    GERD (gastroesophageal reflux disease)    Graves disease    Hiatal hernia    a. seen on CT 12/2016.   Hyperlipidemia    Hypertension    Hypothyroidism    pt had radioiodine treatment on thyroid   Low iron    pt received iron infusions 08/2022   NSVT (nonsustained ventricular tachycardia) (HCC)    Pulmonary nodules    a. seen on CT 12/2016.   Stroke [facility])    TIA (transient ischemic attack) 01/2017   a. tx at [facility].   Past Surgical History:  Procedure Laterality Date   ATRIAL FIBRILLATION ABLATION N/A 09/12/2018   Procedure: ATRIAL  FIBRILLATION ABLATION;  Surgeon: Hillis Range, MD;  Location: [facility] INVASIVE CV LAB;  Service: Cardiovascular;  Laterality: N/A;   BREAST LUMPECTOMY WITH RADIOACTIVE SEED LOCALIZATION Right 06/28/2022   Procedure: RIGHT BREAST LUMPECTOMY WITH RADIOACTIVE SEED LOCALIZATION;  Surgeon: Harriette Bouillon, MD;  Location: [facility] OR;  Service: General;  Laterality: Right;   CARPAL TUNNEL RELEASE Right 1991   DILATION AND CURETTAGE OF UTERUS  2018   INSERTION OF  MESH N/A 10/10/2022   Procedure: INSERTION OF MESH;  Surgeon: Axel Filler, MD;  Location: [facility] OR;  Service: General;  Laterality: N/A;   THYROID SURGERY  2012   radioactive iodine swallowed   WISDOM TOOTH EXTRACTION  1980   XI ROBOTIC ASSISTED HIATAL HERNIA REPAIR N/A 10/10/2022   Procedure: XI ROBOTIC ASSISTED HIATAL HERNIA REPAIR WITH MESH AND FUNDOPLICATION;  Surgeon: Axel Filler, MD;  Location: [facility] OR;  Service: General;  Laterality: N/A;   Social History   Tobacco Use   Smoking status: Never   Smokeless tobacco: Never   Tobacco comments:    Never smoke 12/25/22  Vaping Use   Vaping status: Never Used  Substance Use Topics   Alcohol use: Yes    Alcohol/week: 1.0 standard drink of alcohol    Types: 1 Glasses of wine per week    Comment: very occasionally, not every week   Drug use: No   Social History   Socioeconomic History   Marital status: Married    Spouse name: Not on file   Number of children: 4   Years of education: Not on file   Highest education level: Not on file  Occupational History   Occupation: Editor, commissioning    Comment: retired  Tobacco Use   Smoking status: Never   Smokeless tobacco: Never   Tobacco comments:    Never smoke 12/25/22  Vaping Use   Vaping status: Never Used  Substance and Sexual Activity   Alcohol use: Yes    Alcohol/week: 1.0 standard drink of alcohol    Types: 1 Glasses of wine per week    Comment: very occasionally, not every week   Drug use: No   Sexual activity: Yes  Other Topics Concern   Not on file  Social History Narrative   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   Social Drivers of Corporate investment banker Strain: Not on file  Food Insecurity: No Food Insecurity (12/10/2022)   Hunger Vital Sign    Worried About Running Out of Food in the Last Year: Never true    Ran Out of Food in the Last Year: Never true  Transportation Needs: No Transportation Needs (12/10/2022)   PRAPARE -  Administrator, Civil Service (Medical): No    Lack of Transportation (Non-Medical): No  Physical Activity: Not on file  Stress: Not on file  Social Connections: Not on file  Intimate Partner Violence: Not At Risk (12/10/2022)   Humiliation, Afraid, Rape, and Kick questionnaire    Fear of Current or Ex-Partner: No    Emotionally Abused: No    Physically Abused: No    Sexually Abused: No   Family Status  Relation Name Status   Mother  Alive   Father  Deceased   Brother  (Not Specified)   MGM  (Not Specified)   PGF  (Not Specified)   Oneal Grout  (Not Specified)   Neg Hx  (Not Specified)  No partnership data on file   Family History  Problem  Relation Age of Onset   Hyperlipidemia Mother    Dementia Mother    Coronary artery disease Father    Atrial fibrillation Father    Skin cancer Father    Colon polyps Brother        says he goes every year to have a colonoscopy   Diabetes Maternal Grandmother    Coronary artery disease Paternal Grandfather    Coronary artery disease Paternal Uncle    Colon cancer Neg Hx    Esophageal cancer Neg Hx    Allergies  Allergen Reactions   Peanut-Containing Drug Products Swelling      Review of Systems  Constitutional:  Negative for fever and malaise/fatigue.  HENT:  Negative for congestion.   Eyes:  Negative for blurred vision.  Respiratory:  Negative for cough and shortness of breath.   Cardiovascular:  Negative for chest pain, palpitations and leg swelling.  Gastrointestinal:  Negative for abdominal pain, blood in stool, nausea and vomiting.  Genitourinary:  Negative for dysuria and frequency.  Musculoskeletal:  Negative for back pain and falls.  Skin:  Negative for rash.  Neurological:  Negative for dizziness, loss of consciousness and headaches.  Endo/Heme/Allergies:  Negative for environmental allergies.  Psychiatric/Behavioral:  Negative for depression. The patient is not nervous/anxious.       Objective:     BP  98/70 (BP Location: Left Arm, Patient Position: Sitting, Cuff Size: Normal)   Pulse (!) 57   Temp 98.5 F (36.9 C) (Oral)   Resp 18   Ht 5\' 7"  (1.702 m)   Wt 186 lb 3.2 oz (84.5 kg)   LMP 01/26/2012   SpO2 95%   BMI 29.16 kg/m  BP Readings from Last 3 Encounters:  10/22/23 98/70  08/09/23 110/62  06/25/23 117/60   Wt Readings from Last 3 Encounters:  10/22/23 186 lb 3.2 oz (84.5 kg)  08/09/23 183 lb (83 kg)  06/25/23 182 lb (82.6 kg)   SpO2 Readings from Last 3 Encounters:  10/22/23 95%  08/09/23 96%  06/25/23 100%      Physical Exam Vitals and nursing note reviewed.  Constitutional:      General: She is not in acute distress.    Appearance: Normal appearance. She is well-developed.  HENT:     Head: Normocephalic and atraumatic.  Eyes:     General: No scleral icterus.       Right eye: No discharge.        Left eye: No discharge.  Cardiovascular:     Rate and Rhythm: Normal rate and regular rhythm.     Heart sounds: No murmur heard. Pulmonary:     Effort: Pulmonary effort is normal. No respiratory distress.     Breath sounds: Normal breath sounds.  Musculoskeletal:        General: Normal range of motion.     Cervical back: Normal range of motion and neck supple.     Right lower leg: No edema.     Left lower leg: No edema.  Skin:    General: Skin is warm and dry.  Neurological:     Mental Status: She is alert and oriented to person, place, and time.  Psychiatric:        Mood and Affect: Mood normal.        Behavior: Behavior normal.        Thought Content: Thought content normal.        Judgment: Judgment normal.      No results found for  any visits on 10/22/23.  Last CBC Lab Results  Component Value Date   WBC 6.0 06/25/2023   HGB 13.7 06/25/2023   HCT 42.0 06/25/2023   MCV 92.9 06/25/2023   MCH 30.3 06/25/2023   RDW 13.2 06/25/2023   PLT 271 06/25/2023   Last metabolic panel Lab Results  Component Value Date   GLUCOSE 93 02/19/2023   NA  141 02/19/2023   K 3.9 02/19/2023   CL 104 02/19/2023   CO2 30 02/19/2023   BUN 16 02/19/2023   CREATININE 0.75 02/19/2023   GFR 84.49 02/19/2023   CALCIUM 9.3 02/19/2023   PROT 6.7 02/19/2023   ALBUMIN 4.1 02/19/2023   BILITOT 0.5 02/19/2023   ALKPHOS 117 02/19/2023   AST 24 02/19/2023   ALT 26 02/19/2023   ANIONGAP 6 12/11/2022   Last lipids Lab Results  Component Value Date   CHOL 138 02/19/2023   HDL 45.60 02/19/2023   LDLCALC 72 02/19/2023   TRIG 103.0 02/19/2023   CHOLHDL 3 02/19/2023   Last hemoglobin A1c Lab Results  Component Value Date   HGBA1C 5.9 02/19/2023   Last thyroid functions Lab Results  Component Value Date   TSH 1.93 02/19/2023   T4TOTAL 14.0 (H) 05/19/2016   Last vitamin D Lab Results  Component Value Date   VD25OH 30.06 04/18/2022   Last vitamin B12 and Folate Lab Results  Component Value Date   VITAMINB12 374 04/18/2022      The ASCVD Risk score (Arnett DK, et al., 2019) failed to calculate for the following reasons:   Risk score cannot be calculated because patient has a medical history suggesting prior/existing ASCVD    Assessment & Plan:   Problem List Items Addressed This Visit       Unprioritized   IDA (iron deficiency anemia)   Relevant Orders   CBC with Differential/Platelet   Hypothyroidism   Relevant Orders   TSH   HLD (hyperlipidemia)   Relevant Orders   Comprehensive metabolic panel   Lipid panel   Hiatal hernia with gastroesophageal reflux   Atrial fibrillation (HCC)   Preventative health care - Primary   Ghm utd See AVS Health Maintenance  Topic Date Due   Cervical Cancer Screening (HPV/Pap Cotest)  05/07/2014   COVID-19 Vaccine (4 - 2024-25 season) 07/08/2023   MAMMOGRAM  01/10/2024   Colonoscopy  04/21/2031   DTaP/Tdap/Td (4 - Td or Tdap) 02/18/2033   INFLUENZA VACCINE  Completed   Hepatitis C Screening  Completed   HIV Screening  Completed   Zoster Vaccines- Shingrix  Completed   HPV VACCINES   Aged Out         Relevant Orders   CBC with Differential/Platelet   Comprehensive metabolic panel   Lipid panel   TSH   Paroxysmal atrial fibrillation (HCC)   Per cardiology      Hyperglycemia   Check labs       Relevant Orders   Hemoglobin A1c   Essential hypertension   Well controlled, no changes to meds. Encouraged heart healthy diet such as the DASH diet and exercise as tolerated.        Assessment and Plan    Menopausal Symptoms They are experiencing hot flashes and are currently using an estrogen cream as recommended by their oncologist. We discussed procedural options not covered by insurance, which cost approximately $1400. They prefer to continue with the cream to evaluate its effectiveness. We will continue using the estrogen cream and evaluate its effectiveness before  considering procedural options.  Arthritis They report pain and deformity in their fingers, likely due to arthritis, with a family history of similar symptoms. We discussed osteoarthritis and the role of x-rays in diagnosis. They prefer not to pursue further testing at this time and find relief with hemp cream and stretching exercises. We advised against using stress balls. We will consider an x-ray if symptoms worsen or they change their mind, continue using hemp cream for symptom relief, and avoid stress ball exercises; they should flex and stretch their hands regularly.  Tendonitis They have pain in their fingers, likely due to tendonitis, exacerbated by activities such as playing the piano. They find relief with stretching exercises. We will continue stretching exercises and monitor symptoms, considering further evaluation if the pain persists or worsens.  General Health Maintenance During their routine physical examination, they were up to date with vaccinations and screenings. We discussed the importance of the shingles vaccine and clarified misconceptions about shingles transmission. We also  discussed the pneumonia vaccine, due next year. We will perform a physical examination including heart and lung auscultation, order blood work, and administer the pneumonia vaccine next year.       No follow-ups on file.    Donato Schultz, DO

## 2023-10-22 NOTE — Assessment & Plan Note (Signed)
Well controlled, no changes to meds. Encouraged heart healthy diet such as the DASH diet and exercise as tolerated.  °

## 2023-10-22 NOTE — Assessment & Plan Note (Signed)
Ghm utd See AVS Health Maintenance  Topic Date Due   Cervical Cancer Screening (HPV/Pap Cotest)  05/07/2014   COVID-19 Vaccine (4 - 2024-25 season) 07/08/2023   MAMMOGRAM  01/10/2024   Colonoscopy  04/21/2031   DTaP/Tdap/Td (4 - Td or Tdap) 02/18/2033   INFLUENZA VACCINE  Completed   Hepatitis C Screening  Completed   HIV Screening  Completed   Zoster Vaccines- Shingrix  Completed   HPV VACCINES  Aged Out

## 2023-11-05 ENCOUNTER — Encounter: Payer: Self-pay | Admitting: Family

## 2023-12-01 ENCOUNTER — Other Ambulatory Visit: Payer: Self-pay | Admitting: Cardiovascular Disease

## 2023-12-01 DIAGNOSIS — I480 Paroxysmal atrial fibrillation: Secondary | ICD-10-CM

## 2023-12-03 NOTE — Telephone Encounter (Signed)
Prescription refill request for Eliquis received. Indication:afib Last office visit:8/24 Scr:0.71  12/24 Age: 63 Weight:84.5  kg  Prescription refilled

## 2023-12-28 ENCOUNTER — Ambulatory Visit (HOSPITAL_COMMUNITY): Payer: BC Managed Care – PPO | Admitting: Physician Assistant

## 2024-01-02 ENCOUNTER — Encounter: Payer: Self-pay | Admitting: Family

## 2024-01-02 ENCOUNTER — Ambulatory Visit (HOSPITAL_COMMUNITY)
Admission: RE | Admit: 2024-01-02 | Discharge: 2024-01-02 | Disposition: A | Discharge status: Home / Self Care | Payer: 59 | Source: Ambulatory Visit | Attending: Physician Assistant | Admitting: Physician Assistant

## 2024-01-02 ENCOUNTER — Encounter (HOSPITAL_COMMUNITY): Payer: Self-pay | Admitting: Physician Assistant

## 2024-01-02 VITALS — BP 128/90 | HR 59 | Ht 67.0 in | Wt 185.4 lb

## 2024-01-02 DIAGNOSIS — K449 Diaphragmatic hernia without obstruction or gangrene: Secondary | ICD-10-CM | POA: Diagnosis not present

## 2024-01-02 DIAGNOSIS — Z8673 Personal history of transient ischemic attack (TIA), and cerebral infarction without residual deficits: Secondary | ICD-10-CM | POA: Insufficient documentation

## 2024-01-02 DIAGNOSIS — I10 Essential (primary) hypertension: Secondary | ICD-10-CM | POA: Insufficient documentation

## 2024-01-02 DIAGNOSIS — Z7901 Long term (current) use of anticoagulants: Secondary | ICD-10-CM | POA: Insufficient documentation

## 2024-01-02 DIAGNOSIS — I480 Paroxysmal atrial fibrillation: Secondary | ICD-10-CM | POA: Insufficient documentation

## 2024-01-02 DIAGNOSIS — Z79899 Other long term (current) drug therapy: Secondary | ICD-10-CM | POA: Diagnosis not present

## 2024-01-02 DIAGNOSIS — E785 Hyperlipidemia, unspecified: Secondary | ICD-10-CM | POA: Insufficient documentation

## 2024-01-02 DIAGNOSIS — D6869 Other thrombophilia: Secondary | ICD-10-CM | POA: Diagnosis not present

## 2024-01-02 MED ORDER — APIXABAN 5 MG PO TABS: 5.0000 mg | ORAL_TABLET | Freq: Two times a day (BID) | ORAL | 3 refills | Status: AC

## 2024-01-02 NOTE — Patient Instructions (Signed)
Stop flecainide

## 2024-01-02 NOTE — Progress Notes (Signed)
 Primary Care Physician: Zola Button, Grayling Congress, DO Primary Cardiologist: none Primary Electrophysiologist: Dr Nelly Laurence  Referring Physician: Dr Ursula Alert is a 63 y.o. female with a history of Graves disease, hiatal hernia, HLD, HTN, CVA, atrial fibrillation who presents for follow up in the [facility]. The patient was initially diagnosed with atrial fibrillation 2018/2019 and underwent afib ablation 08/2018 with Dr Johney Frame. She has been maintained on flecainide. Patient is on Eliquis for stroke prevention.   Patient returns for follow up for atrial fibrillation and flecainide monitoring. She reports that she has done well since her last visit. She will rarely have a "flutter" in her chest lasting for ~1 minute but has not had any other symptoms. No bleeding issues on anticoagulation.   Today, he denies symptoms of chest pain, shortness of breath, orthopnea, PND, lower extremity edema, dizziness, presyncope, syncope, snoring, daytime somnolence, bleeding, or neurologic sequela. The patient is tolerating medications without difficulties and is otherwise without complaint today.    Atrial Fibrillation Risk Factors:  she does not have symptoms or diagnosis of sleep apnea. she does not have a history of rheumatic fever.   Atrial Fibrillation Management history:  Previous antiarrhythmic drugs: flecainide  Previous cardioversions: none Previous ablations: 2019 Anticoagulation history: Eliquis   Past Medical History:  Diagnosis Date   Anemia    hx of iron deficiency anemia (pt had iron infusions)   Anxiety    Atrial fibrillation (HCC)    Depression    GERD (gastroesophageal reflux disease)    Graves disease    Hiatal hernia    a. seen on CT 12/2016.   Hyperlipidemia    Hypertension    Hypothyroidism    pt had radioiodine treatment on thyroid   Low iron    pt received iron infusions 08/2022   NSVT (nonsustained ventricular tachycardia)  (HCC)    Pulmonary nodules    a. seen on CT 12/2016.   Stroke [facility])    TIA (transient ischemic attack) 01/2017   a. tx at [facility].    Current Outpatient Medications  Medication Sig Dispense Refill   acetaminophen (TYLENOL) 500 MG tablet Take 500 mg by mouth every 6 (six) hours as needed for moderate pain or headache.     ALPRAZolam (XANAX) 0.5 MG tablet TAKE 1/2 TO 1 TABLET BY MOUTH TWICE A DAY AS NEEDED 30 tablet 1   atorvastatin (LIPITOR) 40 MG tablet TAKE 1 TABLET BY MOUTH EVERY DAY IN THE EVENING 90 tablet 1   diltiazem (CARDIZEM CD) 240 MG 24 hr capsule TAKE 1 CAPSULE BY MOUTH EVERY DAY 90 capsule 2   diltiazem (CARDIZEM) 30 MG tablet Take 30 mg by mouth daily as needed (afib).     estradiol (ESTRACE) 0.1 MG/GM vaginal cream Place 1 Applicatorful vaginally at bedtime. 42.5 g 12   famotidine (PEPCID) 40 MG tablet TAKE 1 TABLET BY MOUTH EVERYDAY AT BEDTIME 90 tablet 1   levothyroxine (SYNTHROID) 137 MCG tablet Take 137 mcg by mouth daily before breakfast.     apixaban (ELIQUIS) 5 MG TABS tablet Take 1 tablet (5 mg total) by mouth 2 (two) times daily. 180 tablet 3   No current facility-administered medications for this encounter.    ROS- All systems are reviewed and negative except as per the HPI above.  Physical Exam: Vitals:   01/02/24 1415  BP: (!) 128/90  Pulse: (!) 59  Weight: 84.1 kg  Height: 5\' 7"  (1.702 m)  GEN: Well nourished, well developed in no acute distress CARDIAC: Regular rate and rhythm, no murmurs, rubs, gallops RESPIRATORY:  Clear to auscultation without rales, wheezing or rhonchi  ABDOMEN: Soft, non-tender, non-distended EXTREMITIES:  No edema; No deformity    Wt Readings from Last 3 Encounters:  01/02/24 84.1 kg  10/22/23 84.5 kg  08/09/23 83 kg    EKG today demonstrates  SB, 1st degree AV block Vent. rate 59 BPM PR interval 234 ms QRS duration 86 ms QT/QTcB 444/439 ms   Echo 04/05/18 demonstrated  - Left ventricle: The cavity size  was normal. Systolic function was    normal. The estimated ejection fraction was in the range of 60%    to 65%. Wall motion was normal; there were no regional wall    motion abnormalities. Left ventricular diastolic function    parameters were normal.  - Mitral valve: There was mild regurgitation.  - Left atrium: The atrium was mildly dilated.  - Atrial septum: No defect or patent foramen ovale was identified.  - Impressions: Appears to be some shadowing artifact in LA on    subcostal images.   Epic records are reviewed at length today  CHA2DS2-VASc Score = 4  The patient's score is based upon: CHF History: 0 HTN History: 1 Diabetes History: 0 Stroke History: 2 Vascular Disease History: 0 Age Score: 0 Gender Score: 1       ASSESSMENT AND PLAN: Paroxysmal Atrial Fibrillation (ICD10:  I48.0) The patient's CHA2DS2-VASc score is 4, indicating a 4.8% annual risk of stroke.   S/p afib ablation 2019 with Dr Johney Frame Patient appears to be maintaining SR. She would like a trial off AAD to see if she stays in SR. Will discontinue flecainide. If she has recurrent afib, could resume flecainide or consider repeat ablation.  Continue diltiazem 240 mg daily with 30 mg PRN q 4 hours for heart racing. Continue Eliquis 5 mg BID  Secondary Hypercoagulable State (ICD10:  D68.69) The patient is at significant risk for stroke/thromboembolism based upon her CHA2DS2-VASc Score of 4.  Continue Apixaban (Eliquis).   HTN Stable on current regimen   Follow up in the AF clinic in 3 months.    Jorja Loa PA-C Afib Clinic [facility] [address] [place], [place] [address] [phone] 01/02/2024 2:42 PM

## 2024-01-07 ENCOUNTER — Telehealth (HOSPITAL_COMMUNITY): Payer: Self-pay | Admitting: *Deleted

## 2024-01-07 MED ORDER — FLECAINIDE ACETATE 50 MG PO TABS: 50.0000 mg | ORAL_TABLET | Freq: Two times a day (BID) | ORAL | 3 refills | Status: DC

## 2024-01-07 NOTE — Telephone Encounter (Signed)
 Patient called in stating once she stopped flecainide she was back in AF 2 days later. She restarted flecainide 50mg  twice a day on Saturday and has returned to normal rhythm. Pt may wait til summer to possibly discuss repeat ablation at this time she would prefer to continue flecainide. Pt will call if issues arise prior to follow up.

## 2024-01-25 ENCOUNTER — Encounter: Payer: Self-pay | Admitting: Family Medicine

## 2024-01-25 ENCOUNTER — Ambulatory Visit: Admitting: Family Medicine

## 2024-01-25 VITALS — BP 116/60 | HR 62 | Temp 98.2°F | Resp 16 | Ht 67.0 in | Wt 184.0 lb

## 2024-01-25 DIAGNOSIS — R300 Dysuria: Secondary | ICD-10-CM | POA: Diagnosis not present

## 2024-01-25 DIAGNOSIS — N390 Urinary tract infection, site not specified: Secondary | ICD-10-CM | POA: Diagnosis not present

## 2024-01-25 LAB — POC URINALSYSI DIPSTICK (AUTOMATED)
Bilirubin, UA: NEGATIVE
Blood, UA: NEGATIVE
Glucose, UA: NEGATIVE
Nitrite, UA: NEGATIVE
Protein, UA: POSITIVE — AB
Spec Grav, UA: >=1.03 — AB (ref 1.010–1.025)
Urobilinogen, UA: 0.2 U/dL
pH, UA: 5 (ref 5.0–8.0)

## 2024-01-25 MED ORDER — PHENAZOPYRIDINE HCL 200 MG PO TABS: 200.0000 mg | ORAL_TABLET | Freq: Two times a day (BID) | ORAL | 0 refills | Status: AC

## 2024-01-25 MED ORDER — CEPHALEXIN 500 MG PO CAPS: 500.0000 mg | ORAL_CAPSULE | Freq: Two times a day (BID) | ORAL | 0 refills | Status: DC

## 2024-01-25 NOTE — Progress Notes (Signed)
 Established Patient Office Visit  Subjective   Patient ID: Alexandria [name], female    DOB: 02-Nov-1961  Age: 63 y.o. MRN: [id number]  Chief Complaint  Patient presents with   Dysuria    X1 day, pt states freq, burning, and unable to empty bladder    HPI Discussed the use of AI scribe software for clinical note transcription with the patient, who gave verbal consent to proceed.  History of Present Illness Alexandria [name] is a 63 year old female who presents with symptoms suggestive of a urinary tract infection.  She experiences a persistent urge to urinate immediately after voiding, accompanied by a sensation of incomplete bladder emptying and a mild burning sensation during urination. A urine test revealed the presence of leukocytes, and a urine culture has been sent for further analysis, with results expected in two days. She inquires about using over-the-counter AZO for symptom relief.  She recently injured her finger by crushing it in a garage door, resulting in swelling, bruising, and pain. She does not believe it is broken and has been managing the pain with an ice pack, without seeking further medical evaluation.  She discusses a wart that was frozen last Friday, which has since developed a fluid-filled blister. This is the third time she has had the wart treated.   Patient Active Problem List   Diagnosis Date Noted   Dog bite 02/19/2023   Hypercoagulable state due to paroxysmal atrial fibrillation (HCC) 12/25/2022   SBO (small bowel obstruction) (HCC) 12/10/2022   Partial small bowel obstruction (HCC) 12/10/2022   Need for influenza vaccination 10/19/2022   Overweight (BMI 25.0-29.9) 10/19/2022   Preventative health care 10/19/2022   Family history of skin cancer 10/19/2022   S/P Nissen fundoplication (without gastrostomy tube) procedure 10/10/2022   Hiatal hernia 10/10/2022   IDA (iron deficiency anemia) 04/21/2022   Hypothyroidism 04/18/2022   Paroxysmal atrial  fibrillation (HCC) 02/17/2022   Edema 06/27/2021   Hiatal hernia with gastroesophageal reflux 09/18/2018   Atrial fibrillation (HCC) 04/05/2018   Essential hypertension 04/05/2018   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   Low back pain 12/28/2015   Whiplash 12/28/2015   High risk sexual behavior 06/12/2013   Hyperthyroidism 02/07/2012   Hyperglycemia 11/27/2011   Benign neoplasm of skin 11/22/2010   LOW BACK PAIN, ACUTE 04/27/2010   LUMBAR RADICULOPATHY, LEFT 04/27/2010   CANDIDIASIS OF VULVA AND VAGINA 11/25/2009   HLD (hyperlipidemia) 06/07/2009   Anxiety 10/06/2008   GESTATIONAL DIABETES 10/06/2008   PALPITATIONS, HX OF 10/06/2008   Past Medical History:  Diagnosis Date   Anemia    hx of iron deficiency anemia (pt had iron infusions)   Anxiety    Atrial fibrillation (HCC)    Depression    GERD (gastroesophageal reflux disease)    Graves disease    Hiatal hernia    a. seen on CT 12/2016.   Hyperlipidemia    Hypertension    Hypothyroidism    pt had radioiodine treatment on thyroid   Low iron    pt received iron infusions 08/2022   NSVT (nonsustained ventricular tachycardia) (HCC)    Pulmonary nodules    a. seen on CT 12/2016.   Stroke [facility])    TIA (transient ischemic attack) 01/2017   a. tx at [facility].   Past Surgical History:  Procedure Laterality Date   ATRIAL FIBRILLATION ABLATION N/A 09/12/2018   Procedure: ATRIAL FIBRILLATION ABLATION;  Surgeon: Hillis Range, MD;  [facility];  Service: Cardiovascular;  Laterality: N/A;   BREAST LUMPECTOMY WITH RADIOACTIVE SEED LOCALIZATION Right 06/28/2022   Procedure: RIGHT BREAST LUMPECTOMY WITH RADIOACTIVE SEED LOCALIZATION;  Surgeon: Harriette Bouillon, MD;  Location: [facility] OR;  Service: General;  Laterality: Right;   CARPAL TUNNEL RELEASE Right 1991   DILATION AND CURETTAGE OF UTERUS  2018   INSERTION OF MESH N/A 10/10/2022   Procedure: INSERTION OF MESH;  Surgeon: Axel Filler, MD;  Location: [facility] OR;   Service: General;  Laterality: N/A;   THYROID SURGERY  2012   radioactive iodine swallowed   WISDOM TOOTH EXTRACTION  1980   XI ROBOTIC ASSISTED HIATAL HERNIA REPAIR N/A 10/10/2022   Procedure: XI ROBOTIC ASSISTED HIATAL HERNIA REPAIR WITH MESH AND FUNDOPLICATION;  Surgeon: Axel Filler, MD;  Location: [facility] OR;  Service: General;  Laterality: N/A;   Social History   Tobacco Use   Smoking status: Never   Smokeless tobacco: Never   Tobacco comments:    Never smoke 12/25/22  Vaping Use   Vaping status: Never Used  Substance Use Topics   Alcohol use: Yes    Alcohol/week: 1.0 standard drink of alcohol    Types: 1 Glasses of wine per week    Comment: very occasionally, not every week   Drug use: No   Social History   Socioeconomic History   Marital status: Married    Spouse name: Not on file   Number of children: 4   Years of education: Not on file   Highest education level: Not on file  Occupational History   Occupation: Editor, commissioning    Comment: retired  Tobacco Use   Smoking status: Never   Smokeless tobacco: Never   Tobacco comments:    Never smoke 12/25/22  Vaping Use   Vaping status: Never Used  Substance and Sexual Activity   Alcohol use: Yes    Alcohol/week: 1.0 standard drink of alcohol    Types: 1 Glasses of wine per week    Comment: very occasionally, not every week   Drug use: No   Sexual activity: Yes  Other Topics Concern   Not on file  Social History Narrative   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   Social Drivers of Corporate investment banker Strain: Not on file  Food Insecurity: No Food Insecurity (12/10/2022)   Hunger Vital Sign    Worried About Running Out of Food in the Last Year: Never true    Ran Out of Food in the Last Year: Never true  Transportation Needs: No Transportation Needs (12/10/2022)   PRAPARE - Administrator, Civil Service (Medical): No    Lack of Transportation (Non-Medical): No  Physical  Activity: Not on file  Stress: Not on file  Social Connections: Not on file  Intimate Partner Violence: Not At Risk (12/10/2022)   Humiliation, Afraid, Rape, and Kick questionnaire    Fear of Current or Ex-Partner: No    Emotionally Abused: No    Physically Abused: No    Sexually Abused: No   Family Status  Relation Name Status   Mother  Alive   Father  Deceased   Brother  (Not Specified)   MGM  (Not Specified)   PGF  (Not Specified)   Nutritional therapist  (Not Specified)   Neg Hx  (Not Specified)  No partnership data on file   Family History  Problem Relation Age of Onset   Hyperlipidemia Mother    Dementia Mother    Coronary  artery disease Father    Atrial fibrillation Father    Skin cancer Father    Colon polyps Brother        says he goes every year to have a colonoscopy   Diabetes Maternal Grandmother    Coronary artery disease Paternal Grandfather    Coronary artery disease Paternal Uncle    Colon cancer Neg Hx    Esophageal cancer Neg Hx    Allergies  Allergen Reactions   Peanut-Containing Drug Products Swelling      Review of Systems  Constitutional:  Negative for chills, fever and malaise/fatigue.  HENT:  Negative for congestion and hearing loss.   Eyes:  Negative for blurred vision and discharge.  Respiratory:  Negative for cough, sputum production and shortness of breath.   Cardiovascular:  Negative for chest pain, palpitations and leg swelling.  Gastrointestinal:  Negative for abdominal pain, blood in stool, constipation, diarrhea, heartburn, nausea and vomiting.  Genitourinary:  Negative for dysuria, frequency, hematuria and urgency.  Musculoskeletal:  Negative for back pain, falls and myalgias.  Skin:  Negative for rash.  Neurological:  Negative for dizziness, sensory change, loss of consciousness, weakness and headaches.  Endo/Heme/Allergies:  Negative for environmental allergies. Does not bruise/bleed easily.  Psychiatric/Behavioral:  Negative for depression  and suicidal ideas. The patient is not nervous/anxious and does not have insomnia.       Objective:     BP 116/60 (BP Location: Left Arm, Patient Position: Sitting, Cuff Size: Normal)   Pulse 62   Temp 98.2 F (36.8 C) (Oral)   Resp 16   Ht 5\' 7"  (1.702 m)   Wt 184 lb (83.5 kg)   LMP 01/26/2012   SpO2 97%   BMI 28.82 kg/m  BP Readings from Last 3 Encounters:  01/25/24 116/60  01/02/24 (!) 128/90  10/22/23 98/70   Wt Readings from Last 3 Encounters:  01/25/24 184 lb (83.5 kg)  01/02/24 185 lb 6.4 oz (84.1 kg)  10/22/23 186 lb 3.2 oz (84.5 kg)   SpO2 Readings from Last 3 Encounters:  01/25/24 97%  10/22/23 95%  08/09/23 96%      Physical Exam Vitals and nursing note reviewed.  Constitutional:      General: She is not in acute distress.    Appearance: Normal appearance. She is well-developed.  HENT:     Head: Normocephalic and atraumatic.  Eyes:     General: No scleral icterus.       Right eye: No discharge.        Left eye: No discharge.  Cardiovascular:     Rate and Rhythm: Normal rate and regular rhythm.     Heart sounds: No murmur heard. Pulmonary:     Effort: Pulmonary effort is normal. No respiratory distress.     Breath sounds: Normal breath sounds.  Musculoskeletal:        General: Normal range of motion.     Cervical back: Normal range of motion and neck supple.     Right lower leg: No edema.     Left lower leg: No edema.  Skin:    General: Skin is warm and dry.  Neurological:     Mental Status: She is alert and oriented to person, place, and time.  Psychiatric:        Mood and Affect: Mood normal.        Behavior: Behavior normal.        Thought Content: Thought content normal.  Judgment: Judgment normal.      Results for orders placed or performed in visit on 01/25/24  POCT Urinalysis Dipstick (Automated)  Result Value Ref Range   Color, UA yellow    Clarity, UA hazy    Glucose, UA Negative Negative   Bilirubin, UA negative     Ketones, UA trace    Spec Grav, UA >=1.030 (A) 1.010 - 1.025   Blood, UA negative    pH, UA 5.0 5.0 - 8.0   Protein, UA Positive (A) Negative   Urobilinogen, UA 0.2 0.2 or 1.0 E.U./dL   Nitrite, UA negative    Leukocytes, UA Large (3+) (A) Negative    Last CBC Lab Results  Component Value Date   WBC 4.8 10/22/2023   HGB 13.7 10/22/2023   HCT 41.6 10/22/2023   MCV 92.0 10/22/2023   MCH 30.3 06/25/2023   RDW 13.5 10/22/2023   PLT 236.0 10/22/2023   Last metabolic panel Lab Results  Component Value Date   GLUCOSE 97 10/22/2023   NA 142 10/22/2023   K 4.1 10/22/2023   CL 107 10/22/2023   CO2 28 10/22/2023   BUN 18 10/22/2023   CREATININE 0.71 10/22/2023   GFR 89.81 10/22/2023   CALCIUM 8.6 10/22/2023   PROT 6.2 10/22/2023   ALBUMIN 3.9 10/22/2023   BILITOT 0.5 10/22/2023   ALKPHOS 107 10/22/2023   AST 14 10/22/2023   ALT 16 10/22/2023   ANIONGAP 6 12/11/2022   Last lipids Lab Results  Component Value Date   CHOL 118 10/22/2023   HDL 39.70 10/22/2023   LDLCALC 63 10/22/2023   TRIG 77.0 10/22/2023   CHOLHDL 3 10/22/2023   Last hemoglobin A1c Lab Results  Component Value Date   HGBA1C 6.0 10/22/2023   Last thyroid functions Lab Results  Component Value Date   TSH 0.84 10/22/2023   T4TOTAL 14.0 (H) 05/19/2016      The ASCVD Risk score (Arnett DK, et al., 2019) failed to calculate for the following reasons:   Risk score cannot be calculated because patient has a medical history suggesting prior/existing ASCVD    Assessment & Plan:   Problem List Items Addressed This Visit   None Visit Diagnoses       Dysuria    -  Primary   Relevant Orders   POCT Urinalysis Dipstick (Automated) (Completed)   Urine Culture     Urinary tract infection without hematuria, site unspecified       Relevant Medications   phenazopyridine (PYRIDIUM) 200 MG tablet   cephALEXin (KEFLEX) 500 MG capsule     Assessment and Plan Assessment & Plan Urinary Tract Infection  (UTI)   She presents with symptoms consistent with a UTI, including frequent urination and a burning sensation. Urinalysis shows leukocytes but no nitrates. A urine culture is pending to confirm the diagnosis and identify specific bacteria. Empirical treatment with Keflex is initiated, with potential adjustments based on culture results. Informed consent was provided regarding Keflex, noting its coverage for most UTIs. Pyridium is prescribed for symptomatic relief, with information about possible urine discoloration. AZO is discussed as an over-the-counter alternative. She is instructed to monitor symptoms and contact the office if she persists or worsens. Follow-up on culture results will determine if antibiotic adjustments are necessary.  Finger Injury   She experienced recent trauma to her finger, resulting in significant bruising and swelling. A possible fracture is suspected but not confirmed. She reports pain and pressure but no severe pain indicative of a fracture.  The decision is made to manage conservatively without imaging, as treatment would remain unchanged. She is advised to use a splint for support, apply ice to reduce swelling, and monitor for changes in symptoms or increased pain.  Wart Treatment   The wart has been treated with cryotherapy multiple times and is currently blistered, which is a normal response. She is advised to allow the blister to resolve naturally and to return for further treatment if the wart does not resolve.  Follow-up   She is aware of the need to follow up on culture results and adjust treatment if necessary. She understands the process for accessing results and contacting the office if symptoms persist. Coverage by another provider is arranged during her absence for vacation.    No follow-ups on file.    Donato Schultz, DO

## 2024-01-27 LAB — URINE CULTURE
MICRO NUMBER:: 16232058
SPECIMEN QUALITY:: ADEQUATE

## 2024-02-01 ENCOUNTER — Encounter: Payer: Self-pay | Admitting: Family

## 2024-02-18 ENCOUNTER — Telehealth: Payer: Self-pay | Admitting: Gastroenterology

## 2024-02-18 NOTE — Telephone Encounter (Signed)
 Lets try Bentyl 10 mg p.o. twice daily #60, 4RF RG

## 2024-02-18 NOTE — Telephone Encounter (Signed)
 Requesting f/u call from nurse to discuss abdominal pain. Please advise.

## 2024-02-18 NOTE — Telephone Encounter (Signed)
 Pt stated that for the last week and a half that she has had episodes of  explosive diarrhea along with abdominal pain ( Mid upper abdomen)  and nausea ( no vomiting) . Pt stated that these episodes last for hours at a time. Pt stated that she has had three episodes in the last week and a half. Pt stated that she has tried OTC Imodium and maxes  out on the daily recommended dosage on the days  that she has these episodes.  Pt previously scheduled to see Dr. Venice Gillis on 02/27/2024 at 4:00 PM.  Pt requesting if something can be prescribed prior to office visit. Please review and advise

## 2024-02-19 ENCOUNTER — Other Ambulatory Visit: Payer: Self-pay

## 2024-02-19 DIAGNOSIS — R109 Unspecified abdominal pain: Secondary | ICD-10-CM

## 2024-02-19 MED ORDER — DICYCLOMINE HCL 10 MG PO CAPS: 10.0000 mg | ORAL_CAPSULE | Freq: Two times a day (BID) | ORAL | 4 refills | Status: DC

## 2024-02-19 NOTE — Telephone Encounter (Signed)
 Pt made aware of Dr. Venice Gillis recommendations:  Prescription send to pharmacy: Pt made aware. Pt verbalized understanding with all questions answered.

## 2024-02-27 ENCOUNTER — Ambulatory Visit: Admitting: Gastroenterology

## 2024-02-27 ENCOUNTER — Encounter: Payer: Self-pay | Admitting: Gastroenterology

## 2024-02-27 VITALS — BP 118/68 | HR 55 | Ht 67.0 in | Wt 180.0 lb

## 2024-02-27 DIAGNOSIS — K2100 Gastro-esophageal reflux disease with esophagitis, without bleeding: Secondary | ICD-10-CM

## 2024-02-27 DIAGNOSIS — Z7901 Long term (current) use of anticoagulants: Secondary | ICD-10-CM

## 2024-02-27 DIAGNOSIS — I4891 Unspecified atrial fibrillation: Secondary | ICD-10-CM | POA: Diagnosis not present

## 2024-02-27 DIAGNOSIS — K219 Gastro-esophageal reflux disease without esophagitis: Secondary | ICD-10-CM | POA: Diagnosis not present

## 2024-02-27 DIAGNOSIS — R197 Diarrhea, unspecified: Secondary | ICD-10-CM

## 2024-02-27 DIAGNOSIS — R109 Unspecified abdominal pain: Secondary | ICD-10-CM

## 2024-02-27 DIAGNOSIS — K449 Diaphragmatic hernia without obstruction or gangrene: Secondary | ICD-10-CM

## 2024-02-27 NOTE — Progress Notes (Signed)
 Chief Complaint: fu  Referring Provider:  Crecencio Dodge, Candida Chalk, *      ASSESSMENT AND PLAN;   #1. GERD with HH s/p Nissens fundoplication 10/2022  #2. A fib on eliquis /flecanide  #3. CRC screening. Next colon due 04/2031  #4. Episodic diarrhea with occ abdo pain. ?etiology. R/O C diff since she has been on recent A/Bs.   Plan: -Stool studies- GI pathogens including C. Difficile (if any further diarrhea) -Decrease bentyl  10mg  every day x 1 week, then PRN -Encouraged probiotics. -Minimize antibiotics. -If still with problems, CTE to r/o PSBO (doubt it)   HPI:    Alexandria [name] is a 63 y.o. female  For FU-she does feel much better Discussed the use of AI scribe software for clinical note transcription with the patient, who gave verbal consent to proceed.  History of Present Illness Alexandria [name] is a 63 year old female who presents with recurrent episodes of severe diarrhea.  She underwent a hiatal hernia repair with fundoplication in December 2023. Postoperatively, she experienced an ileus vs pSBO (favor ileus per surgery), requiring hospitalization. Since Sx, she has had difficulty vomiting and burping, leading to bloating.  She has been experiencing severe episodes of diarrhea, described as 'like doing a colonoscopy prep,' occurring four times over the past two and a half weeks. These episodes are characterized by liquid stools with mucus, predominantly at night, waking her up between 2 and 3 AM. She experiences cramping and bloating, with pain starting in the stomach and moving to the intestines. She has tried to identify dietary triggers but has not found any specific foods that exacerbate her symptoms. Imodium was ineffective, but dicyclomine  has been used twice daily to manage symptoms.  She has done well with dicyclomine .  To some extent she does attribute her symptoms to stress.  Her past medical history includes atrial fibrillation, currently managed with  flecainide . She attempted to discontinue the medication but had to resume due to recurrence of symptoms. She also reports a history of UTIs, with the most recent episode treated with antibiotics. Her diarrhea began after completing the antibiotic course. No blood in stools but significant cramping and occasional incontinence during sleep.  Her colonoscopy in June 2022 was unremarkable except for the removal of three small hyperplastic polyps. She has a history of a benign breast mass removal in August 2023.  She is cautious with her diet, avoiding fast food, soda, and caffeine due to her heart condition. She consumes cheese and baked potatoes without issue and has been incorporating yogurt into her diet for its probiotic benefits.  Nl CBC, CMP.    Past GI WU:  CT AP 12/10/2022 1. Question developing early versus partial small bowel obstruction with query possible transition point within the right lower abdomen. Associated irregular bowel wall thickening at the transition point the could represent a true lesion/stricture versus peristalsis. Differential diagnosis includes ileus. Recommend attention on follow-up. 2. Calcified and noncalcified intramural uterine fibroid. 3. Skin Aortic Atherosclerosis (ICD10-I70.0).  EGD 04/20/2021 -Moderate hiatal hernia. - A single gastric polyp. Resected and retrieved. - Gastritis. Biopsied. -Bx: neg for celiac, gastric negative for HP, negative esophageal biopsies, gastric polyp was hyperplastic.  Colon 04/20/2021 - Three 6 mm polyps in the rectum and in the mid sigmoid colon, removed with a cold snare. Resected and retrieved. - Non- bleeding internal hemorrhoids. - The examined portion of the ileum was normal. -Bx: Hyperplastic. -Repeat in 10 years.  Earlier, if any problems. Past Medical  History:  Diagnosis Date   Anemia    hx of iron  deficiency anemia (pt had iron  infusions)   Anxiety    Atrial fibrillation (HCC)    Depression    GERD  (gastroesophageal reflux disease)    Graves disease    Hiatal hernia    a. seen on CT 12/2016.   Hyperlipidemia    Hypertension    Hypothyroidism    pt had radioiodine treatment on thyroid    Low iron     pt received iron  infusions 08/2022   NSVT (nonsustained ventricular tachycardia) (HCC)    Pulmonary nodules    a. seen on CT 12/2016.   Stroke [facility])    TIA (transient ischemic attack) 01/2017   a. tx at [facility].    Past Surgical History:  Procedure Laterality Date   ATRIAL FIBRILLATION ABLATION N/A 09/12/2018   Procedure: ATRIAL FIBRILLATION ABLATION;  Surgeon: Jolly Needle, MD;  [facility];  Service: Cardiovascular;  Laterality: N/A;   BREAST LUMPECTOMY WITH RADIOACTIVE SEED LOCALIZATION Right 06/28/2022   Procedure: RIGHT BREAST LUMPECTOMY WITH RADIOACTIVE SEED LOCALIZATION;  Surgeon: Sim Dryer, MD;  Location: [facility] OR;  Service: General;  Laterality: Right;   CARPAL TUNNEL RELEASE Right 1991   DILATION AND CURETTAGE OF UTERUS  2018   INSERTION OF MESH N/A 10/10/2022   Procedure: INSERTION OF MESH;  Surgeon: Shela Derby, MD;  Location: [facility] OR;  Service: General;  Laterality: N/A;   THYROID  SURGERY  2012   radioactive iodine swallowed   WISDOM TOOTH EXTRACTION  1980   XI ROBOTIC ASSISTED HIATAL HERNIA REPAIR N/A 10/10/2022   Procedure: XI ROBOTIC ASSISTED HIATAL HERNIA REPAIR WITH MESH AND FUNDOPLICATION;  Surgeon: Shela Derby, MD;  Location: [facility] OR;  Service: General;  Laterality: N/A;    Family History  Problem Relation Age of Onset   Hyperlipidemia Mother    Dementia Mother    Coronary artery disease Father    Atrial fibrillation Father    Skin cancer Father    Colon polyps Brother        says he goes every year to have a colonoscopy   Diabetes Maternal Grandmother    Coronary artery disease Paternal Grandfather    Coronary artery disease Paternal Uncle    Colon cancer Neg Hx    Esophageal cancer Neg Hx     Social History   Tobacco  Use   Smoking status: Never   Smokeless tobacco: Never   Tobacco comments:    Never smoke 12/25/22  Vaping Use   Vaping status: Never Used  Substance Use Topics   Alcohol use: Yes    Alcohol/week: 1.0 standard drink of alcohol    Types: 1 Glasses of wine per week    Comment: very occasionally, not every week   Drug use: No    Current Outpatient Medications  Medication Sig Dispense Refill   acetaminophen  (TYLENOL ) 500 MG tablet Take 500 mg by mouth every 6 (six) hours as needed for moderate pain or headache.     ALPRAZolam  (XANAX ) 0.5 MG tablet TAKE 1/2 TO 1 TABLET BY MOUTH TWICE A DAY AS NEEDED 30 tablet 1   apixaban  (ELIQUIS ) 5 MG TABS tablet Take 1 tablet (5 mg total) by mouth 2 (two) times daily. 180 tablet 3   atorvastatin  (LIPITOR) 40 MG tablet TAKE 1 TABLET BY MOUTH EVERY DAY IN THE EVENING 90 tablet 1   dicyclomine  (BENTYL ) 10 MG capsule Take 1 capsule (10 mg total) by mouth 2 (two)  times daily. 60 capsule 4   diltiazem  (CARDIZEM  CD) 240 MG 24 hr capsule TAKE 1 CAPSULE BY MOUTH EVERY DAY 90 capsule 2   diltiazem  (CARDIZEM ) 30 MG tablet Take 30 mg by mouth daily as needed (afib).     estradiol  (ESTRACE ) 0.1 MG/GM vaginal cream Place 1 Applicatorful vaginally at bedtime. 42.5 g 12   famotidine  (PEPCID ) 40 MG tablet TAKE 1 TABLET BY MOUTH EVERYDAY AT BEDTIME 90 tablet 1   flecainide  (TAMBOCOR ) 50 MG tablet Take 1 tablet (50 mg total) by mouth 2 (two) times daily. 60 tablet 3   levothyroxine  (SYNTHROID ) 137 MCG tablet Take 137 mcg by mouth daily before breakfast.     cephALEXin  (KEFLEX ) 500 MG capsule Take 1 capsule (500 mg total) by mouth 2 (two) times daily. (Patient not taking: Reported on 02/27/2024) 14 capsule 0   No current facility-administered medications for this visit.    Allergies  Allergen Reactions   Peanut-Containing Drug Products Swelling    Review of Systems:  neg     Physical Exam:    BP 118/68   Pulse (!) 55   Ht 5\' 7"  (1.702 m)   Wt 180 lb (81.6  kg)   LMP 01/26/2012   BMI 28.19 kg/m  Wt Readings from Last 3 Encounters:  02/27/24 180 lb (81.6 kg)  01/25/24 184 lb (83.5 kg)  01/02/24 185 lb 6.4 oz (84.1 kg)   Constitutional:  Well-developed, in no acute distress. Psychiatric: Normal mood and affect. Behavior is normal. HEENT: Pupils normal.  Conjunctivae are normal. No scleral icterus. Cardiovascular: Normal rate, regular rhythm. No edema Pulmonary/chest: Effort normal and breath sounds normal. No wheezing, rales or rhonchi. Abdominal: Soft, nondistended. Nontender. Bowel sounds active throughout. There are no masses palpable. No hepatomegaly. Rectal: Deferred Neurological: Alert and oriented to person place and time. Skin: Skin is warm and dry. No rashes noted.  Data Reviewed: I have personally reviewed following labs and imaging studies  CBC:    Latest Ref Rng & Units 10/22/2023   11:19 AM 06/25/2023    2:52 PM 02/19/2023    1:37 PM  CBC  WBC 4.0 - 10.5 K/uL 4.8  6.0  5.6   Hemoglobin 12.0 - 15.0 g/dL 16.1  09.6  04.5   Hematocrit 36.0 - 46.0 % 41.6  42.0  40.1   Platelets 150.0 - 400.0 K/uL 236.0  271  263.0     CMP:    Latest Ref Rng & Units 10/22/2023   11:19 AM 02/19/2023    1:37 PM 12/11/2022    4:19 AM  CMP  Glucose 70 - 99 mg/dL 97  93  409   BUN 6 - 23 mg/dL 18  16  11    Creatinine 0.40 - 1.20 mg/dL 8.11  9.14  7.82   Sodium 135 - 145 mEq/L 142  141  141   Potassium 3.5 - 5.1 mEq/L 4.1  3.9  3.7   Chloride 96 - 112 mEq/L 107  104  110   CO2 19 - 32 mEq/L 28  30  25    Calcium  8.4 - 10.5 mg/dL 8.6  9.3  8.0   Total Protein 6.0 - 8.3 g/dL 6.2  6.7    Total Bilirubin 0.2 - 1.2 mg/dL 0.5  0.5    Alkaline Phos 39 - 117 U/L 107  117    AST 0 - 37 U/L 14  24    ALT 0 - 35 U/L 16  26  Magnus Schuller, MD 02/27/2024, 4:23 PM  Cc: Crecencio Dodge, Candida Chalk, *

## 2024-02-27 NOTE — Patient Instructions (Addendum)
 _______________________________________________________  If your blood pressure at your visit was 140/90 or greater, please contact your primary care physician to follow up on this.  If you are age 65 or younger, your body mass index should be between 19-25. Your Body mass index is 28.19 kg/m. If this is out of the aformentioned range listed, please consider follow up with your Primary Care Provider.  ________________________________________________________  Your provider has ordered "Diatherix" stool testing for you. You have received a kit from our office today containing all necessary supplies to complete this test. Please carefully read the stool collection instructions provided in the kit before opening the accompanying materials. In addition, be sure there is a label providing your full name and date of birth on the "puritan opti-swab" tube that is supplied in the kit (if you do not see a label with this information on your test tube, please make us  aware before test collection!). After completing the test, you should secure the purtian tube into the specimen biohazard bag. The [facility] E-Req sheet (including date and time of specimen collection) should be placed into the outside pocket of the specimen biohazard bag and returned to the [facility] lab (basement floor of [facility]) within 3 days of collection. Please make sure to give the specimen to a staff member at the lab. DO NOT leave the specimen on the counter.   If the specimen date and time (can be found in the upper right boxed portion of the sheet) are not filled out on the E-Req sheet, the test will NOT be performed.    -Decrease bentyl  10mg  every day x 1 week, then use as needed.   Due to recent changes in healthcare laws, you may see the results of your imaging and laboratory studies on MyChart before your provider has had a chance to review them.  We understand that in some cases there may be results  that are confusing or concerning to you. Not all laboratory results come back in the same time frame and the provider may be waiting for multiple results in order to interpret others.  Please give us  48 hours in order for your provider to thoroughly review all the results before contacting the office for clarification of your results.   The  GI providers would like to encourage you to use MYCHART to communicate with providers for non-urgent requests or questions.  Due to long hold times on the telephone, sending your provider a message by [facility] may be a faster and more efficient way to get a response.  Please allow 48 business hours for a response.  Please remember that this is for non-urgent requests.  _______________________________________________________  Thank you for entrusting me with your care and choosing [facility].  Dr Venice Gillis

## 2024-03-27 ENCOUNTER — Ambulatory Visit (HOSPITAL_COMMUNITY): Payer: 59 | Admitting: Physician Assistant

## 2024-04-02 ENCOUNTER — Other Ambulatory Visit: Payer: Self-pay | Admitting: Family Medicine

## 2024-04-02 DIAGNOSIS — E785 Hyperlipidemia, unspecified: Secondary | ICD-10-CM

## 2024-04-16 DIAGNOSIS — E890 Postprocedural hypothyroidism: Secondary | ICD-10-CM | POA: Diagnosis not present

## 2024-04-20 ENCOUNTER — Encounter: Payer: Self-pay | Admitting: Family

## 2024-04-20 ENCOUNTER — Ambulatory Visit
Admission: RE | Admit: 2024-04-20 | Discharge: 2024-04-20 | Disposition: A | Discharge status: Home / Self Care | Source: Ambulatory Visit | Attending: Internal Medicine | Admitting: Internal Medicine

## 2024-04-20 ENCOUNTER — Other Ambulatory Visit: Payer: Self-pay

## 2024-04-20 VITALS — BP 107/63 | HR 59 | Temp 97.9°F | Resp 16

## 2024-04-20 DIAGNOSIS — R81 Glycosuria: Secondary | ICD-10-CM

## 2024-04-20 DIAGNOSIS — N390 Urinary tract infection, site not specified: Secondary | ICD-10-CM | POA: Diagnosis not present

## 2024-04-20 LAB — POCT URINALYSIS DIP (MANUAL ENTRY)
Glucose, UA: 250 mg/dL — AB
Nitrite, UA: POSITIVE — AB
Protein Ur, POC: 100 mg/dL — AB
Spec Grav, UA: <=1.005 — AB (ref 1.010–1.025)
Urobilinogen, UA: 4 U/dL — AB
pH, UA: 5 (ref 5.0–8.0)

## 2024-04-20 LAB — POCT FASTING CBG KUC MANUAL ENTRY: POCT Glucose (KUC): 109 mg/dL — AB (ref 70–99)

## 2024-04-20 MED ORDER — NITROFURANTOIN MONOHYD MACRO 100 MG PO CAPS: 100.0000 mg | ORAL_CAPSULE | Freq: Two times a day (BID) | ORAL | 0 refills | Status: DC

## 2024-04-20 NOTE — Discharge Instructions (Addendum)
 Urinalysis done today is consistent with a urinary tract infection.  After reviewing your urine culture that was done in March, we will start you on Macrobid 100 mg twice daily for 5 days.  Drink plenty of water and stay hydrated.  Due to the glucose in your urine we did check a fingerstick blood glucose today.  This was 109 which is with in normal range for a non-fasting blood glucose.  Recommend following up with your primary care physician regarding this.

## 2024-04-20 NOTE — ED Provider Notes (Addendum)
 Alexandria [name]    CSN: [id number] Arrival date & time: 04/20/24  1341      History   Chief Complaint Chief Complaint  Patient presents with   Dysuria    HPI Alexandria [name] is a 63 y.o. female.   63 year old female who presents urgent [name] with complaints of dysuria, urgency, frequency and pressure in the lower abdomen.  This started last night.  Her symptoms are fairly severe.  She had a urinary tract infection in March that she was treated with cephalexin  for.  They did a urine culture at that time.  She did ask why she would have back-to-back urinary tract infections.  She does not have any history of this.  She does not have diabetes per the patient.  She did take Azo and a cranberry supplement yesterday to try and help her symptoms.   Dysuria Associated symptoms: no abdominal pain, no fever and no vomiting     Past Medical History:  Diagnosis Date   Anemia    hx of iron  deficiency anemia (pt had iron  infusions)   Anxiety    Atrial fibrillation (HCC)    Depression    GERD (gastroesophageal reflux disease)    Graves disease    Hiatal hernia    a. seen on CT 12/2016.   Hyperlipidemia    Hypertension    Hypothyroidism    pt had radioiodine treatment on thyroid    Low iron     pt received iron  infusions 08/2022   NSVT (nonsustained ventricular tachycardia) (HCC)    Pulmonary nodules    a. seen on CT 12/2016.   Stroke [facility])    TIA (transient ischemic attack) 01/2017   a. tx at [facility].    Patient Active Problem List   Diagnosis Date Noted   Dog bite 02/19/2023   Hypercoagulable state due to paroxysmal atrial fibrillation (HCC) 12/25/2022   SBO (small bowel obstruction) (HCC) 12/10/2022   Partial small bowel obstruction (HCC) 12/10/2022   Need for influenza vaccination 10/19/2022   Overweight (BMI 25.0-29.9) 10/19/2022   Preventative health [name] 10/19/2022   Family history of skin cancer 10/19/2022   S/P Nissen fundoplication (without gastrostomy  tube) procedure 10/10/2022   Hiatal hernia 10/10/2022   IDA (iron  deficiency anemia) 04/21/2022   Hypothyroidism 04/18/2022   Paroxysmal atrial fibrillation (HCC) 02/17/2022   Edema 06/27/2021   Hiatal hernia with gastroesophageal reflux 09/18/2018   Atrial fibrillation (HCC) 04/05/2018   Essential hypertension 04/05/2018   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   Low back pain 12/28/2015   Whiplash 12/28/2015   High risk sexual behavior 06/12/2013   Hyperthyroidism 02/07/2012   Hyperglycemia 11/27/2011   Benign neoplasm of skin 11/22/2010   LOW BACK PAIN, ACUTE 04/27/2010   LUMBAR RADICULOPATHY, LEFT 04/27/2010   CANDIDIASIS OF VULVA AND VAGINA 11/25/2009   HLD (hyperlipidemia) 06/07/2009   Anxiety 10/06/2008   GESTATIONAL DIABETES 10/06/2008   PALPITATIONS, HX OF 10/06/2008    Past Surgical History:  Procedure Laterality Date   ATRIAL FIBRILLATION ABLATION N/A 09/12/2018   Procedure: ATRIAL FIBRILLATION ABLATION;  Surgeon: Jolly Needle, MD;  Location: [facility] INVASIVE CV LAB;  Service: Cardiovascular;  Laterality: N/A;   BREAST LUMPECTOMY WITH RADIOACTIVE SEED LOCALIZATION Right 06/28/2022   Procedure: RIGHT BREAST LUMPECTOMY WITH RADIOACTIVE SEED LOCALIZATION;  Surgeon: Sim Dryer, MD;  Location: [facility] OR;  Service: General;  Laterality: Right;   CARPAL TUNNEL RELEASE Right 1991   DILATION AND CURETTAGE OF UTERUS  2018   INSERTION OF MESH N/A 10/10/2022  Procedure: INSERTION OF MESH;  Surgeon: Shela Derby, MD;  Location: [facility] OR;  Service: General;  Laterality: N/A;   THYROID  SURGERY  2012   radioactive iodine swallowed   WISDOM TOOTH EXTRACTION  1980   XI ROBOTIC ASSISTED HIATAL HERNIA REPAIR N/A 10/10/2022   Procedure: XI ROBOTIC ASSISTED HIATAL HERNIA REPAIR WITH MESH AND FUNDOPLICATION;  Surgeon: Shela Derby, MD;  Location: [facility] OR;  Service: General;  Laterality: N/A;    OB History   No obstetric history on file.      Home Medications    Prior to Admission  medications   Medication Sig Start Date End Date Taking? Authorizing Provider  dicyclomine  (BENTYL ) 10 MG capsule Take 1 capsule (10 mg total) by mouth 2 (two) times daily. 02/19/24  Yes Lajuan Pila, MD  nitrofurantoin, macrocrystal-monohydrate, (MACROBID) 100 MG capsule Take 1 capsule (100 mg total) by mouth 2 (two) times daily. 04/20/24  Yes Britaney Espaillat A, PA-C  acetaminophen  (TYLENOL ) 500 MG tablet Take 500 mg by mouth every 6 (six) hours as needed for moderate pain or headache.    [provider]  ALPRAZolam  (XANAX ) 0.5 MG tablet TAKE 1/2 TO 1 TABLET BY MOUTH TWICE A DAY AS NEEDED 08/09/23   Crecencio Dodge, Candida Chalk, DO  apixaban  (ELIQUIS ) 5 MG TABS tablet Take 1 tablet (5 mg total) by mouth 2 (two) times daily. 01/02/24   Fenton, Clint R, PA  atorvastatin  (LIPITOR) 40 MG tablet Take 1 tablet (40 mg total) by mouth every evening. 04/02/24   Crecencio Dodge, Candida Chalk, DO  cephALEXin  (KEFLEX ) 500 MG capsule Take 1 capsule (500 mg total) by mouth 2 (two) times daily. Patient not taking: Reported on 02/27/2024 01/25/24   Crecencio Dodge, Adel Holt R, DO  diltiazem  (CARDIZEM  CD) 240 MG 24 hr capsule TAKE 1 CAPSULE BY MOUTH EVERY DAY 09/03/23   Mealor, Augustus E, MD  diltiazem  (CARDIZEM ) 30 MG tablet Take 30 mg by mouth daily as needed (afib).    [provider]  estradiol  (ESTRACE ) 0.1 MG/GM vaginal cream Place 1 Applicatorful vaginally at bedtime. 08/10/23   Lowne Chase, Yvonne R, DO  famotidine  (PEPCID ) 40 MG tablet TAKE 1 TABLET BY MOUTH EVERYDAY AT BEDTIME 04/30/23   Lajuan Pila, MD  flecainide  (TAMBOCOR ) 50 MG tablet Take 1 tablet (50 mg total) by mouth 2 (two) times daily. 01/07/24   Fenton, Clint R, PA  levothyroxine  (SYNTHROID ) 137 MCG tablet Take 125 mcg by mouth daily before breakfast.    [provider]    Family History Family History  Problem Relation Age of Onset   Hyperlipidemia Mother    Dementia Mother    Coronary artery disease Father    Atrial fibrillation Father     Skin cancer Father    Colon polyps Brother        says he goes every year to have a colonoscopy   Diabetes Maternal Grandmother    Coronary artery disease Paternal Grandfather    Coronary artery disease Paternal Uncle    Colon cancer Neg Hx    Esophageal cancer Neg Hx     Social History Social History   Tobacco Use   Smoking status: Never   Smokeless tobacco: Never   Tobacco comments:    Never smoke 12/25/22  Vaping Use   Vaping status: Never Used  Substance Use Topics   Alcohol use: Yes    Alcohol/week: 1.0 standard drink of alcohol    Types: 1 Glasses of wine per week  Comment: very occasionally, not every week   Drug use: No     Allergies   Peanut-containing drug products   Review of Systems Review of Systems  Constitutional:  Negative for chills and fever.  HENT:  Negative for ear pain and sore throat.   Eyes:  Negative for pain and visual disturbance.  Respiratory:  Negative for cough and shortness of breath.   Cardiovascular:  Negative for chest pain and palpitations.  Gastrointestinal:  Negative for abdominal pain and vomiting.  Genitourinary:  Positive for dysuria, frequency, pelvic pain and urgency. Negative for hematuria.  Musculoskeletal:  Negative for arthralgias and back pain.  Skin:  Negative for color change and rash.  Neurological:  Negative for seizures and syncope.  All other systems reviewed and are negative.    Physical Exam Triage Vital Signs ED Triage Vitals  Encounter Vitals Group     BP 04/20/24 1420 107/63     Girls Systolic BP Percentile --      Girls Diastolic BP Percentile --      Boys Systolic BP Percentile --      Boys Diastolic BP Percentile --      Pulse Rate 04/20/24 1420 (!) 59     Resp 04/20/24 1420 16     Temp 04/20/24 1420 97.9 F (36.6 C)     Temp src --      SpO2 04/20/24 1420 98 %     Weight --      Height --      Head Circumference --      Peak Flow --      Pain Score 04/20/24 1418 0     Pain Loc --       Pain Education --      Exclude from Growth Chart --    No data found.  Updated Vital Signs BP 107/63   Pulse (!) 59   Temp 97.9 F (36.6 C)   Resp 16   LMP 01/26/2012   SpO2 98%   Visual Acuity Right Eye Distance:   Left Eye Distance:   Bilateral Distance:    Right Eye Near:   Left Eye Near:    Bilateral Near:     Physical Exam Vitals and nursing note reviewed.  Constitutional:      General: She is not in acute distress.    Appearance: She is well-developed.  HENT:     Head: Normocephalic and atraumatic.   Eyes:     Conjunctiva/sclera: Conjunctivae normal.    Cardiovascular:     Rate and Rhythm: Normal rate and regular rhythm.     Heart sounds: No murmur heard. Pulmonary:     Effort: Pulmonary effort is normal. No respiratory distress.     Breath sounds: Normal breath sounds.  Abdominal:     Palpations: Abdomen is soft.     Tenderness: There is generalized abdominal tenderness (Mild generalized).   Musculoskeletal:        General: No swelling.     Cervical back: Neck supple.   Skin:    General: Skin is warm and dry.     Capillary Refill: Capillary refill takes less than 2 seconds.   Neurological:     Mental Status: She is alert.   Psychiatric:        Mood and Affect: Mood normal.      UC Treatments / Results  Labs (all labs ordered are listed, but only abnormal results are displayed) Labs Reviewed  POCT URINALYSIS DIP (MANUAL ENTRY) -  Abnormal; Notable for the following components:      Result Value   Color, UA orange (*)    Glucose, UA =250 (*)    Bilirubin, UA small (*)    Ketones, POC UA trace (5) (*)    Spec Grav, UA <=1.005 (*)    Blood, UA moderate (*)    Protein Ur, POC =100 (*)    Urobilinogen, UA 4.0 (*)    Nitrite, UA Positive (*)    Leukocytes, UA Large (3+) (*)    All other components within normal limits  POCT FASTING CBG [facility] MANUAL ENTRY - Abnormal; Notable for the following components:   POCT Glucose ([facility]) 109 (*)     All other components within normal limits    EKG   Radiology No results found.  Procedures Procedures (including critical [name] time)  Medications Ordered in UC Medications - No data to display  Initial Impression / Assessment and Plan / UC Course  I have reviewed the triage vital signs and the nursing notes.  Pertinent labs & imaging results that were available during my [name] of the patient were reviewed by me and considered in my medical decision making (see chart for details).     Lower urinary tract infectious disease  Glycosuria   Urinalysis done today is consistent with a urinary tract infection.  After reviewing your urine culture that was done in March, we will start you on Macrobid 100 mg twice daily for 5 days.  Drink plenty of water and stay hydrated.  Due to the glucose in your urine we did check a fingerstick blood glucose today.  This was 109 which is with in normal range for a non-fasting blood glucose.  Recommend following up with your primary [name] physician regarding this.  Final Clinical Impressions(s) / UC Diagnoses   Final diagnoses:  Lower urinary tract infectious disease  Glycosuria     Discharge Instructions      Urinalysis done today is consistent with a urinary tract infection.  After reviewing your urine culture that was done in March, we will start you on Macrobid 100 mg twice daily for 5 days.  Drink plenty of water and stay hydrated.  Due to the glucose in your urine we did check a fingerstick blood glucose today.  This was 109 which is with in normal range for a non-fasting blood glucose.  Recommend following up with your primary [name] physician regarding this.     ED Prescriptions     Medication Sig Dispense Auth. Provider   nitrofurantoin, macrocrystal-monohydrate, (MACROBID) 100 MG capsule Take 1 capsule (100 mg total) by mouth 2 (two) times daily. 10 capsule Kreg Pesa, [place]      PDMP not reviewed this encounter.    Kreg Pesa, PA-C 04/20/24 1500    Kreg Pesa, [place] 04/20/24 1501

## 2024-04-20 NOTE — ED Triage Notes (Signed)
 Pt presents to uc with co dysuria and bladder pressure and urgency. Pt reports she is taking azo otc.

## 2024-04-21 ENCOUNTER — Ambulatory Visit: Admitting: Family Medicine

## 2024-04-22 ENCOUNTER — Ambulatory Visit: Payer: Self-pay

## 2024-04-22 LAB — URINE CULTURE: Culture: 50000 — AB

## 2024-04-28 ENCOUNTER — Ambulatory Visit (HOSPITAL_COMMUNITY): Admitting: Physician Assistant

## 2024-05-06 ENCOUNTER — Ambulatory Visit (HOSPITAL_COMMUNITY)
Admission: RE | Admit: 2024-05-06 | Discharge: 2024-05-06 | Disposition: A | Discharge status: Home / Self Care | Source: Ambulatory Visit | Attending: Physician Assistant | Admitting: Physician Assistant

## 2024-05-06 VITALS — BP 124/70 | HR 54 | Ht 67.0 in | Wt 180.0 lb

## 2024-05-06 DIAGNOSIS — I480 Paroxysmal atrial fibrillation: Secondary | ICD-10-CM | POA: Diagnosis not present

## 2024-05-06 DIAGNOSIS — Z79899 Other long term (current) drug therapy: Secondary | ICD-10-CM

## 2024-05-06 DIAGNOSIS — Z5181 Encounter for therapeutic drug level monitoring: Secondary | ICD-10-CM

## 2024-05-06 DIAGNOSIS — I4891 Unspecified atrial fibrillation: Secondary | ICD-10-CM | POA: Diagnosis not present

## 2024-05-06 DIAGNOSIS — D6869 Other thrombophilia: Secondary | ICD-10-CM | POA: Diagnosis not present

## 2024-05-06 NOTE — Progress Notes (Signed)
 Primary Care Physician: Antonio Meth, Jamee SAUNDERS, DO Primary Cardiologist: none Primary Electrophysiologist: Dr Nancey  Referring Physician: Dr Kelsie Slater [name] Alexandria is a 63 y.o. female with a history of Graves disease, hiatal hernia, HLD, HTN, CVA, atrial fibrillation who presents for follow up in the [facility]. The patient was initially diagnosed with atrial fibrillation 2018/2019 and underwent afib ablation 08/2018 with Dr Kelsie. She has been maintained on flecainide . Patient is on Eliquis  for stroke prevention. Flecainide  discontinued 12/2023 as she was maintaining SR.  Patient returns for follow up for atrial fibrillation. She reports that about one week after discontinuing flecainide  she had more episodes of tachypalpitations and resumed the medication. She has not had any since. No bleeding issues on anticoagulation.   Today, she  denies symptoms of palpitations, chest pain, shortness of breath, orthopnea, PND, lower extremity edema, dizziness, presyncope, syncope, snoring, daytime somnolence, bleeding, or neurologic sequela. The patient is tolerating medications without difficulties and is otherwise without complaint today.    Atrial Fibrillation Risk Factors:  she does not have symptoms or diagnosis of sleep apnea. she does not have a history of rheumatic fever.   Atrial Fibrillation Management history:  Previous antiarrhythmic drugs: flecainide   Previous cardioversions: none Previous ablations: 2019 Anticoagulation history: Eliquis    Past Medical History:  Diagnosis Date   Anemia    hx of iron  deficiency anemia (pt had iron  infusions)   Anxiety    Atrial fibrillation (HCC)    Depression    GERD (gastroesophageal reflux disease)    Graves disease    Hiatal hernia    a. seen on CT 12/2016.   Hyperlipidemia    Hypertension    Hypothyroidism    pt had radioiodine treatment on thyroid    Low iron     pt received iron  infusions 08/2022    NSVT (nonsustained ventricular tachycardia) (HCC)    Pulmonary nodules    a. seen on CT 12/2016.   Stroke [facility])    TIA (transient ischemic attack) 01/2017   a. tx at [facility].    Current Outpatient Medications  Medication Sig Dispense Refill   acetaminophen  (TYLENOL ) 500 MG tablet Take 500 mg by mouth every 6 (six) hours as needed for moderate pain or headache. (Patient taking differently: Take 500 mg by mouth as needed for moderate pain (pain score 4-6) or headache.)     ALPRAZolam  (XANAX ) 0.5 MG tablet TAKE 1/2 TO 1 TABLET BY MOUTH TWICE A DAY AS NEEDED (Patient taking differently: TAKE 1/2 TO 1 TABLET BY MOUTH AS NEEDED) 30 tablet 1   apixaban  (ELIQUIS ) 5 MG TABS tablet Take 1 tablet (5 mg total) by mouth 2 (two) times daily. 180 tablet 3   atorvastatin  (LIPITOR) 40 MG tablet Take 1 tablet (40 mg total) by mouth every evening. 90 tablet 0   dicyclomine  (BENTYL ) 10 MG capsule Take 1 capsule (10 mg total) by mouth 2 (two) times daily. (Patient taking differently: Take 10 mg by mouth as needed.) 60 capsule 4   diltiazem  (CARDIZEM  CD) 240 MG 24 hr capsule TAKE 1 CAPSULE BY MOUTH EVERY DAY 90 capsule 2   diltiazem  (CARDIZEM ) 30 MG tablet Take 30 mg by mouth daily as needed (afib). (Patient taking differently: Take 30 mg by mouth as needed (afib).)     estradiol  (ESTRACE ) 0.1 MG/GM vaginal cream Place 1 Applicatorful vaginally at bedtime. 42.5 g 12   famotidine  (PEPCID ) 40 MG tablet TAKE 1 TABLET BY MOUTH EVERYDAY AT  BEDTIME (Patient taking differently: Take 40 mg by mouth as needed.) 90 tablet 1   flecainide  (TAMBOCOR ) 50 MG tablet Take 1 tablet (50 mg total) by mouth 2 (two) times daily. 60 tablet 3   levothyroxine  (SYNTHROID ) 125 MCG tablet Take 125 mcg by mouth every morning.     No current facility-administered medications for this encounter.    ROS- All systems are reviewed and negative except as per the HPI above.  Physical Exam: Vitals:   05/06/24 1107  BP: 124/70  Pulse: (!)  54  Weight: 81.6 kg  Height: 5' 7 (1.702 m)     GEN: Well nourished, well developed in no acute distress CARDIAC: Regular rate and rhythm, no murmurs, rubs, gallops RESPIRATORY:  Clear to auscultation without rales, wheezing or rhonchi  ABDOMEN: Soft, non-tender, non-distended EXTREMITIES:  No edema; No deformity    Wt Readings from Last 3 Encounters:  05/06/24 81.6 kg  02/27/24 81.6 kg  01/25/24 83.5 kg    EKG today demonstrates  SB, 1st degree AV block Vent. rate 54 BPM PR interval 220 ms QRS duration 86 ms QT/QTcB 442/419 ms   Echo 04/05/18 demonstrated  - Left ventricle: The cavity size was normal. Systolic function was    normal. The estimated ejection fraction was in the range of 60%    to 65%. Wall motion was normal; there were no regional wall    motion abnormalities. Left ventricular diastolic function    parameters were normal.  - Mitral valve: There was mild regurgitation.  - Left atrium: The atrium was mildly dilated.  - Atrial septum: No defect or patent foramen ovale was identified.  - Impressions: Appears to be some shadowing artifact in LA on    subcostal images.   Epic records are reviewed at length today  CHA2DS2-VASc Score = 4  The patient's score is based upon: CHF History: 0 HTN History: 1 Diabetes History: 0 Stroke History: 2 Vascular Disease History: 0 Age Score: 0 Gender Score: 1       ASSESSMENT AND PLAN: Paroxysmal Atrial Fibrillation (ICD10:  I48.0) The patient's CHA2DS2-VASc score is 4, indicating a 4.8% annual risk of stroke.   S/p afib ablation 2019 with Dr Kelsie Flecainide  resumed. Continue flecainide  50 mg BID Patient appears to be maintaining SR. She would consider repeat ablation in the future to get off AAD but would like to continue her present therapy for now.  Continue diltiazem  240 mg daily with 30 mg PRN q 4 hours for heart racing Continue Eliquis  5 mg BID  Secondary Hypercoagulable State (ICD10:  D68.69) The  patient is at significant risk for stroke/thromboembolism based upon her CHA2DS2-VASc Score of 4.  Continue Apixaban  (Eliquis ). No bleeding issues on anticoagulation.   High Risk Medication Monitoring (ICD 10: Z79.899) Intervals on ECG acceptable for flecainide  monitoring. PR prolonged but stable.   HTN Stable on current regimen   Follow up in the AF clinic in 6 months.    Daril Kicks PA-C [facility] [address] [place], [place] [address] [phone] 05/06/2024 11:23 AM

## 2024-05-14 ENCOUNTER — Telehealth: Payer: Self-pay | Admitting: Family Medicine

## 2024-05-14 NOTE — Telephone Encounter (Signed)
 Pt dropped off form to be completed by pcp. Pt asks for it to be picked up tomorrow. Made pt aware pcp is not in office today. Pt asks to be called when form complete.

## 2024-05-14 NOTE — Telephone Encounter (Signed)
 Received

## 2024-05-17 ENCOUNTER — Other Ambulatory Visit: Payer: Self-pay | Admitting: Gastroenterology

## 2024-05-17 DIAGNOSIS — R109 Unspecified abdominal pain: Secondary | ICD-10-CM

## 2024-05-19 NOTE — Telephone Encounter (Signed)
Form placed at the front. Pt made aware

## 2024-05-29 ENCOUNTER — Other Ambulatory Visit: Payer: Self-pay

## 2024-05-29 MED ORDER — DILTIAZEM HCL ER COATED BEADS 240 MG PO CP24: ORAL_CAPSULE | ORAL | 2 refills | Status: AC

## 2024-05-29 NOTE — Telephone Encounter (Signed)
This is a A-Fib clinic pt 

## 2024-06-10 ENCOUNTER — Other Ambulatory Visit: Payer: Self-pay | Admitting: Gastroenterology

## 2024-06-10 DIAGNOSIS — R109 Unspecified abdominal pain: Secondary | ICD-10-CM

## 2024-06-16 ENCOUNTER — Inpatient Hospital Stay (HOSPITAL_BASED_OUTPATIENT_CLINIC_OR_DEPARTMENT_OTHER): Payer: BC Managed Care – PPO | Admitting: Family

## 2024-06-16 ENCOUNTER — Inpatient Hospital Stay: Payer: Self-pay | Attending: Family

## 2024-06-16 VITALS — BP 116/66 | HR 67 | Temp 98.9°F | Resp 19 | Ht 67.0 in | Wt 183.0 lb

## 2024-06-16 DIAGNOSIS — Z79899 Other long term (current) drug therapy: Secondary | ICD-10-CM | POA: Diagnosis not present

## 2024-06-16 DIAGNOSIS — D509 Iron deficiency anemia, unspecified: Secondary | ICD-10-CM

## 2024-06-16 LAB — CBC WITH DIFFERENTIAL (CANCER CENTER ONLY)
Abs Immature Granulocytes: 0.05 K/uL (ref 0.00–0.07)
Basophils Absolute: 0.1 K/uL (ref 0.0–0.1)
Basophils Relative: 1 %
Eosinophils Absolute: 0.1 K/uL (ref 0.0–0.5)
Eosinophils Relative: 2 %
HCT: 39.2 % (ref 36.0–46.0)
Hemoglobin: 13.1 g/dL (ref 12.0–15.0)
Immature Granulocytes: 1 %
Lymphocytes Relative: 20 %
Lymphs Abs: 1.4 K/uL (ref 0.7–4.0)
MCH: 30.5 pg (ref 26.0–34.0)
MCHC: 33.4 g/dL (ref 30.0–36.0)
MCV: 91.4 fL (ref 80.0–100.0)
Monocytes Absolute: 0.6 K/uL (ref 0.1–1.0)
Monocytes Relative: 9 %
Neutro Abs: 4.8 K/uL (ref 1.7–7.7)
Neutrophils Relative %: 67 %
Platelet Count: 278 K/uL (ref 150–400)
RBC: 4.29 MIL/uL (ref 3.87–5.11)
RDW: 12.9 % (ref 11.5–15.5)
WBC Count: 7.1 K/uL (ref 4.0–10.5)
nRBC: 0 % (ref 0.0–0.2)

## 2024-06-16 LAB — IRON AND IRON BINDING CAPACITY (CC-WL,HP ONLY)
Iron: 79 ug/dL (ref 28–170)
Saturation Ratios: 29 % (ref 10.4–31.8)
TIBC: 270 ug/dL (ref 250–450)
UIBC: 191 ug/dL

## 2024-06-16 LAB — FERRITIN: Ferritin: 115 ng/mL (ref 11–307)

## 2024-06-16 LAB — RETICULOCYTES
Immature Retic Fract: 10.5 % (ref 2.3–15.9)
RBC.: 4.29 MIL/uL (ref 3.87–5.11)
Retic Count, Absolute: 59.2 K/uL (ref 19.0–186.0)
Retic Ct Pct: 1.4 % (ref 0.4–3.1)

## 2024-06-16 NOTE — Progress Notes (Signed)
 Hematology and Oncology Follow Up Visit  Alexandria [name] [id number] 1961/02/11 63 y.o. 06/16/2024   Principle Diagnosis:  Iron  deficiency anemia   Current Therapy:        IV iron  as indicated    Interim History:  Alexandria [name] is here today for follow-up. She is doing well and has no complaints at this time.  No issues with blood loss. No bruising or petechiae.  No fever, chills, n/v, cough, rash, dizziness, SOB, chest pain, palpitations, abdominal pan or changes in bowel or bladder habits.  No swelling, tenderness, numbness or tingling in her extremities.  No falls or syncope reported.  Appetite and hydration are good. Weight is stable at 183 lbs.   ECOG Performance Status: 1 - Symptomatic but completely ambulatory  Medications:  Allergies as of 06/16/2024       Reactions   Peanut-containing Drug Products Swelling        Medication List        Accurate as of June 16, 2024  2:55 PM. If you have any questions, ask your nurse or doctor.          acetaminophen  500 MG tablet Commonly known as: TYLENOL  Take 500 mg by mouth every 6 (six) hours as needed for moderate pain or headache. What changed: when to take this   ALPRAZolam  0.5 MG tablet Commonly known as: XANAX  TAKE 1/2 TO 1 TABLET BY MOUTH TWICE A DAY AS NEEDED What changed: additional instructions   apixaban  5 MG Tabs tablet Commonly known as: Eliquis  Take 1 tablet (5 mg total) by mouth 2 (two) times daily.   atorvastatin  40 MG tablet Commonly known as: LIPITOR Take 1 tablet (40 mg total) by mouth every evening.   dicyclomine  10 MG capsule Commonly known as: BENTYL  TAKE 1 CAPSULE (10 MG TOTAL) BY MOUTH AS NEEDED.   diltiazem  240 MG 24 hr capsule Commonly known as: CARDIZEM  CD TAKE 1 CAPSULE BY MOUTH EVERY DAY   diltiazem  30 MG tablet Commonly known as: CARDIZEM  Take 30 mg by mouth daily as needed (afib). What changed: when to take this   estradiol  0.1 MG/GM vaginal cream Commonly known as:  ESTRACE  Place 1 Applicatorful vaginally at bedtime.   famotidine  40 MG tablet Commonly known as: PEPCID  TAKE 1 TABLET BY MOUTH EVERYDAY AT BEDTIME What changed: See the new instructions.   flecainide  50 MG tablet Commonly known as: TAMBOCOR  Take 1 tablet (50 mg total) by mouth 2 (two) times daily.   levothyroxine  125 MCG tablet Commonly known as: SYNTHROID  Take 125 mcg by mouth every morning.        Allergies:  Allergies  Allergen Reactions   Peanut-Containing Drug Products Swelling    Past Medical History, Surgical history, Social history, and Family History were reviewed and updated.  Review of Systems: All other 10 point review of systems is negative.   Physical Exam:  vitals were not taken for this visit.   Wt Readings from Last 3 Encounters:  05/06/24 180 lb (81.6 kg)  02/27/24 180 lb (81.6 kg)  01/25/24 184 lb (83.5 kg)    Ocular: Sclerae unicteric, pupils equal, round and reactive to light Ear-nose-throat: Oropharynx clear, dentition fair Lymphatic: No cervical or supraclavicular adenopathy Lungs no rales or rhonchi, good excursion bilaterally Heart regular rate and rhythm, no murmur appreciated Abd soft, nontender, positive bowel sounds MSK no focal spinal tenderness, no joint edema Neuro: non-focal, well-oriented, appropriate affect Breasts: Deferred   Lab Results  Component Value Date   WBC  7.1 06/16/2024   HGB 13.1 06/16/2024   HCT 39.2 06/16/2024   MCV 91.4 06/16/2024   PLT 278 06/16/2024   Lab Results  Component Value Date   FERRITIN 104 06/25/2023   IRON  75 06/25/2023   TIBC 253 06/25/2023   UIBC 178 06/25/2023   IRONPCTSAT 30 06/25/2023   Lab Results  Component Value Date   RETICCTPCT 1.4 06/16/2024   RBC 4.29 06/16/2024   RBC 4.29 06/16/2024   No results found for: KPAFRELGTCHN, LAMBDASER, KAPLAMBRATIO No results found for: IGGSERUM, IGA, IGMSERUM No results found for: STEPHANY CARLOTA BENSON MARKEL EARLA JOANNIE DOC VICK, SPEI   Chemistry      Component Value Date/Time   NA 142 10/22/2023 1119   NA 141 01/14/2020 1608   K 4.1 10/22/2023 1119   CL 107 10/22/2023 1119   CO2 28 10/22/2023 1119   BUN 18 10/22/2023 1119   BUN 17 01/14/2020 1608   CREATININE 0.71 10/22/2023 1119   CREATININE 0.86 06/09/2022 1137   CREATININE 0.77 09/16/2021 1456      Component Value Date/Time   CALCIUM  8.6 10/22/2023 1119   ALKPHOS 107 10/22/2023 1119   AST 14 10/22/2023 1119   AST 13 (L) 06/09/2022 1137   ALT 16 10/22/2023 1119   ALT 17 06/09/2022 1137   BILITOT 0.5 10/22/2023 1119   BILITOT 0.4 06/09/2022 1137       Impression and Plan: Alexandria [name] is a very pleasant 63 yo caucasian female with history of iron  deficiency anemia since childhood. This appears to have resolved with hernia repair.  Iron  studies pending. She has not required IV iron  in over 2 years.  At this time we will release her back to her PCP for follow-up.   Lauraine Pepper, NP 8/11/20252:55 PM

## 2024-06-17 DIAGNOSIS — E890 Postprocedural hypothyroidism: Secondary | ICD-10-CM | POA: Diagnosis not present

## 2024-06-28 ENCOUNTER — Other Ambulatory Visit (HOSPITAL_COMMUNITY): Payer: Self-pay | Admitting: Physician Assistant

## 2024-06-29 ENCOUNTER — Other Ambulatory Visit: Payer: Self-pay | Admitting: Family Medicine

## 2024-06-29 DIAGNOSIS — E785 Hyperlipidemia, unspecified: Secondary | ICD-10-CM

## 2024-06-30 ENCOUNTER — Encounter: Payer: Self-pay | Admitting: Family Medicine

## 2024-07-01 ENCOUNTER — Telehealth (INDEPENDENT_AMBULATORY_CARE_PROVIDER_SITE_OTHER): Admitting: Physician Assistant

## 2024-07-01 ENCOUNTER — Encounter: Payer: Self-pay | Admitting: Physician Assistant

## 2024-07-01 DIAGNOSIS — U071 COVID-19: Secondary | ICD-10-CM

## 2024-07-01 MED ORDER — MOLNUPIRAVIR EUA 200MG CAPSULE: 4.0000 | ORAL_CAPSULE | Freq: Two times a day (BID) | ORAL | 0 refills | Status: AC

## 2024-07-01 NOTE — Progress Notes (Signed)
 MyChart Video Visit    Virtual Visit via Video Note   This format is felt to be most appropriate for this patient at this time. Physical exam was limited by quality of the video and audio technology used for the visit.   Patient location: home Provider location: [email or web address]  I discussed the limitations of evaluation and management by telemedicine and the availability of in person appointments. The patient expressed understanding and agreed to proceed.  Patient: Alexandria [name]   DOB: 16-Apr-1961   63 y.o. Female  MRN: [id number] Visit Date: 07/01/2024  Today's healthcare provider: Manuelita Flatness, PA-C   Chief Complaint  Patient presents with   Covid Positive    Tested yesterday   Subjective    HPI  Pt report symptoms since Sunday ,but tested positive yesterday, headache, congestion, sore throat, fatigue, cough, body aches, fever of 100.76F. Taking corcidin and delsym otc.   She tested positive for COVID yesterday.  Medications: Outpatient Medications Prior to Visit  Medication Sig   acetaminophen  (TYLENOL ) 500 MG tablet Take 500 mg by mouth every 6 (six) hours as needed for moderate pain or headache. (Patient taking differently: Take 500 mg by mouth as needed for moderate pain (pain score 4-6) or headache.)   ALPRAZolam  (XANAX ) 0.5 MG tablet TAKE 1/2 TO 1 TABLET BY MOUTH TWICE A DAY AS NEEDED (Patient taking differently: TAKE 1/2 TO 1 TABLET BY MOUTH AS NEEDED)   apixaban  (ELIQUIS ) 5 MG TABS tablet Take 1 tablet (5 mg total) by mouth 2 (two) times daily.   atorvastatin  (LIPITOR) 40 MG tablet TAKE 1 TABLET BY MOUTH EVERY DAY IN THE EVENING   dicyclomine  (BENTYL ) 10 MG capsule TAKE 1 CAPSULE (10 MG TOTAL) BY MOUTH AS NEEDED.   diltiazem  (CARDIZEM  CD) 240 MG 24 hr capsule TAKE 1 CAPSULE BY MOUTH EVERY DAY   diltiazem  (CARDIZEM ) 30 MG tablet Take 30 mg by mouth daily as needed (afib). (Patient taking differently: Take 30 mg by mouth as needed (afib).)   estradiol  (ESTRACE ) 0.1 MG/GM  vaginal cream Place 1 Applicatorful vaginally at bedtime.   famotidine  (PEPCID ) 40 MG tablet TAKE 1 TABLET BY MOUTH EVERYDAY AT BEDTIME (Patient taking differently: Take 40 mg by mouth as needed.)   flecainide  (TAMBOCOR ) 50 MG tablet TAKE 1 TABLET BY MOUTH TWICE A DAY   levothyroxine  (SYNTHROID ) 125 MCG tablet Take 125 mcg by mouth every morning.   No facility-administered medications prior to visit.    Review of Systems  Constitutional:  Positive for chills, fatigue and fever.  HENT:  Positive for congestion, sneezing and sore throat.   Respiratory:  Positive for cough. Negative for shortness of breath.   Cardiovascular:  Negative for chest pain and leg swelling.  Gastrointestinal:  Negative for abdominal pain.  Neurological:  Positive for headaches. Negative for dizziness.        Objective    LMP 01/26/2012       Physical Exam Constitutional:      Appearance: Normal appearance. She is not ill-appearing.  Neurological:     Mental Status: She is oriented to person, place, and time.  Psychiatric:        Mood and Affect: Mood normal.        Behavior: Behavior normal.        Assessment & Plan     1. COVID-19 (Primary) She meets guidelines for antiviral meds d/t age, comorbidities Reviewed SE of molnupiravir   Paxlovid is CI d/t med interactions  Hydrate, rest, cont  otc meds.   - molnupiravir  EUA (LAGEVRIO ) 200 mg CAPS capsule; Take 4 capsules (800 mg total) by mouth 2 (two) times daily for 5 days.  Dispense: 40 capsule; Refill: 0    Return if symptoms worsen or fail to improve.    I discussed the assessment and treatment plan with the patient. The patient was provided an opportunity to ask questions and all were answered. The patient agreed with the plan and demonstrated an understanding of the instructions.   The patient was advised to call back or seek an in-person evaluation if the symptoms worsen or if the condition fails to improve as anticipated.  Manuelita Flatness, PA-C [facility] [phone] (phone) [phone] (fax)  [facility]

## 2024-09-16 DIAGNOSIS — Z1231 Encounter for screening mammogram for malignant neoplasm of breast: Secondary | ICD-10-CM | POA: Diagnosis not present

## 2024-09-16 DIAGNOSIS — Z01419 Encounter for gynecological examination (general) (routine) without abnormal findings: Secondary | ICD-10-CM | POA: Diagnosis not present

## 2024-09-16 DIAGNOSIS — N952 Postmenopausal atrophic vaginitis: Secondary | ICD-10-CM | POA: Diagnosis not present

## 2024-09-16 DIAGNOSIS — Z6830 Body mass index (BMI) 30.0-30.9, adult: Secondary | ICD-10-CM | POA: Diagnosis not present

## 2024-09-16 DIAGNOSIS — N941 Unspecified dyspareunia: Secondary | ICD-10-CM | POA: Diagnosis not present

## 2024-09-25 ENCOUNTER — Other Ambulatory Visit: Payer: Self-pay | Admitting: Family Medicine

## 2024-09-25 DIAGNOSIS — E785 Hyperlipidemia, unspecified: Secondary | ICD-10-CM

## 2024-10-21 ENCOUNTER — Encounter: Payer: Self-pay | Admitting: Pharmacist

## 2024-10-21 ENCOUNTER — Telehealth: Payer: Self-pay | Admitting: Pharmacist

## 2024-10-21 NOTE — Progress Notes (Signed)
 Pharmacy Quality Measure Review  This patient is appearing on a report for being at risk of failing the adherence measure for cholesterol (statin) medications this calendar year.   Medication: atorvastatin   Last fill date: 06/30/2024 for 90 day supply per adherence report.   Reviewed recent refill history in Dr Annemarie database. Actual last refill date was 09/25/2024 for 30 day supply. Patient has no refills remaining. Next appointment with PCP is not currently scheduled. She was last seen 02/04/2024 for acute visit and last preventative health visit was 10/22/2023.   Patient is passing statin adherence for now but is due to refill again around 10/25/24.   Left voicemail for patient to contact office to make follow up visit with PCP - office number [phone].   Madelin Ray, PharmD Clinical Pharmacist [facility]  Population Health [phone]

## 2024-10-21 NOTE — Telephone Encounter (Signed)
 Patient will be due to refill atorvastatin  later this week but per last Rx she needs follow up appointment for refills.  Tried to reach patient today to schedule follow up with PCP. No answer but LM on VM to call office to schedule follow up [phone]  Last appt 01/25/2024 - acute visit for UTI Last preventative health visit 10/22/2023

## 2024-10-27 ENCOUNTER — Other Ambulatory Visit: Payer: Self-pay | Admitting: Family Medicine

## 2024-10-27 DIAGNOSIS — E785 Hyperlipidemia, unspecified: Secondary | ICD-10-CM

## 2024-11-03 ENCOUNTER — Ambulatory Visit (INDEPENDENT_AMBULATORY_CARE_PROVIDER_SITE_OTHER): Admitting: Family Medicine

## 2024-11-03 ENCOUNTER — Encounter: Payer: Self-pay | Admitting: Family Medicine

## 2024-11-03 VITALS — BP 120/86 | HR 57 | Temp 98.2°F | Resp 18 | Ht 67.0 in | Wt 185.2 lb

## 2024-11-03 DIAGNOSIS — E039 Hypothyroidism, unspecified: Secondary | ICD-10-CM

## 2024-11-03 DIAGNOSIS — R002 Palpitations: Secondary | ICD-10-CM | POA: Diagnosis not present

## 2024-11-03 DIAGNOSIS — E785 Hyperlipidemia, unspecified: Secondary | ICD-10-CM | POA: Diagnosis not present

## 2024-11-03 DIAGNOSIS — I10 Essential (primary) hypertension: Secondary | ICD-10-CM | POA: Diagnosis not present

## 2024-11-03 DIAGNOSIS — R739 Hyperglycemia, unspecified: Secondary | ICD-10-CM | POA: Diagnosis not present

## 2024-11-03 DIAGNOSIS — I4891 Unspecified atrial fibrillation: Secondary | ICD-10-CM

## 2024-11-03 DIAGNOSIS — Z8673 Personal history of transient ischemic attack (TIA), and cerebral infarction without residual deficits: Secondary | ICD-10-CM | POA: Diagnosis not present

## 2024-11-03 MED ORDER — ATORVASTATIN CALCIUM 40 MG PO TABS: 40.0000 mg | ORAL_TABLET | Freq: Every day | ORAL | 1 refills | Status: AC

## 2024-11-03 MED ORDER — ALPRAZOLAM 0.5 MG PO TABS: ORAL_TABLET | ORAL | 1 refills | Status: AC

## 2024-11-03 MED ORDER — ATORVASTATIN CALCIUM 40 MG PO TABS: 40.0000 mg | ORAL_TABLET | Freq: Every day | ORAL | 0 refills | Status: DC

## 2024-11-03 NOTE — Progress Notes (Signed)
 "  Subjective:    Patient ID: Alexandria [name], female    DOB: Sep 06, 1961, 63 y.o.   MRN: [id number]  Chief Complaint  Patient presents with   Hyperlipidemia   Follow-up    HPI Patient is in today for f/u chol and labs.  Discussed the use of AI scribe software for clinical note transcription with the patient, who gave verbal consent to proceed.  History of Present Illness Alexandria [name] is a 63 year old female who presents for a follow-up visit and medication refill.  She recently encountered an issue with her cholesterol medication refill, which has been resolved as she received the medication today.  She received a notification from the pharmacy about a medication being called in, which she believes to be alprazolam . She uses alprazolam  infrequently, specifically when she experiences anxiety during visits to the ATF.  She is experiencing significant stress due to personal issues, including an impending separation from her husband. She discovered her husband's ongoing communication with his ex-wife, which has been happening for about a year. She is in the process of finding an attorney to handle the separation and is concerned about protecting her inherited house. She mentions that her husband paid property taxes last year, and she has separate bank accounts. She has been married for four years, and she is determined to keep her house, which she inherited after her parents passed away.    Past Medical History:  Diagnosis Date   Anemia    hx of iron  deficiency anemia (pt had iron  infusions)   Anxiety    Atrial fibrillation (HCC)    Depression    GERD (gastroesophageal reflux disease)    Graves disease    Hiatal hernia    a. seen on CT 12/2016.   Hyperlipidemia    Hypertension    Hypothyroidism    pt had radioiodine treatment on thyroid    Low iron     pt received iron  infusions 08/2022   NSVT (nonsustained ventricular tachycardia) (HCC)    Pulmonary nodules    a. seen on CT  12/2016.   Stroke [facility])    TIA (transient ischemic attack) 01/2017   a. tx at [facility].    Past Surgical History:  Procedure Laterality Date   ATRIAL FIBRILLATION ABLATION N/A 09/12/2018   Procedure: ATRIAL FIBRILLATION ABLATION;  Surgeon: Kelsie Agent, MD;  Location: [facility] INVASIVE CV LAB;  Service: Cardiovascular;  Laterality: N/A;   BREAST LUMPECTOMY WITH RADIOACTIVE SEED LOCALIZATION Right 06/28/2022   Procedure: RIGHT BREAST LUMPECTOMY WITH RADIOACTIVE SEED LOCALIZATION;  Surgeon: Vanderbilt Ned, MD;  Location: [facility] OR;  Service: General;  Laterality: Right;   CARPAL TUNNEL RELEASE Right 1991   DILATION AND CURETTAGE OF UTERUS  2018   INSERTION OF MESH N/A 10/10/2022   Procedure: INSERTION OF MESH;  Surgeon: Rubin Calamity, MD;  Location: [facility] OR;  Service: General;  Laterality: N/A;   THYROID  SURGERY  2012   radioactive iodine swallowed   WISDOM TOOTH EXTRACTION  1980   XI ROBOTIC ASSISTED HIATAL HERNIA REPAIR N/A 10/10/2022   Procedure: XI ROBOTIC ASSISTED HIATAL HERNIA REPAIR WITH MESH AND FUNDOPLICATION;  Surgeon: Rubin Calamity, MD;  Location: [facility] OR;  Service: General;  Laterality: N/A;    Family History  Problem Relation Age of Onset   Hyperlipidemia Mother    Dementia Mother    Coronary artery disease Father    Atrial fibrillation Father    Skin cancer Father    Colon polyps Brother  says he goes every year to have a colonoscopy   Diabetes Maternal Grandmother    Coronary artery disease Paternal Grandfather    Coronary artery disease Paternal Uncle    Colon cancer Neg Hx    Esophageal cancer Neg Hx     Social History   Socioeconomic History   Marital status: Married    Spouse name: Not on file   Number of children: 4   Years of education: Not on file   Highest education level: Not on file  Occupational History   Occupation: Editor, commissioning    Comment: retired  Tobacco Use   Smoking status: Never   Smokeless tobacco: Never   Tobacco comments:     Never smoke 12/25/22  Vaping Use   Vaping status: Never Used  Substance and Sexual Activity   Alcohol use: Yes    Alcohol/week: 1.0 standard drink of alcohol    Types: 1 Glasses of wine per week    Comment: very occasionally, not every week   Drug use: No   Sexual activity: Yes  Other Topics Concern   Not on file  Social History Narrative   Lives in [place] School math teacher--- retired   Social Drivers of Health   Tobacco Use: Low Risk (11/03/2024)   Patient History    Smoking Tobacco Use: Never    Smokeless Tobacco Use: Never    Passive Exposure: Not on file  Financial Resource Strain: Not on file  Food Insecurity: No Food Insecurity (12/10/2022)   Hunger Vital Sign    Worried About Running Out of Food in the Last Year: Never true    Ran Out of Food in the Last Year: Never true  Transportation Needs: No Transportation Needs (12/10/2022)   PRAPARE - Administrator, Civil Service (Medical): No    Lack of Transportation (Non-Medical): No  Physical Activity: Not on file  Stress: Not on file  Social Connections: Not on file  Intimate Partner Violence: Not At Risk (12/10/2022)   Humiliation, Afraid, Rape, and Kick questionnaire    Fear of Current or Ex-Partner: No    Emotionally Abused: No    Physically Abused: No    Sexually Abused: No  Depression (PHQ2-9): Low Risk (06/16/2024)   Depression (PHQ2-9)    PHQ-2 Score: 0  Alcohol Screen: Not on file  Housing: Low Risk (12/10/2022)   Housing    Last Housing Risk Score: 0  Utilities: Not At Risk (12/10/2022)   AHC Utilities    Threatened with loss of utilities: No  Health Literacy: Not on file    Outpatient Medications Prior to Visit  Medication Sig Dispense Refill   acetaminophen  (TYLENOL ) 500 MG tablet Take 500 mg by mouth every 6 (six) hours as needed for moderate pain or headache. (Patient taking differently: Take 500 mg by mouth as needed for moderate pain (pain score 4-6) or headache.)     apixaban   (ELIQUIS ) 5 MG TABS tablet Take 1 tablet (5 mg total) by mouth 2 (two) times daily. 180 tablet 3   dicyclomine  (BENTYL ) 10 MG capsule TAKE 1 CAPSULE (10 MG TOTAL) BY MOUTH AS NEEDED. 90 capsule 0   diltiazem  (CARDIZEM  CD) 240 MG 24 hr capsule TAKE 1 CAPSULE BY MOUTH EVERY DAY 90 capsule 2   diltiazem  (CARDIZEM ) 30 MG tablet Take 30 mg by mouth daily as needed (afib). (Patient taking differently: Take 30 mg by mouth as needed (afib).)     famotidine  (PEPCID ) 40 MG  tablet TAKE 1 TABLET BY MOUTH EVERYDAY AT BEDTIME (Patient taking differently: Take 40 mg by mouth as needed.) 90 tablet 1   flecainide  (TAMBOCOR ) 50 MG tablet TAKE 1 TABLET BY MOUTH TWICE A DAY 180 tablet 1   levothyroxine  (SYNTHROID ) 125 MCG tablet Take 125 mcg by mouth every morning.     ALPRAZolam  (XANAX ) 0.5 MG tablet TAKE 1/2 TO 1 TABLET BY MOUTH TWICE A DAY AS NEEDED (Patient taking differently: TAKE 1/2 TO 1 TABLET BY MOUTH AS NEEDED) 30 tablet 1   atorvastatin  (LIPITOR) 40 MG tablet Take 1 tablet (40 mg total) by mouth daily. Pt needs office visit for further refills 90 tablet 0   estradiol  (ESTRACE ) 0.1 MG/GM vaginal cream Place 1 Applicatorful vaginally at bedtime. 42.5 g 12   No facility-administered medications prior to visit.    Allergies[1]  Review of Systems  Constitutional:  Negative for fever and malaise/fatigue.  HENT:  Negative for congestion.   Eyes:  Negative for blurred vision.  Respiratory:  Negative for cough and shortness of breath.   Cardiovascular:  Negative for chest pain, palpitations and leg swelling.  Gastrointestinal:  Negative for abdominal pain, blood in stool, nausea and vomiting.  Genitourinary:  Negative for dysuria and frequency.  Musculoskeletal:  Negative for back pain and falls.  Skin:  Negative for rash.  Neurological:  Negative for dizziness, loss of consciousness and headaches.  Endo/Heme/Allergies:  Negative for environmental allergies.  Psychiatric/Behavioral:  Negative for  depression. The patient is nervous/anxious.        Objective:    Physical Exam Vitals and nursing note reviewed.  Constitutional:      General: She is not in acute distress.    Appearance: Normal appearance. She is well-developed.  HENT:     Head: Normocephalic and atraumatic.  Eyes:     General: No scleral icterus.       Right eye: No discharge.        Left eye: No discharge.  Cardiovascular:     Rate and Rhythm: Normal rate and regular rhythm.     Heart sounds: No murmur heard. Pulmonary:     Effort: Pulmonary effort is normal. No respiratory distress.     Breath sounds: Normal breath sounds.  Musculoskeletal:        General: Normal range of motion.     Cervical back: Normal range of motion and neck supple.     Right lower leg: No edema.     Left lower leg: No edema.  Skin:    General: Skin is warm and dry.  Neurological:     Mental Status: She is alert and oriented to person, place, and time.  Psychiatric:        Mood and Affect: Mood normal.        Behavior: Behavior normal.        Thought Content: Thought content normal.        Judgment: Judgment normal.     BP 120/86 (BP Location: Left Arm, Patient Position: Sitting, Cuff Size: Large)   Pulse (!) 57   Temp 98.2 F (36.8 C) (Oral)   Resp 18   Ht 5' 7 (1.702 m)   Wt 185 lb 3.2 oz (84 kg)   LMP 01/26/2012   SpO2 96%   BMI 29.01 kg/m  Wt Readings from Last 3 Encounters:  11/03/24 185 lb 3.2 oz (84 kg)  06/16/24 183 lb (83 kg)  05/06/24 180 lb (81.6 kg)    Diabetic Foot Exam -  Simple   No data filed    Lab Results  Component Value Date   WBC 7.1 06/16/2024   HGB 13.1 06/16/2024   HCT 39.2 06/16/2024   PLT 278 06/16/2024   GLUCOSE 97 10/22/2023   CHOL 118 10/22/2023   TRIG 77.0 10/22/2023   HDL 39.70 10/22/2023   LDLCALC 63 10/22/2023   ALT 16 10/22/2023   AST 14 10/22/2023   NA 142 10/22/2023   K 4.1 10/22/2023   CL 107 10/22/2023   CREATININE 0.71 10/22/2023   BUN 18 10/22/2023   CO2  28 10/22/2023   TSH 0.84 10/22/2023   HGBA1C 6.0 10/22/2023   MICROALBUR 0.4 09/16/2021    Lab Results  Component Value Date   TSH 0.84 10/22/2023   Lab Results  Component Value Date   WBC 7.1 06/16/2024   HGB 13.1 06/16/2024   HCT 39.2 06/16/2024   MCV 91.4 06/16/2024   PLT 278 06/16/2024   Lab Results  Component Value Date   NA 142 10/22/2023   K 4.1 10/22/2023   CO2 28 10/22/2023   GLUCOSE 97 10/22/2023   BUN 18 10/22/2023   CREATININE 0.71 10/22/2023   BILITOT 0.5 10/22/2023   ALKPHOS 107 10/22/2023   AST 14 10/22/2023   ALT 16 10/22/2023   PROT 6.2 10/22/2023   ALBUMIN  3.9 10/22/2023   CALCIUM  8.6 10/22/2023   ANIONGAP 6 12/11/2022   GFR 89.81 10/22/2023   Lab Results  Component Value Date   CHOL 118 10/22/2023   Lab Results  Component Value Date   HDL 39.70 10/22/2023   Lab Results  Component Value Date   LDLCALC 63 10/22/2023   Lab Results  Component Value Date   TRIG 77.0 10/22/2023   Lab Results  Component Value Date   CHOLHDL 3 10/22/2023   Lab Results  Component Value Date   HGBA1C 6.0 10/22/2023       Assessment & Plan:  Hyperlipidemia, unspecified hyperlipidemia type -     CBC with Differential/Platelet -     Comprehensive metabolic panel with GFR -     Lipid panel -     Atorvastatin  Calcium ; Take 1 tablet (40 mg total) by mouth daily.  Dispense: 90 tablet; Refill: 1  Palpitations -     ALPRAZolam ; TAKE 1/2 TO 1 TABLET BY MOUTH TWICE A DAY AS NEEDED  Dispense: 30 tablet; Refill: 1  Hypothyroidism, unspecified type  Hyperglycemia -     Comprehensive metabolic panel with GFR  Atrial fibrillation, unspecified type [facility])  Essential hypertension Assessment & Plan: Well controlled, no changes to meds. Encouraged heart healthy diet such as the DASH diet and exercise as tolerated.     History of ischemic stroke Assessment & Plan: Resolved-- no residual deficits    Assessment and Plan Assessment & Plan Hyperlipidemia    Currently managed with Lipitor. She inquired about discontinuing or reducing the dose. Medication adjustment will depend on upcoming lab results. Ordered a lipid panel to assess current cholesterol levels. Continue Lipitor until lab results are reviewed.    Alexandria Obeso R Lowne Chase, DO     [1]  Allergies Allergen Reactions   Peanut-Containing Drug Products Swelling   "

## 2024-11-03 NOTE — Patient Instructions (Signed)
 Cholesterol Content in Foods Cholesterol is a waxy, fat-like substance that helps to carry fat in the blood. The body needs cholesterol in small amounts, but too much cholesterol can cause damage to the arteries and heart. What foods have cholesterol?  Cholesterol is found in animal-based foods, such as meat, seafood, and dairy. Generally, low-fat dairy and lean meats have less cholesterol than full-fat dairy and fatty meats. The milligrams of cholesterol per serving (mg per serving) of common cholesterol-containing foods are listed below. Meats and other proteins Egg -- one large whole egg has 186 mg. Veal shank -- 4 oz (113 g) has 141 mg. Lean ground [place] (93% lean) -- 4 oz (113 g) has 118 mg. Fat-trimmed lamb loin -- 4 oz (113 g) has 106 mg. Lean ground beef (90% lean) -- 4 oz (113 g) has 100 mg. Lobster -- 3.5 oz (99 g) has 90 mg. Pork loin chops -- 4 oz (113 g) has 86 mg. Canned salmon -- 3.5 oz (99 g) has 83 mg. Fat-trimmed beef top loin -- 4 oz (113 g) has 78 mg. Frankfurter -- 1 frank (3.5 oz or 99 g) has 77 mg. Crab -- 3.5 oz (99 g) has 71 mg. Roasted chicken without skin, white meat -- 4 oz (113 g) has 66 mg. Light bologna -- 2 oz (57 g) has 45 mg. Deli-cut [place] -- 2 oz (57 g) has 31 mg. Canned tuna -- 3.5 oz (99 g) has 31 mg. Tomasa Blase -- 1 oz (28 g) has 29 mg. Oysters and mussels (raw) -- 3.5 oz (99 g) has 25 mg. Mackerel -- 1 oz (28 g) has 22 mg. Trout -- 1 oz (28 g) has 20 mg. Pork sausage -- 1 link (1 oz or 28 g) has 17 mg. Salmon -- 1 oz (28 g) has 16 mg. Tilapia -- 1 oz (28 g) has 14 mg. Dairy Soft-serve ice cream --  cup (4 oz or 86 g) has 103 mg. Whole-milk yogurt -- 1 cup (8 oz or 245 g) has 29 mg. Cheddar cheese -- 1 oz (28 g) has 28 mg. American cheese -- 1 oz (28 g) has 28 mg. Whole milk -- 1 cup (8 oz or 250 mL) has 23 mg. 2% milk -- 1 cup (8 oz or 250 mL) has 18 mg. Cream cheese -- 1 tablespoon (Tbsp) (14.5 g) has 15 mg. Cottage cheese --  cup (4 oz or  113 g) has 14 mg. Low-fat (1%) milk -- 1 cup (8 oz or 250 mL) has 10 mg. Sour cream -- 1 Tbsp (12 g) has 8.5 mg. Low-fat yogurt -- 1 cup (8 oz or 245 g) has 8 mg. Nonfat Greek yogurt -- 1 cup (8 oz or 228 g) has 7 mg. Half-and-half cream -- 1 Tbsp (15 mL) has 5 mg. Fats and oils Cod liver oil -- 1 tablespoon (Tbsp) (13.6 g) has 82 mg. Butter -- 1 Tbsp (14 g) has 15 mg. Lard -- 1 Tbsp (12.8 g) has 14 mg. Bacon grease -- 1 Tbsp (12.9 g) has 14 mg. Mayonnaise -- 1 Tbsp (13.8 g) has 5-10 mg. Margarine -- 1 Tbsp (14 g) has 3-10 mg. The items listed above may not be a complete list of foods with cholesterol. Exact amounts of cholesterol in these foods may vary depending on specific ingredients and brands. Contact a dietitian for more information. What foods do not have cholesterol? Most plant-based foods do not have cholesterol unless you combine them with a food that has  cholesterol. Foods without cholesterol include: Grains and cereals. Vegetables. Fruits. Vegetable oils, such as olive, canola, and sunflower oil. Legumes, such as peas, beans, and lentils. Nuts and seeds. Egg whites. The items listed above may not be a complete list of foods that do not have cholesterol. Contact a dietitian for more information. Summary The body needs cholesterol in small amounts, but too much cholesterol can cause damage to the arteries and heart. Cholesterol is found in animal-based foods, such as meat, seafood, and dairy. Generally, low-fat dairy and lean meats have less cholesterol than full-fat dairy and fatty meats. This information is not intended to replace advice given to you by your health care provider. Make sure you discuss any questions you have with your health care provider. Document Revised: 03/04/2021 Document Reviewed: 03/04/2021 Elsevier Patient Education  2024 [employer].

## 2024-11-03 NOTE — Assessment & Plan Note (Signed)
 Resolved-- no residual deficits

## 2024-11-03 NOTE — Assessment & Plan Note (Signed)
 Well controlled, no changes to meds. Encouraged heart healthy diet such as the DASH diet and exercise as tolerated.

## 2024-11-04 LAB — CBC WITH DIFFERENTIAL/PLATELET
Basophils Absolute: 0 K/uL (ref 0.0–0.1)
Basophils Relative: 0.2 % (ref 0.0–3.0)
Eosinophils Absolute: 0.1 K/uL (ref 0.0–0.7)
Eosinophils Relative: 1.3 % (ref 0.0–5.0)
HCT: 40.1 % (ref 36.0–46.0)
Hemoglobin: 13.7 g/dL (ref 12.0–15.0)
Lymphocytes Relative: 18.1 % (ref 12.0–46.0)
Lymphs Abs: 1.1 K/uL (ref 0.7–4.0)
MCHC: 34.3 g/dL (ref 30.0–36.0)
MCV: 89.2 fl (ref 78.0–100.0)
Monocytes Absolute: 0.4 K/uL (ref 0.1–1.0)
Monocytes Relative: 6.2 % (ref 3.0–12.0)
Neutro Abs: 4.5 K/uL (ref 1.4–7.7)
Neutrophils Relative %: 74.2 % (ref 43.0–77.0)
Platelets: 258 K/uL (ref 150.0–400.0)
RBC: 4.49 Mil/uL (ref 3.87–5.11)
RDW: 13.4 % (ref 11.5–15.5)
WBC: 6.1 K/uL (ref 4.0–10.5)

## 2024-11-04 LAB — COMPREHENSIVE METABOLIC PANEL WITH GFR
ALT: 17 U/L (ref 3–35)
AST: 16 U/L (ref 5–37)
Albumin: 4.3 g/dL (ref 3.5–5.2)
Alkaline Phosphatase: 92 U/L (ref 39–117)
BUN: 18 mg/dL (ref 6–23)
CO2: 30 meq/L (ref 19–32)
Calcium: 9.1 mg/dL (ref 8.4–10.5)
Chloride: 104 meq/L (ref 96–112)
Creatinine, Ser: 0.91 mg/dL (ref 0.40–1.20)
GFR: 66.19 mL/min
Glucose, Bld: 96 mg/dL (ref 70–99)
Potassium: 3.7 meq/L (ref 3.5–5.1)
Sodium: 142 meq/L (ref 135–145)
Total Bilirubin: 0.5 mg/dL (ref 0.2–1.2)
Total Protein: 7.3 g/dL (ref 6.0–8.3)

## 2024-11-04 LAB — LIPID PANEL
Cholesterol: 114 mg/dL (ref 28–200)
HDL: 39.8 mg/dL
LDL Cholesterol: 56 mg/dL (ref 10–99)
NonHDL: 74.05
Total CHOL/HDL Ratio: 3
Triglycerides: 91 mg/dL (ref 10.0–149.0)
VLDL: 18.2 mg/dL (ref 0.0–40.0)

## 2024-11-06 ENCOUNTER — Ambulatory Visit: Payer: Self-pay | Admitting: Family Medicine

## 2024-11-20 ENCOUNTER — Encounter: Payer: Self-pay | Admitting: Family

## 2024-11-24 ENCOUNTER — Encounter (HOSPITAL_COMMUNITY): Payer: Self-pay | Admitting: Physician Assistant

## 2024-11-24 ENCOUNTER — Ambulatory Visit (HOSPITAL_COMMUNITY)
Admission: RE | Admit: 2024-11-24 | Discharge: 2024-11-24 | Disposition: A | Discharge status: Home / Self Care | Source: Ambulatory Visit | Attending: Physician Assistant | Admitting: Physician Assistant

## 2024-11-24 VITALS — BP 124/76 | Ht 67.0 in | Wt 187.7 lb

## 2024-11-24 DIAGNOSIS — D6869 Other thrombophilia: Secondary | ICD-10-CM

## 2024-11-24 DIAGNOSIS — Z5181 Encounter for therapeutic drug level monitoring: Secondary | ICD-10-CM

## 2024-11-24 DIAGNOSIS — I480 Paroxysmal atrial fibrillation: Secondary | ICD-10-CM

## 2024-11-24 DIAGNOSIS — Z79899 Other long term (current) drug therapy: Secondary | ICD-10-CM | POA: Diagnosis not present

## 2024-11-24 MED ORDER — DILTIAZEM HCL 30 MG PO TABS: ORAL_TABLET | ORAL | 1 refills | Status: AC

## 2024-11-24 NOTE — Progress Notes (Signed)
 "   Primary Care Physician: Antonio Meth, Jamee SAUNDERS, DO Primary Cardiologist: none Primary Electrophysiologist: Dr Nancey  Referring Physician: Dr Kelsie Slater [name] [place] is a 64 y.o. female with a history of Graves disease, hiatal hernia, HLD, HTN, CVA, atrial fibrillation who presents for follow up in the [facility]. The patient was initially diagnosed with atrial fibrillation 2018/2019 and underwent afib ablation 08/2018 with Dr Kelsie. She has been maintained on flecainide . Patient is on Eliquis  for stroke prevention. Flecainide  discontinued 12/2023 as she was maintaining SR. She had palpitations after stopping flecainide  and resumed the medication.  Patient returns for follow up for atrial fibrillation and flecainide  monitoring. She is in SR today and feels well. She did have one episode of afib lasting ~3 hours last week. She admits she was under stress at that time but there were no other specific triggers. No bleeding issues on anticoagulation.   Today, she  denies symptoms of palpitations, chest pain, shortness of breath, orthopnea, PND, lower extremity edema, dizziness, presyncope, syncope, snoring, daytime somnolence, bleeding, or neurologic sequela. The patient is tolerating medications without difficulties and is otherwise without complaint today.    Atrial Fibrillation Risk Factors:  she does not have symptoms or diagnosis of sleep apnea. she does not have a history of rheumatic fever.   Atrial Fibrillation Management history:  Previous antiarrhythmic drugs: flecainide   Previous cardioversions: none Previous ablations: 2019 Anticoagulation history: Eliquis    Past Medical History:  Diagnosis Date   Anemia    hx of iron  deficiency anemia (pt had iron  infusions)   Anxiety    Atrial fibrillation (HCC)    Depression    GERD (gastroesophageal reflux disease)    Graves disease    Hiatal hernia    a. seen on CT 12/2016.   Hyperlipidemia     Hypertension    Hypothyroidism    pt had radioiodine treatment on thyroid    Low iron     pt received iron  infusions 08/2022   NSVT (nonsustained ventricular tachycardia) (HCC)    Pulmonary nodules    a. seen on CT 12/2016.   Stroke [facility])    TIA (transient ischemic attack) 01/2017   a. tx at [facility].    Current Outpatient Medications  Medication Sig Dispense Refill   acetaminophen  (TYLENOL ) 500 MG tablet Take 500 mg by mouth every 6 (six) hours as needed for moderate pain or headache. (Patient taking differently: Take 500 mg by mouth as needed for moderate pain (pain score 4-6) or headache.)     ALPRAZolam  (XANAX ) 0.5 MG tablet TAKE 1/2 TO 1 TABLET BY MOUTH TWICE A DAY AS NEEDED 30 tablet 1   apixaban  (ELIQUIS ) 5 MG TABS tablet Take 1 tablet (5 mg total) by mouth 2 (two) times daily. 180 tablet 3   atorvastatin  (LIPITOR) 40 MG tablet Take 1 tablet (40 mg total) by mouth daily. 90 tablet 1   dicyclomine  (BENTYL ) 10 MG capsule TAKE 1 CAPSULE (10 MG TOTAL) BY MOUTH AS NEEDED. 90 capsule 0   diltiazem  (CARDIZEM  CD) 240 MG 24 hr capsule TAKE 1 CAPSULE BY MOUTH EVERY DAY 90 capsule 2   famotidine  (PEPCID ) 40 MG tablet TAKE 1 TABLET BY MOUTH EVERYDAY AT BEDTIME (Patient taking differently: Take 40 mg by mouth as needed.) 90 tablet 1   flecainide  (TAMBOCOR ) 50 MG tablet TAKE 1 TABLET BY MOUTH TWICE A DAY 180 tablet 1   levothyroxine  (SYNTHROID ) 125 MCG tablet Take 125 mcg by mouth every morning.  diltiazem  (CARDIZEM ) 30 MG tablet Take 1 tablet every 4 hours AS NEEDED for AFIB heart rate >100 as long as top BP >100. 30 tablet 1   No current facility-administered medications for this encounter.    ROS- All systems are reviewed and negative except as per the HPI above.  Physical Exam: Vitals:   11/24/24 1127  BP: 124/76  SpO2: 94%  Weight: 85.1 kg  Height: 5' 7 (1.702 m)     GEN: Well nourished, well developed in no acute distress CARDIAC: Regular rate and rhythm, no murmurs,  rubs, gallops RESPIRATORY:  Clear to auscultation without rales, wheezing or rhonchi  ABDOMEN: Soft, non-tender, non-distended EXTREMITIES:  No edema; No deformity    Wt Readings from Last 3 Encounters:  11/24/24 85.1 kg  11/03/24 84 kg  06/16/24 83 kg    EKG Interpretation Date/Time:  Monday November 24 2024 11:23:33 EST Ventricular Rate:  55 PR Interval:  202 QRS Duration:  82 QT Interval:  436 QTC Calculation: 417 R Axis:   16  Text Interpretation: Sinus bradycardia Minimal voltage criteria for LVH, may be normal variant ( R in aVL ) Nonspecific ST abnormality Abnormal ECG When compared with ECG of 06-May-2024 11:19, No significant change was found Confirmed by Amahri Dengel ([id number]) on 11/24/2024 11:34:33 AM    Echo 04/05/18 demonstrated  - Left ventricle: The cavity size was normal. Systolic function was    normal. The estimated ejection fraction was in the range of 60%    to 65%. Wall motion was normal; there were no regional wall    motion abnormalities. Left ventricular diastolic function    parameters were normal.  - Mitral valve: There was mild regurgitation.  - Left atrium: The atrium was mildly dilated.  - Atrial septum: No defect or patent foramen ovale was identified.  - Impressions: Appears to be some shadowing artifact in LA on    subcostal images.   Epic records are reviewed at length today   CHA2DS2-VASc Score = 4  The patient's score is based upon: CHF History: 0 HTN History: 1 Diabetes History: 0 Stroke History: 2 Vascular Disease History: 0 Age Score: 0 Gender Score: 1       ASSESSMENT AND PLAN: Paroxysmal Atrial Fibrillation (ICD10:  I48.0) The patient's CHA2DS2-VASc score is 4, indicating a 4.8% annual risk of stroke.   S/p afib ablation 2019 with Dr Kelsie Patient appears to be maintaining SR. She does not feel her present afib burden requires any changes. If she has more afib, she would consider repeat ablation.  Continue flecainide  50 mg  BID Continue diltiazem  240 mg daily with 30 mg PRN q 4 hours for heart racing.  Continue Eliquis  5 mg BID  Secondary Hypercoagulable State (ICD10:  D68.69) The patient is at significant risk for stroke/thromboembolism based upon her CHA2DS2-VASc Score of 4.  Continue Apixaban  (Eliquis ). No bleeding issues.   High Risk Medication Monitoring (ICD 10: [id number]) Patient requires ongoing monitoring for anti-arrhythmic medication which has the potential to cause life threatening arrhythmias. Intervals on ECG acceptable for flecainide  monitoring.     HTN Stable on current regimen   Follow up in the AF clinic in 6 months.    Daril Kicks PA-C [facility] [address] [place], [place] [address] [phone] 11/24/2024 11:50 AM  "

## 2025-05-05 ENCOUNTER — Encounter: Admitting: Family Medicine

## 2025-05-25 ENCOUNTER — Ambulatory Visit (HOSPITAL_COMMUNITY): Admitting: Physician Assistant
